# Patient Record
Sex: Male | Born: 1955 | Race: Black or African American | Hispanic: No | Marital: Single | Smoking: Former smoker
Health system: Southern US, Community
[De-identification: ages and names within clinical notes are randomized; demographics above are authoritative.]

## PROBLEM LIST (undated history)

## (undated) DIAGNOSIS — I10 Essential (primary) hypertension: Secondary | ICD-10-CM

## (undated) DIAGNOSIS — K7460 Unspecified cirrhosis of liver: Secondary | ICD-10-CM

## (undated) DIAGNOSIS — Z992 Dependence on renal dialysis: Secondary | ICD-10-CM

## (undated) DIAGNOSIS — N189 Chronic kidney disease, unspecified: Secondary | ICD-10-CM

## (undated) DIAGNOSIS — E785 Hyperlipidemia, unspecified: Secondary | ICD-10-CM

## (undated) DIAGNOSIS — K838 Other specified diseases of biliary tract: Secondary | ICD-10-CM

## (undated) DIAGNOSIS — G4730 Sleep apnea, unspecified: Secondary | ICD-10-CM

## (undated) DIAGNOSIS — B1920 Unspecified viral hepatitis C without hepatic coma: Secondary | ICD-10-CM

## (undated) DIAGNOSIS — K219 Gastro-esophageal reflux disease without esophagitis: Secondary | ICD-10-CM

## (undated) DIAGNOSIS — M109 Gout, unspecified: Secondary | ICD-10-CM

## (undated) DIAGNOSIS — I517 Cardiomegaly: Secondary | ICD-10-CM

## (undated) DIAGNOSIS — I471 Supraventricular tachycardia, unspecified: Secondary | ICD-10-CM

## (undated) DIAGNOSIS — N186 End stage renal disease: Secondary | ICD-10-CM

## (undated) HISTORY — PX: COLONOSCOPY: SHX174

## (undated) HISTORY — DX: Unspecified viral hepatitis C without hepatic coma: B19.20

## (undated) HISTORY — PX: BACK SURGERY: SHX140

## (undated) HISTORY — DX: Supraventricular tachycardia, unspecified: I47.10

## (undated) HISTORY — DX: Cardiomegaly: I51.7

## (undated) HISTORY — PX: LIVER BIOPSY: SHX301

## (undated) HISTORY — PX: FEMUR FRACTURE SURGERY: SHX633

## (undated) HISTORY — DX: Gastro-esophageal reflux disease without esophagitis: K21.9

## (undated) HISTORY — DX: Unspecified cirrhosis of liver: K74.60

## (undated) HISTORY — DX: Other specified diseases of biliary tract: K83.8

## (undated) HISTORY — DX: Chronic kidney disease, unspecified: N18.9

## (undated) HISTORY — PX: PERITONEAL CATHETER INSERTION: SHX2223

## (undated) HISTORY — DX: Hyperlipidemia, unspecified: E78.5

## (undated) HISTORY — DX: Supraventricular tachycardia: I47.1

---

## 2020-01-27 ENCOUNTER — Emergency Department (HOSPITAL_COMMUNITY): Payer: Medicaid Other

## 2020-01-27 ENCOUNTER — Other Ambulatory Visit: Payer: Self-pay

## 2020-01-27 ENCOUNTER — Encounter (HOSPITAL_COMMUNITY): Payer: Self-pay | Admitting: Emergency Medicine

## 2020-01-27 ENCOUNTER — Emergency Department (HOSPITAL_COMMUNITY)
Admission: EM | Admit: 2020-01-27 | Discharge: 2020-01-27 | Disposition: A | Discharge status: Home / Self Care | Payer: Medicaid Other | Attending: Emergency Medicine | Admitting: Emergency Medicine

## 2020-01-27 DIAGNOSIS — R1010 Upper abdominal pain, unspecified: Secondary | ICD-10-CM | POA: Diagnosis not present

## 2020-01-27 DIAGNOSIS — Z79899 Other long term (current) drug therapy: Secondary | ICD-10-CM | POA: Insufficient documentation

## 2020-01-27 DIAGNOSIS — R109 Unspecified abdominal pain: Secondary | ICD-10-CM | POA: Diagnosis present

## 2020-01-27 DIAGNOSIS — Z7982 Long term (current) use of aspirin: Secondary | ICD-10-CM | POA: Diagnosis not present

## 2020-01-27 DIAGNOSIS — I10 Essential (primary) hypertension: Secondary | ICD-10-CM | POA: Diagnosis not present

## 2020-01-27 HISTORY — DX: Essential (primary) hypertension: I10

## 2020-01-27 LAB — CBC WITH DIFFERENTIAL/PLATELET
Abs Immature Granulocytes: 0.02 10*3/uL (ref 0.00–0.07)
Basophils Absolute: 0 10*3/uL (ref 0.0–0.1)
Basophils Relative: 0 %
Eosinophils Absolute: 0.2 10*3/uL (ref 0.0–0.5)
Eosinophils Relative: 3 %
HCT: 45.1 % (ref 39.0–52.0)
Hemoglobin: 15 g/dL (ref 13.0–17.0)
Immature Granulocytes: 0 %
Lymphocytes Relative: 17 %
Lymphs Abs: 1.2 10*3/uL (ref 0.7–4.0)
MCH: 30.4 pg (ref 26.0–34.0)
MCHC: 33.3 g/dL (ref 30.0–36.0)
MCV: 91.5 fL (ref 80.0–100.0)
Monocytes Absolute: 0.8 10*3/uL (ref 0.1–1.0)
Monocytes Relative: 11 %
Neutro Abs: 4.6 10*3/uL (ref 1.7–7.7)
Neutrophils Relative %: 69 %
Platelets: 169 10*3/uL (ref 150–400)
RBC: 4.93 MIL/uL (ref 4.22–5.81)
RDW: 12.7 % (ref 11.5–15.5)
WBC: 6.8 10*3/uL (ref 4.0–10.5)
nRBC: 0 % (ref 0.0–0.2)

## 2020-01-27 LAB — URINALYSIS, ROUTINE W REFLEX MICROSCOPIC
Bilirubin Urine: NEGATIVE
Glucose, UA: NEGATIVE mg/dL
Hgb urine dipstick: NEGATIVE
Ketones, ur: NEGATIVE mg/dL
Leukocytes,Ua: NEGATIVE
Nitrite: NEGATIVE
Protein, ur: 100 mg/dL — AB
Specific Gravity, Urine: 1.013 (ref 1.005–1.030)
pH: 7 (ref 5.0–8.0)

## 2020-01-27 LAB — COMPREHENSIVE METABOLIC PANEL
ALT: 33 U/L (ref 0–44)
AST: 35 U/L (ref 15–41)
Albumin: 3.9 g/dL (ref 3.5–5.0)
Alkaline Phosphatase: 55 U/L (ref 38–126)
Anion gap: 10 (ref 5–15)
BUN: 24 mg/dL — ABNORMAL HIGH (ref 8–23)
CO2: 24 mmol/L (ref 22–32)
Calcium: 9.2 mg/dL (ref 8.9–10.3)
Chloride: 101 mmol/L (ref 98–111)
Creatinine, Ser: 1.87 mg/dL — ABNORMAL HIGH (ref 0.61–1.24)
GFR calc Af Amer: 44 mL/min — ABNORMAL LOW (ref >60)
GFR calc non Af Amer: 38 mL/min — ABNORMAL LOW (ref >60)
Glucose, Bld: 113 mg/dL — ABNORMAL HIGH (ref 70–99)
Potassium: 3.8 mmol/L (ref 3.5–5.1)
Sodium: 135 mmol/L (ref 135–145)
Total Bilirubin: 0.8 mg/dL (ref 0.3–1.2)
Total Protein: 8.7 g/dL — ABNORMAL HIGH (ref 6.5–8.1)

## 2020-01-27 LAB — LIPASE, BLOOD: Lipase: 61 U/L — ABNORMAL HIGH (ref 11–51)

## 2020-01-27 MED ORDER — PANTOPRAZOLE SODIUM 20 MG PO TBEC
20.0000 mg | DELAYED_RELEASE_TABLET | Freq: Every day | ORAL | 1 refills | Status: DC
Start: 2020-01-27 — End: 2020-02-01

## 2020-01-27 MED ORDER — IOHEXOL 300 MG/ML  SOLN
80.0000 mL | Freq: Once | INTRAMUSCULAR | Status: AC | PRN
  Administered 2020-01-27: 80 mL via INTRAVENOUS

## 2020-01-27 MED ORDER — PANTOPRAZOLE SODIUM 40 MG IV SOLR
40.0000 mg | Freq: Once | INTRAVENOUS | Status: AC
  Administered 2020-01-27: 15:00:00 40 mg via INTRAVENOUS
  Filled 2020-01-27: qty 40

## 2020-01-27 MED ORDER — ONDANSETRON HCL 4 MG/2ML IJ SOLN
4.0000 mg | Freq: Once | INTRAMUSCULAR | Status: AC
  Administered 2020-01-27: 15:00:00 4 mg via INTRAVENOUS
  Filled 2020-01-27: qty 2

## 2020-01-27 MED ORDER — SODIUM CHLORIDE 0.9 % IV BOLUS
1000.0000 mL | Freq: Once | INTRAVENOUS | Status: AC
  Administered 2020-01-27: 1000 mL via INTRAVENOUS

## 2020-01-27 MED ORDER — HYDROMORPHONE HCL 1 MG/ML IJ SOLN
1.0000 mg | Freq: Once | INTRAMUSCULAR | Status: AC
  Administered 2020-01-27: 15:00:00 1 mg via INTRAVENOUS
  Filled 2020-01-27: qty 1

## 2020-01-27 NOTE — ED Provider Notes (Signed)
Pt initially seen by Dr Roderic Palau.  Please see his note.  Labs reviewed.  Nl wbc.  Slight increase in lipase.  LFTs normal.  Cr increased.  UA without signs of infection.  CT scan findings  IMPRESSION:  1. Mild dilatation of the common bile duct measuring 10 mm.  Correlation with LFTs is recommended.  2. Nodular contour of the liver capsule compatible with cirrhosis.  3. Retroperitoneal lymphadenopathy, nonspecific.  4. Aortic Atherosclerosis (ICD10-I70.0).   Bile duct dilated but no abnormal lfts.  No acute findings to account for symptoms.  Will refer to GI as outpt.   Dorie Rank, MD 01/27/20 [phone]

## 2020-01-27 NOTE — Discharge Instructions (Signed)
The medications as prescribed.  Follow-up with a primary care doctor or GI doctor for further evaluation

## 2020-01-27 NOTE — ED Triage Notes (Signed)
Patient c/o abdominal pain for the past 4 days in the RUQ. Patient describes the pain as pressure.

## 2020-01-30 ENCOUNTER — Telehealth: Payer: Self-pay

## 2020-01-30 NOTE — Telephone Encounter (Signed)
Ok to schedule ov.  

## 2020-01-30 NOTE — Progress Notes (Signed)
Referring Provider:[place] [facility] Primary Care Physician:  Patient, No Pcp Per Primary Gastroenterologist:  Dr. Gala Romney  Chief Complaint  Patient presents with  . Abdominal Pain    Upper abd, x 1 week comes/goes. Went to ER 4/24. Started pantoprazole yesterday  . Constipation    will have some hard balls at time    HPI:   Joshua Maldonado is a 64 y.o. male presenting today at the request of [facility] for abdominal pain.   ED notes available were minimal. Suspect note may not be complete.  Reviewed labs and imaging.  Labs completed 01/27/2020: CBC within normal limits.  CMP remarkable for creatinine 1.87.  LFTs and electrolytes within normal limits.  Lipase slightly elevated at 61.  Urine analysis with rare bacteria.  CT A/P 01/27/2020 for upper abdominal pain x4 days: Impression: 1.  Mild dilation of the common bile duct measuring 10 mm. (Gallbladder unremarkable) 2.  Nodular contour of the liver capsule compatible with cirrhosis. 3.  Retroperitoneal lymphadenopathy, nonspecific. 4.  Atherosclerosis.  Today:   Abdominal Pain: Started 1 week ago. Upper abdomen, more in the epigastric area. Comes and goes. Through the day and night. Last an hour or so. Hasn't slept in 3-4 night. Gained about 30 lbs since November. Pain is sharp and pressure like. When eating, states it feels different. No dysphagia. States it is a full feeling. Sometimes he does have pain with eating. Will still have pain without eating. May go 2-3 hours without pain. 9/10 in severity. No nausea or vomiting. Reports feeling hot like a fever occasionally since abdominal pain started. Doesn't have a thermometer. Some chills. Some lightheadedness after trying to have a BM. No confusion or change in mental status. No yellowing of eyes. Urine is yellow. Decreased fluid intake. 1/2-3/4 liter a day. Eating once a day. Just started Protonix yesterday (prescribed by the ED). Felt pain worsened after taking the medication.   Takes  acetaminophen 3-4 times a day. 500 mg tablets. Taking this for abdominal pain. Helps. 400 mg Ibuprofen a couple times a day for about 3 months for back and hip pain.    Feels abdomen is distended. Has been bloated a couple months now. No lower abdominal pain.   History of heartburn/reflux. Occurs a couple times a week. On and off for years. Used to take prilosec in the past which worked well. Has been off of this for about 3-4 months.   Constipation: Used to have BMs 3 times a day now only having once a day if that. Change occurred 2-3 weeks ago. Stool are hard at times. Small balls at times. No blood in the stool. Had 1 dark stool 3 days ago but states it wasn't black. No pepto. No iron. Not taking anything to help with this.   Colonoscopy 2-3 years ago [facility] in [place]. Just moved to [facility] in November 2020.   Cirrhosis: Gained 30 lbs since November 2020. Bloating for the last couple of months. Admits to eating more and having a poor diet since moving to [place]. No swelling in LE. No bruising or bleeding. Was told he had cirrhosis a few years ago. History of Hep C. Treated with Harvoni about 3 years ago at [facility]. Hasn't been seen in about 3 years for this. Occasional alcohol use- a couple beers a week. Used to drink 6 pack a week about 1 year ago. No drug use. No known family history of liver disease or autoimmune conditions. No personal  history of autoimmune disease. Has had a liver biopsy at [facility]. Doesn't remember results.   No alcohol in the last week.   Past Medical History:  Diagnosis Date  . Cirrhosis ([place])    Per patient, diagnosed 3-4 years ago at [facility].  Reports liver biopsy at [facility].  Suspected to be secondary to hepatitis C.  . Hepatitis C    Per patient, s/p treatment with Harvoni at [facility]  . HLD (hyperlipidemia)   . Hypertension     Past Surgical History:  Procedure Laterality Date  . BACK SURGERY    . COLONOSCOPY    . LIVER BIOPSY       Current Outpatient Medications  Medication Sig Dispense Refill  . aspirin EC 81 MG tablet Take 81 mg by mouth daily.    [name] atorvastatin (LIPITOR) 10 MG tablet Take 10 mg by mouth daily.    [name] gabapentin (NEURONTIN) 600 MG tablet Take 600 mg by mouth 3 (three) times daily.    . hydrochlorothiazide (HYDRODIURIL) 25 MG tablet Take 25 mg by mouth daily.    [name] LISINOPRIL PO Take 1 tablet by mouth daily.    . pantoprazole (PROTONIX) 20 MG tablet Take 1 tablet (20 mg total) by mouth daily. 14 tablet 1   No current facility-administered medications for this visit.    Allergies as of 01/31/2020  . (No Known Allergies)    Family History  Problem Relation Age of Onset  . Colon cancer Neg Hx     Social History   Socioeconomic History  . Marital status: Single    Spouse name: Not on file  . Number of children: Not on file  . Years of education: Not on file  . Highest education level: Not on file  Occupational History  . Not on file  Tobacco Use  . Smoking status: Never Smoker  . Smokeless tobacco: Never Used  Substance and Sexual Activity  . Alcohol use: Yes    Alcohol/week: 5.0 standard drinks    Types: 3 Shots of liquor, 2 Cans of beer per week  . Drug use: Never  . Sexual activity: Not on file  Other Topics Concern  . Not on file  Social History Narrative  . Not on file   Social Determinants of Health   Financial Resource Strain:   . Difficulty of Paying Living Expenses:   Food Insecurity:   . Worried About Charity fundraiser in the Last Year:   . Arboriculturist in the Last Year:   Transportation Needs:   . Film/video editor (Medical):   [name] Lack of Transportation (Non-Medical):   Physical Activity:   . Days of Exercise per Week:   . Minutes of Exercise per Session:   Stress:   . Feeling of Stress :   Social Connections:   . Frequency of Communication with Friends and Family:   . Frequency of Social Gatherings with Friends and Family:   . Attends  Religious Services:   . Active Member of Clubs or Organizations:   . Attends Archivist Meetings:   [name] Marital Status:   Intimate Partner Violence:   . Fear of Current or Ex-Partner:   . Emotionally Abused:   [name] Physically Abused:   . Sexually Abused:     Review of Systems: Gen: See HPI CV: Denies chest pain.  Reports feeling his heart was beating fast last week when he was having pain. Resp: Denies shortness of breath at  rest.  Admits to shortness of breath with exertion.  No regular cough. GI: See HPI GU : Denies urinary burning, urinary frequency, urinary hesitancy MS: See HPI Psych: Denies depression or anxiety Heme: See HPI  Physical Exam: BP 140/86   Pulse 83   Temp (!) 97.3 F (36.3 C) (Oral)   Ht 5\' 7"  (1.702 m)   Wt 222 lb (100.7 kg)   BMI 34.77 kg/m  General:   Alert and oriented. Pleasant and cooperative. Well-nourished and well-developed.  No acute distress. Head:  Normocephalic and atraumatic. Eyes:  Without icterus, sclera clear and conjunctiva pink.  Ears:  Normal auditory acuity. Lungs:  Clear to auscultation bilaterally. No wheezes, rales, or rhonchi. No distress.  Heart:  S1, S2 present without murmurs appreciated.  Abdomen:  +BS.  Protuberant abdomen but soft.  Moderate to severe tenderness to palpation in the epigastric area and moderate tenderness to palpation in the RUQ. Minimal tenderness in the LUQ. No lower abdominal tenderness. No HSM noted. No guarding or rebound. No masses appreciated.  Rectal:  Deferred  Msk:  Symmetrical without gross deformities. Normal posture. Extremities:  Without edema. Neurologic:  Alert and  oriented x4;  grossly normal neurologically. Skin:  Intact without significant lesions or rashes. Psych: Normal mood and affect.

## 2020-01-30 NOTE — Telephone Encounter (Signed)
Pt is aware of OV tomorrow at 1030 with [facility]

## 2020-01-30 NOTE — Telephone Encounter (Signed)
Pt was seen in the ER and recommended to follow up with [place]. Pt has PCP appt on 5/12, but can't wait because of abd pain and wants to be seen by [place] asap. Can we accept him as a new patient

## 2020-01-31 ENCOUNTER — Other Ambulatory Visit: Payer: Self-pay

## 2020-01-31 ENCOUNTER — Telehealth: Payer: Self-pay

## 2020-01-31 ENCOUNTER — Ambulatory Visit: Payer: Medicaid Other | Admitting: Gastroenterology

## 2020-01-31 ENCOUNTER — Encounter: Payer: Self-pay | Admitting: Gastroenterology

## 2020-01-31 VITALS — BP 140/86 | HR 83 | Temp 97.3°F | Ht 67.0 in | Wt 222.0 lb

## 2020-01-31 DIAGNOSIS — R1010 Upper abdominal pain, unspecified: Secondary | ICD-10-CM | POA: Diagnosis not present

## 2020-01-31 DIAGNOSIS — R109 Unspecified abdominal pain: Secondary | ICD-10-CM | POA: Insufficient documentation

## 2020-01-31 DIAGNOSIS — K838 Other specified diseases of biliary tract: Secondary | ICD-10-CM

## 2020-01-31 DIAGNOSIS — K5900 Constipation, unspecified: Secondary | ICD-10-CM

## 2020-01-31 DIAGNOSIS — K7460 Unspecified cirrhosis of liver: Secondary | ICD-10-CM

## 2020-01-31 DIAGNOSIS — Z8619 Personal history of other infectious and parasitic diseases: Secondary | ICD-10-CM | POA: Insufficient documentation

## 2020-01-31 NOTE — Telephone Encounter (Addendum)
ASAP MRI/MRCP scheduled at [facility] for 02/11/20 (1st available) at 9:00am, arrive at 8:30am. NPO 4 hours before test. [facility] and tell them he's scheduled for outpatient MRI. (ASAP MRI scheduled at [facility] d/t [facility] is booked till end of May)  Called and informed pt of MRI appt.

## 2020-01-31 NOTE — Assessment & Plan Note (Addendum)
64 y.o. male who reports known history of cirrhosis for the last few years.  States this is secondary to hepatitis C which was treated with Harvoni at [facility] in [place], [place].  Reports hep C eradication.  Also reports having a liver biopsy.  Does not remember the results. Has not followed with anyone for cirrhosis in about 3 years.  Has never had an EGD.  Admits to drinking a couple beers a week.  Used to drink a sixpack a week.  Denies any history of drug use.  Not sure how he contracted hepatitis C.  No personal family history of autoimmune conditions or family history of liver disease.  Recent labs 01/27/2020 with LFTs and platelets within normal limits.  CT abdomen and pelvis 01/27/2020 for upper abdominal pain with nodular contour of the liver capsule compatible with cirrhosis, mild dilation of the CBD measuring 10 mm but gallbladder unremarkable.  No ascites.  Upper abdominal pain continues and as discussed below.  Admits to 30 pound weight gain since November 2020 and feels his abdomen is distended but attributes this to poor diet since moving to [place].  No swelling in the lower extremities, yellowing of the eyes, confusion, or easy bruising/bleeding.  On exam, his abdomen appears protuberant but is soft.  Tenderness palpation in the upper abdomen.  No lower extremity edema.  Request records from [facility] to review prior evaluation/treatments. Pursuing MRI/MRCP for upper abdominal pain/CBD dilation. Updating labs to calculate meld score. Updating HCVRNA to ensure eradication. Obtaining hepatitis A and B labs to evaluate for immunity. Advised to avoid alcohol for now. No more than 2 g of Tylenol daily. Low-sodium diet.  No more than 2 g daily. He will need upper endoscopy at some point to screen for esophageal varices.  This may also be needed to evaluate for his upper abdominal pain.  Will determine timing pending MRI results. Follow-up date to be determined following labs  and MRI.

## 2020-01-31 NOTE — Telephone Encounter (Signed)
MRI appt letter mailed to pt.

## 2020-01-31 NOTE — Assessment & Plan Note (Addendum)
Mild dilation of CBD measuring 10 mm on CT 01/27/2020.  Gallbladder unremarkable. Cirrhotic liver (chronic per patient). Normal appearing pancreas. No prior imaging to compare to.  Labs at that time with slight elevation of lipase at 61, LFTs within normal limits.CBC within normal limits. Patient reports new onset intermittent upper abdominal pain x1 week. 9/10 in severity.  Worsens with food but also occurs without eating. Denies nausea or vomiting.  Subjective intermittent fever at home but temperature is normal today.  Admits to drinking 1-2 beer a week. Ibuprofen 400 mg twice a day for about 3 months. Denies brbpr or melena.   Significance of biliary dilation as a contributor to his abdominal pain is not clear as his LFTs were within normal limits. However, query whether he has some microlithiasis or possibly a malignancy contributing to the dilation and upper abdominal pain. With slight elevation of lipase, can't rule out developing pancreatitis that may be related to the CBD dilation. He could very well have gastritis/PUD.   Pursue MRI/MRCP to evaluate CBD dilation.  May have to complete this without contrast due to elevated creatinine. Update CBC, CMP, lipase. Continue Protonix 20 mg daily for now (this was just started yesterday-prescribed by ED). May increase dosing pending labs and MRI.  Advised to follow a clear liquid diet for now. Focus on oral hydration. Drink enough water to keep urine pale yellow to clear.  Avoid all NSAIDs.  This includes ibuprofen, Aleve, Advil, and Goody powders. No alcohol for now.  Advised to proceed to the ED if he developed fever, worsening abdominal pain, or nausea/vomiting. Further recommendations to follow.

## 2020-01-31 NOTE — Assessment & Plan Note (Signed)
Patient reports history of hepatitis C s/p treatment with Harvoni through [facility] about 3 years ago.  States hepatitis was eradicated.  He does have cirrhosis which was presumed to be secondary to hep C.  Cirrhosis as addressed above.   Requesting records from [facility]. Updating HCVRNA to ensure eradication. Checking labs for hepatitis A and B immunity.

## 2020-01-31 NOTE — Progress Notes (Signed)
NO PCP PER PATIENT °

## 2020-01-31 NOTE — Patient Instructions (Addendum)
Please go to [facility] and have your blood work completed.  We are arranging for you to have an MRI of your abdomen to evaluate your dilated bile duct.  Continue taking Protonix 40 mg 30 minutes before breakfast.  Follow a clear liquid diet for now to see if this helps with your abdominal pain.  Focus on increasing your water intake.  Your kidney function was in the emergency room.  I have nothing to compare this to but it may be related to dehydration.  Avoid all NSAIDs.  This includes ibuprofen, Aleve, Advil, and Goody powders.  No alcohol for now.   May use Tylenol as needed.  Do not take more than 2 g daily due to your history of cirrhosis.  Follow a low-sodium diet.  No more than 2 g daily.  MiraLAX 1 capful (17 g) daily in 8 ounces of water.  I am requesting your records from [facility].  If you develop a fever, worsening abdominal pain, nausea or vomiting, you should proceed to the emergency room.  Further recommendations to follow labs and imaging.  Aliene Altes, PA-C [facility]

## 2020-01-31 NOTE — Assessment & Plan Note (Addendum)
Patient reports new onset intermittent upper abdominal pain x1 week. 9/10 in severity.  Worsens with food but also occurs without eating. Denies nausea or vomiting.  Subjective intermittent fever at home but temperature is normal today.  Admits to drinking 1-2 beer a week. Ibuprofen 400 mg twice a day for about 3 months. Chronic history of intermittent GERD for which he had been on prilosec but has been off PPI for 3-4 months. GERD symptoms 2-3 times a week. Denies brbpr or melena. Evaluation in the ED on 01/27/20. CT A/P with contrast revealed Mild dilation of CBD measuring 10 mm, Gallbladder unremarkable, Cirrhotic liver (chronic per patient), normal appearing pancreas, no acute bowel findings. No prior imaging to compare to.  Labs at that time with slight elevation of lipase at 61, LFTs within normal limits. CBC within normal limits.   Differentials include pain related to CBD dilation possibly secondary to microlithiasis, can't rule out malignancy. He may be developing pancreatitis secondary to etiology of dilated biliary tree. May also have gastritis/PUD secondary to NSAIDs.   Will start with MRI/MRCP ASAP to evaluate CBD dilation. May need reduced contrast due to elevated creatinine.  Ultimately he will need EGD for esophageal variceal screening and may need this sooner than later to evaluate his upper abdominal pain pending MRI findings.  Update CBC, CMP, and Lipase Continue Protonix 20 mg daily (this was just started yesterday-prescribed by ED). May increase dosing pending labs and MRI.  Advised to follow a clear liquid diet for now. Focus on oral hydration. Drink enough water to keep urine pale yellow to clear.  Avoid all NSAIDs.  This includes ibuprofen, Aleve, Advil, and Goody powders. No alcohol for now.  Advised to proceed to the ED if he developed fever, worsening abdominal pain, or nausea/vomiting. Further recommendations to follow.

## 2020-01-31 NOTE — Assessment & Plan Note (Signed)
New onset of constipation about 3 weeks ago.  Went from having 3 BMs daily to 1 BM daily.  Stools are hard at times, require straining, and passes small stool balls.  Denies bright red blood per rectum.  Reports 1 dark stool but no black stool.  No unintentional weight loss.  No lower abdominal pain.  He does have new onset upper abdominal pain x1 week with decreased oral intake which may be influencing constipation.  Upper abdominal pain discussed below.  Reports colonoscopy 2-3 years ago at [facility] in [place].  Recent CT abdomen pelvis for upper abdominal pain with no concerning colonic findings.  Etiology of constipation is unclear.  May be influenced by decreased oral intake.  As he is without any alarm symptoms, has significant upper GI issues at this time, and has had recent colonoscopy, will plan to treat symptomatically for now.  Start MiraLAX 1 capful (a milligrams) daily in 8 ounces of water. Increase daily water intake.  Drink enough water to keep urine pale yellow to clear. We will determine follow-up pending labs and MRI for upper abdominal pain as discussed below.

## 2020-01-31 NOTE — Telephone Encounter (Signed)
PA for MRI/MRCP submitted via [facility] website. Case approved. PA# [id number], valid 01/31/20-07/29/20.

## 2020-02-01 ENCOUNTER — Telehealth: Payer: Self-pay | Admitting: Internal Medicine

## 2020-02-01 ENCOUNTER — Encounter: Payer: Self-pay | Admitting: *Deleted

## 2020-02-01 ENCOUNTER — Other Ambulatory Visit: Payer: Self-pay | Admitting: Gastroenterology

## 2020-02-01 DIAGNOSIS — R1010 Upper abdominal pain, unspecified: Secondary | ICD-10-CM

## 2020-02-01 MED ORDER — PANTOPRAZOLE SODIUM 40 MG PO TBEC: 40.0000 mg | DELAYED_RELEASE_TABLET | Freq: Two times a day (BID) | ORAL | 3 refills | Status: DC

## 2020-02-01 NOTE — Progress Notes (Signed)
Glucose is elevated at 261. Creatinine elevated at 1.84. I have nothing to compare this to. Patient denied any history of kidney disease at last visit.   He needs to establish with a PCP ASAP to have further evaluation/management of possible diabetes/kidney disease.   He is immune to hepatitis A.  Borderline immunity to hepatitis B.  Hepatitis C RNA is pending.  Liver function test within normal limits.  Lipase has increased compared to 5 days ago but not high enough to be diagnostic of acute pancreatitis.  This may be secondary to kidney disease or possibly peptic ulcer disease.  Cannot rule out contribution from dilated CBD for which he is currently scheduled for MRI; however, LFTs are within normal limits which makes this less likely.   For now, we will increase his Protonix to 40 mg twice daily to treat for possible PUD.  We will also go ahead and get him scheduled for an upper endoscopy to evaluate his upper abdominal pain as well as screen for esophageal varices.   Discussed results and recommendations with patient. Reported prior history of diabetes that had resolved. States he has appointment with [facility] 02/14/20 and will discuss kidney function and possible diabetes with them. He was agreeable to increasing protonix. Will send in Rx. He is also agreeable to EGD. The risks, benefits, and alternatives have been discussed in detail with patient.   [facility] Clinical Pool: Please arrange EGD with propofol with [facility]. Dx: Epigastric pain, cirrhosis.

## 2020-02-01 NOTE — Telephone Encounter (Signed)
Spoke with pt. Pt states his abdominal pain is severe compared to his pain on yesterday. Pt was asked if he is following a clear liquid diet, if he had a fever, severe abdominal pain. Pt stated he broke out into a sweat when he had a BM. Pt wasn't sure if he had a fever but his pain was worsening. Pt was advised to go to the ED if he developed any of the symptoms mentioned per [facility] at his ov.

## 2020-02-01 NOTE — Telephone Encounter (Signed)
Pt was seen in office yesterday by [facility]. He said he was still having abd pain. [phone]

## 2020-02-01 NOTE — Telephone Encounter (Signed)
Noted. Agree with recommendations to proceed to the ED for worsening abdominal pain, fever, nausea /vomiting. I had started MiraLAX 1 capful (17g) daily in 8 oz of water at his OV for constipation. Has he started this? This should start helping with hard stools and straining soon if it hasn't already.

## 2020-02-02 LAB — CBC WITH DIFFERENTIAL/PLATELET
Absolute Monocytes: 119 cells/uL — ABNORMAL LOW (ref 200–950)
Basophils Absolute: 7 cells/uL (ref 0–200)
Basophils Relative: 0.1 %
Eosinophils Absolute: 7 cells/uL — ABNORMAL LOW (ref 15–500)
Eosinophils Relative: 0.1 %
HCT: 44.2 % (ref 38.5–50.0)
Hemoglobin: 14.8 g/dL (ref 13.2–17.1)
Lymphs Abs: 770 cells/uL — ABNORMAL LOW (ref 850–3900)
MCH: 30.5 pg (ref 27.0–33.0)
MCHC: 33.5 g/dL (ref 32.0–36.0)
MCV: 91.1 fL (ref 80.0–100.0)
MPV: 11.5 fL (ref 7.5–12.5)
Monocytes Relative: 1.7 %
Neutro Abs: 6097 cells/uL (ref 1500–7800)
Neutrophils Relative %: 87.1 %
Platelets: 168 10*3/uL (ref 140–400)
RBC: 4.85 10*6/uL (ref 4.20–5.80)
RDW: 12.7 % (ref 11.0–15.0)
Total Lymphocyte: 11 %
WBC: 7 10*3/uL (ref 3.8–10.8)

## 2020-02-02 LAB — COMPLETE METABOLIC PANEL WITH GFR
AG Ratio: 1.1 (calc) (ref 1.0–2.5)
ALT: 25 U/L (ref 9–46)
AST: 24 U/L (ref 10–35)
Albumin: 4.1 g/dL (ref 3.6–5.1)
Alkaline phosphatase (APISO): 55 U/L (ref 35–144)
BUN/Creatinine Ratio: 15 (calc) (ref 6–22)
BUN: 27 mg/dL — ABNORMAL HIGH (ref 7–25)
CO2: 23 mmol/L (ref 20–32)
Calcium: 9.4 mg/dL (ref 8.6–10.3)
Chloride: 99 mmol/L (ref 98–110)
Creat: 1.84 mg/dL — ABNORMAL HIGH (ref 0.70–1.25)
GFR, Est African American: 45 mL/min/{1.73_m2} — ABNORMAL LOW (ref >=60)
GFR, Est Non African American: 39 mL/min/{1.73_m2} — ABNORMAL LOW (ref >=60)
Globulin: 3.7 g/dL (calc) (ref 1.9–3.7)
Glucose, Bld: 261 mg/dL — ABNORMAL HIGH (ref 65–139)
Potassium: 4.4 mmol/L (ref 3.5–5.3)
Sodium: 134 mmol/L — ABNORMAL LOW (ref 135–146)
Total Bilirubin: 0.5 mg/dL (ref 0.2–1.2)
Total Protein: 7.8 g/dL (ref 6.1–8.1)

## 2020-02-02 LAB — HEPATITIS B SURFACE ANTIBODY,QUALITATIVE: Hep B S Ab: BORDERLINE — AB

## 2020-02-02 LAB — HEPATITIS B SURFACE ANTIGEN: Hepatitis B Surface Ag: NONREACTIVE

## 2020-02-02 LAB — LIPASE: Lipase: 133 U/L — ABNORMAL HIGH (ref 7–60)

## 2020-02-02 LAB — PROTIME-INR
INR: 1
Prothrombin Time: 11.2 s (ref 9.0–11.5)

## 2020-02-02 LAB — HEPATITIS A ANTIBODY, TOTAL: Hepatitis A AB,Total: REACTIVE — AB

## 2020-02-02 LAB — HEPATITIS C RNA QUANTITATIVE
HCV Quantitative Log: <1.18 Log IU/mL
HCV RNA, PCR, QN: <15 IU/mL

## 2020-02-02 LAB — AFP TUMOR MARKER: AFP-Tumor Marker: 4.9 ng/mL (ref <6.1)

## 2020-02-02 NOTE — Telephone Encounter (Signed)
Spoke with pt. Pt is aware of [facility] recommendations. Pt started the Miralax 1 capful with 8 oz of water. Pt has had a small BM but feels he needs to completely empty out.

## 2020-02-02 NOTE — Telephone Encounter (Signed)
Noted. He should continue with MiraLAX daily.   If he wants to try a MiraLAX purge he can.  1 capful MiraLAX (17g) in 8 oz of water every hour up to 5 times.  Ok to stop before the 5th time if his bowels are moving well.

## 2020-02-02 NOTE — Telephone Encounter (Signed)
Left a detailed message for pt. Pt was advised to call back if needed.

## 2020-02-02 NOTE — Progress Notes (Signed)
Hep C RNA not detected.

## 2020-02-04 ENCOUNTER — Other Ambulatory Visit: Payer: Self-pay

## 2020-02-04 ENCOUNTER — Emergency Department (HOSPITAL_COMMUNITY)
Admission: EM | Admit: 2020-02-04 | Discharge: 2020-02-04 | Disposition: A | Discharge status: Home / Self Care | Payer: Medicaid Other | Attending: Emergency Medicine | Admitting: Emergency Medicine

## 2020-02-04 ENCOUNTER — Emergency Department (HOSPITAL_COMMUNITY): Payer: Medicaid Other

## 2020-02-04 ENCOUNTER — Encounter (HOSPITAL_COMMUNITY): Payer: Self-pay | Admitting: *Deleted

## 2020-02-04 ENCOUNTER — Telehealth: Payer: Self-pay | Admitting: Gastroenterology

## 2020-02-04 DIAGNOSIS — R1013 Epigastric pain: Secondary | ICD-10-CM | POA: Diagnosis present

## 2020-02-04 DIAGNOSIS — Z79899 Other long term (current) drug therapy: Secondary | ICD-10-CM | POA: Diagnosis not present

## 2020-02-04 DIAGNOSIS — I10 Essential (primary) hypertension: Secondary | ICD-10-CM | POA: Insufficient documentation

## 2020-02-04 DIAGNOSIS — Z7982 Long term (current) use of aspirin: Secondary | ICD-10-CM | POA: Insufficient documentation

## 2020-02-04 LAB — CBC WITH DIFFERENTIAL/PLATELET
Abs Immature Granulocytes: 0.03 10*3/uL (ref 0.00–0.07)
Basophils Absolute: 0 10*3/uL (ref 0.0–0.1)
Basophils Relative: 0 %
Eosinophils Absolute: 0.2 10*3/uL (ref 0.0–0.5)
Eosinophils Relative: 3 %
HCT: 44.8 % (ref 39.0–52.0)
Hemoglobin: 15.3 g/dL (ref 13.0–17.0)
Immature Granulocytes: 0 %
Lymphocytes Relative: 16 %
Lymphs Abs: 1.3 10*3/uL (ref 0.7–4.0)
MCH: 30.8 pg (ref 26.0–34.0)
MCHC: 34.2 g/dL (ref 30.0–36.0)
MCV: 90.3 fL (ref 80.0–100.0)
Monocytes Absolute: 0.6 10*3/uL (ref 0.1–1.0)
Monocytes Relative: 7 %
Neutro Abs: 5.9 10*3/uL (ref 1.7–7.7)
Neutrophils Relative %: 74 %
Platelets: 180 10*3/uL (ref 150–400)
RBC: 4.96 MIL/uL (ref 4.22–5.81)
RDW: 12.2 % (ref 11.5–15.5)
WBC: 8.1 10*3/uL (ref 4.0–10.5)
nRBC: 0 % (ref 0.0–0.2)

## 2020-02-04 LAB — HEPATIC FUNCTION PANEL
ALT: 31 U/L (ref 0–44)
AST: 32 U/L (ref 15–41)
Albumin: 4 g/dL (ref 3.5–5.0)
Alkaline Phosphatase: 67 U/L (ref 38–126)
Bilirubin, Direct: 0.1 mg/dL (ref 0.0–0.2)
Indirect Bilirubin: 0.5 mg/dL (ref 0.3–0.9)
Total Bilirubin: 0.6 mg/dL (ref 0.3–1.2)
Total Protein: 8.9 g/dL — ABNORMAL HIGH (ref 6.5–8.1)

## 2020-02-04 LAB — BASIC METABOLIC PANEL
Anion gap: 7 (ref 5–15)
BUN: 17 mg/dL (ref 8–23)
CO2: 25 mmol/L (ref 22–32)
Calcium: 9 mg/dL (ref 8.9–10.3)
Chloride: 101 mmol/L (ref 98–111)
Creatinine, Ser: 1.51 mg/dL — ABNORMAL HIGH (ref 0.61–1.24)
GFR calc Af Amer: 57 mL/min — ABNORMAL LOW (ref >60)
GFR calc non Af Amer: 49 mL/min — ABNORMAL LOW (ref >60)
Glucose, Bld: 109 mg/dL — ABNORMAL HIGH (ref 70–99)
Potassium: 3.9 mmol/L (ref 3.5–5.1)
Sodium: 133 mmol/L — ABNORMAL LOW (ref 135–145)

## 2020-02-04 LAB — LIPASE, BLOOD: Lipase: 115 U/L — ABNORMAL HIGH (ref 11–51)

## 2020-02-04 MED ORDER — HYDROCODONE-ACETAMINOPHEN 5-325 MG PO TABS: 2.0000 | ORAL_TABLET | Freq: Four times a day (QID) | ORAL | 0 refills | Status: DC | PRN

## 2020-02-04 MED ORDER — FENTANYL CITRATE (PF) 100 MCG/2ML IJ SOLN
50.0000 ug | Freq: Once | INTRAMUSCULAR | Status: AC
  Administered 2020-02-04: 14:00:00 50 ug via INTRAVENOUS
  Filled 2020-02-04: qty 2

## 2020-02-04 MED ORDER — PANTOPRAZOLE SODIUM 40 MG IV SOLR
40.0000 mg | Freq: Once | INTRAVENOUS | Status: AC
  Administered 2020-02-04: 40 mg via INTRAVENOUS
  Filled 2020-02-04: qty 40

## 2020-02-04 MED ORDER — HYDROMORPHONE HCL 1 MG/ML IJ SOLN
1.0000 mg | Freq: Once | INTRAMUSCULAR | Status: AC
  Administered 2020-02-04: 1 mg via INTRAVENOUS
  Filled 2020-02-04: qty 1

## 2020-02-04 NOTE — Discharge Instructions (Addendum)
Please follow-up with your gastroenterology PA regarding today's encounter and for ongoing evaluation and management.  I have prescribed you a short course of Vicodin.  Please only take for significant pain symptoms as this can exacerbate your symptoms of constipation.  Return to the ED or seek immediate medical attention should you experience any new or worsening symptoms.  You were given narcotic and or sedative medications while in the emergency department. Do not drive. Do not use machinery or power tools. Do not sign legal documents. Do not drink alcohol. Do not take sleeping pills. Do not supervise children by yourself. Do not participate in activities that require climbing or being in high places.

## 2020-02-04 NOTE — Telephone Encounter (Signed)
Received records from [facility] with dates ranging from 10/12/2013-09/26/2015.  -States patient has biopsy-proven cirrhosis likely secondary to hep C. -Hepatitis C genotype 1a failing treatment with telaprevir, pegylated interferon, and ribavirin.  Subsequently treated with Harvoni for 24 weeks starting 09/26/2013 and completed 03/20/2014 -Completed hepatitis A and B vaccine July 2015. -Reports patient had EGD in July 2011 without varices.  Colonoscopy in 2010 with colonic polyps. -Also following with [facility] (not sure if prior EGD/TCS was completed there).   No EGD/TCS report received. No results from Liver Biopsy received.   Manuela Schwartz can you request TCS/EGD and liver biopsy results from [facility] in [place], [place] as I am not sure where these procedures were performed.

## 2020-02-04 NOTE — ED Triage Notes (Signed)
Pt with upper abd pain across for 2 weeks, seen for same last weekend.  Pt has been taking miralax for constipation and having very small BM's.  LBM today but very small.

## 2020-02-04 NOTE — ED Provider Notes (Signed)
[facility] EMERGENCY DEPARTMENT Provider Note   CSN: [id number] Arrival date & time: 01/27/20  1032     History Chief Complaint  Patient presents with  . Abdominal Pain    Joshua Maldonado is a 64 y.o. male.   Abdominal Pain Associated symptoms: no chest pain, no cough, no diarrhea, no fatigue and no hematuria        Past Medical History:  Diagnosis Date  . Cirrhosis ([place])    Per patient, diagnosed 3-4 years ago at [facility]  Reports liver biopsy at [facility].  Suspected to be secondary to hepatitis C.  . Hepatitis C    Per patient, s/p treatment with Harvoni at [facility]  . HLD (hyperlipidemia)   . Hypertension     Patient Active Problem List   Diagnosis Date Noted  . History of hepatitis C 01/31/2020  . Cirrhosis of liver without ascites ([place]) 01/31/2020  . Dilation of biliary tract 01/31/2020  . Pain of upper abdomen 01/31/2020  . Constipation 01/31/2020    Past Surgical History:  Procedure Laterality Date  . BACK SURGERY    . COLONOSCOPY    . LIVER BIOPSY         Family History  Problem Relation Age of Onset  . Colon cancer Neg Hx     Social History   Tobacco Use  . Smoking status: Never Smoker  . Smokeless tobacco: Never Used  Substance Use Topics  . Alcohol use: Yes    Alcohol/week: 5.0 standard drinks    Types: 3 Shots of liquor, 2 Cans of beer per week  . Drug use: Never    Home Medications Prior to Admission medications   Medication Sig Start Date End Date Taking? Authorizing Provider  aspirin EC 81 MG tablet Take 81 mg by mouth daily.    [provider]  atorvastatin (LIPITOR) 10 MG tablet Take 10 mg by mouth daily.    [provider]  gabapentin (NEURONTIN) 600 MG tablet Take 600 mg by mouth 3 (three) times daily.    [provider]  hydrochlorothiazide (HYDRODIURIL) 25 MG tablet Take 25 mg by mouth daily.    [provider]  LISINOPRIL PO Take 1 tablet by mouth daily.    [provider]  pantoprazole (PROTONIX) 40 MG tablet Take 1 tablet (40 mg total) by mouth 2 (two) times daily before a meal. 02/01/20 01/31/21  Erenest Rasher, PA-C    Allergies    Patient has no known allergies.  Review of Systems   Review of Systems  Constitutional: Negative for appetite change and fatigue.  HENT: Negative for congestion, ear discharge and sinus pressure.   Eyes: Negative for discharge.  Respiratory: Negative for cough.   Cardiovascular: Negative for chest pain.  Gastrointestinal: Positive for abdominal pain. Negative for diarrhea.  Genitourinary: Negative for frequency and hematuria.  Musculoskeletal: Negative for back pain.  Skin: Negative for rash.  Neurological: Negative for seizures and headaches.  Psychiatric/Behavioral: Negative for hallucinations.    Physical Exam Updated Vital Signs BP (!) 140/104   Pulse 70   Temp 98.7 F (37.1 C) (Oral)   Resp 18   Ht 5\' 7"  (1.702 m)   Wt 90.7 kg   SpO2 97%   BMI 31.32 kg/m   Physical Exam Vitals reviewed.  Constitutional:      Appearance: He is well-developed.  HENT:     Head: Normocephalic.     Nose: Nose normal.  Eyes:  General: No scleral icterus.    Conjunctiva/sclera: Conjunctivae normal.  Neck:     Thyroid: No thyromegaly.  Cardiovascular:     Rate and Rhythm: Normal rate and regular rhythm.     Heart sounds: No murmur. No friction rub. No gallop.   Pulmonary:     Breath sounds: No stridor. No wheezing or rales.  Chest:     Chest wall: No tenderness.  Abdominal:     General: There is no distension.     Tenderness: There is abdominal tenderness. There is no rebound.     Comments: Mild epigastric tenderness  Musculoskeletal:        General: Normal range of motion.     Cervical back: Neck supple.  Lymphadenopathy:     Cervical: No cervical adenopathy.  Skin:    Findings: No erythema or rash.  Neurological:     Mental Status: He is alert and oriented to person, place, and time.      Motor: No abnormal muscle tone.     Coordination: Coordination normal.  Psychiatric:        Behavior: Behavior normal.     ED Results / Procedures / Treatments   Labs (all labs ordered are listed, but only abnormal results are displayed) Labs Reviewed  COMPREHENSIVE METABOLIC PANEL - Abnormal; Notable for the following components:      Result Value   Glucose, Bld 113 (*)    BUN 24 (*)    Creatinine, Ser 1.87 (*)    Total Protein 8.7 (*)    GFR calc non Af Amer 38 (*)    GFR calc Af Amer 44 (*)    All other components within normal limits  LIPASE, BLOOD - Abnormal; Notable for the following components:   Lipase 61 (*)    All other components within normal limits  URINALYSIS, ROUTINE W REFLEX MICROSCOPIC - Abnormal; Notable for the following components:   Protein, ur 100 (*)    Bacteria, UA RARE (*)    All other components within normal limits  CBC WITH DIFFERENTIAL/PLATELET    EKG None  Radiology No results found.  Procedures Procedures (including critical care time)  Medications Ordered in ED Medications  HYDROmorphone (DILAUDID) injection 1 mg (1 mg Intravenous Given 01/27/20 1451)  ondansetron (ZOFRAN) injection 4 mg (4 mg Intravenous Given 01/27/20 1453)  sodium chloride 0.9 % bolus 1,000 mL (0 mLs Intravenous Stopped 01/27/20 1714)  pantoprazole (PROTONIX) injection 40 mg (40 mg Intravenous Given 01/27/20 1459)  iohexol (OMNIPAQUE) 300 MG/ML solution 80 mL (80 mLs Intravenous Contrast Given 01/27/20 1537)    ED Course  I have reviewed the triage vital signs and the nursing notes.  Pertinent labs & imaging results that were available during my care of the patient were reviewed by me and considered in my medical decision making (see chart for details).    MDM Rules/Calculators/A&P                     Patient with history of cirrhosis.  Recent CT scan shows no acute problems.  Patient improved with Protonix.  He will be sent home with Protonix and follow-up with  his GI doctor    This patient presents to the ED for concern of abdominal pain, this involves an extensive number of treatment options, and is a complaint that carries with it a high risk of complications and morbidity.  The differential diagnosis includes cirrhosis GERD   Lab Tests:   I Ordered, reviewed, and  interpreted labs, which included CBC chemistries and lipase which showed mild elevated lipase  Medicines ordered:   I ordered medication Dilaudid and Protonix for abdominal pain.  Patient improved  Imaging Studies ordered:   Additional history obtained:   Additional history obtained from records  Previous records obtained and reviewed   Consultations Obtained:   Reevaluation:  After the interventions stated above, I reevaluated the patient and found improved  Critical Interventions:  .   Final Clinical Impression(s) / ED Diagnoses Final diagnoses:  Abdominal pain, unspecified abdominal location    Rx / DC Orders ED Discharge Orders         Ordered    pantoprazole (PROTONIX) 20 MG tablet  Daily,   Status:  Discontinued     01/27/20 1704           Milton Ferguson, MD 02/04/20 [phone]

## 2020-02-04 NOTE — ED Provider Notes (Signed)
[facility] EMERGENCY DEPARTMENT Provider Note   CSN: [id number] Arrival date & time: 02/04/20  1257     History Chief Complaint  Patient presents with  . Abdominal Pain    Joshua Maldonado is a 64 y.o. male with past medical history significant for hepatitis C, cirrhosis, HTN, HLD presents to the ED with a 2-week history of ongoing upper abdominal pain.  I reviewed patient's medical record and he was evaluated 01/27/2020 and CT abdomen pelvis with contrast was obtained and revealed no acute intra-abdominal pathology.  He improved with Dilaudid and Protonix.  CBD mildly dilated to 10 mm and contour of liver was consistent with cirrhosis.  Patient was seen by gastroenterology, Trellis Moment, and was concern for developing pancreatitis due to biliary tree or gastritis/PUD secondary to NSAID use.  Plan was for urgent MRI/MRCP to evaluate his CBD dilation further.  This is currently scheduled for 02/11/2020.  Advised to continue PPIs and stool softeners as well as increase hydration while avoiding NSAIDs.  On my examination patient states that his pain is about the same as when he came to the ER last week.  He continues to endorse early satiety and states that he has gained approximately 30 pounds in the past 6 months.  He has been having 1 small nonbloody bowel movement each day as opposed to 2-3 large bowel movements that he was having prior to his onset of symptoms.  He denies any headache or dizziness, fevers or chills, chest pain, nausea or vomiting, urinary symptoms, melena or hematochezia, or other symptoms.  HPI     Past Medical History:  Diagnosis Date  . Cirrhosis ([place])    Per patient, diagnosed 3-4 years ago at [facility].  Reports liver biopsy at [facility].  Suspected to be secondary to hepatitis C.  . Hepatitis C    Per patient, s/p treatment with Harvoni at [facility]  . HLD (hyperlipidemia)   . Hypertension     Patient Active Problem List   Diagnosis Date Noted  . History of  hepatitis C 01/31/2020  . Cirrhosis of liver without ascites ([place]) 01/31/2020  . Dilation of biliary tract 01/31/2020  . Pain of upper abdomen 01/31/2020  . Constipation 01/31/2020    Past Surgical History:  Procedure Laterality Date  . BACK SURGERY    . COLONOSCOPY    . LIVER BIOPSY         Family History  Problem Relation Age of Onset  . Colon cancer Neg Hx     Social History   Tobacco Use  . Smoking status: Never Smoker  . Smokeless tobacco: Never Used  Substance Use Topics  . Alcohol use: Yes    Alcohol/week: 5.0 standard drinks    Types: 3 Shots of liquor, 2 Cans of beer per week  . Drug use: Never    Home Medications Prior to Admission medications   Medication Sig Start Date End Date Taking? Authorizing Provider  aspirin EC 81 MG tablet Take 81 mg by mouth daily.   Yes [provider]  atorvastatin (LIPITOR) 10 MG tablet Take 10 mg by mouth daily.   Yes [provider]  gabapentin (NEURONTIN) 600 MG tablet Take 600 mg by mouth 3 (three) times daily.   Yes [provider]  hydrochlorothiazide (HYDRODIURIL) 25 MG tablet Take 25 mg by mouth daily.   Yes [provider]  lisinopril (ZESTRIL) 20 MG tablet Take 1 tablet by mouth daily.    Yes [provider]  pantoprazole (PROTONIX) 40 MG tablet Take 1 tablet (40 mg total) by mouth 2 (two) times daily before a meal. 02/01/20 01/31/21 Yes Harper, Kristen S, PA-C  polyethylene glycol (MIRALAX / GLYCOLAX) 17 g packet Take 17 g by mouth 2 (two) times daily.   Yes [provider]  HYDROcodone-acetaminophen (NORCO/VICODIN) 5-325 MG tablet Take 2 tablets by mouth every 6 (six) hours as needed for severe pain. 02/04/20   Corena Herter, PA-C    Allergies    Patient has no known allergies.  Review of Systems   Review of Systems  All other systems reviewed and are negative.   Physical Exam Updated Vital Signs BP (!) 158/110 (BP Location: Left Arm)   Pulse 70   Temp  97.9 F (36.6 C) (Oral)   Resp 15   Ht 5\' 7"  (1.702 m)   Wt 100.2 kg   SpO2 99%   BMI 34.61 kg/m   Physical Exam Vitals and nursing note reviewed. Exam conducted with a chaperone present.  Constitutional:      Appearance: Normal appearance.  HENT:     Head: Normocephalic and atraumatic.  Eyes:     General: No scleral icterus.    Conjunctiva/sclera: Conjunctivae normal.  Cardiovascular:     Rate and Rhythm: Normal rate and regular rhythm.     Pulses: Normal pulses.     Heart sounds: Normal heart sounds.  Pulmonary:     Effort: Pulmonary effort is normal. No respiratory distress.     Breath sounds: Normal breath sounds.     Comments: Mildly increased work of breathing. Abdominal:     Comments: Soft, mildly distended.  Significant TTP over epigastrium.  Mild TTP in RUQ.  No TTP elsewhere.  No guarding.  No overlying skin changes.  Normoactive bowel sounds.  Musculoskeletal:     Cervical back: Normal range of motion. No rigidity.     Right lower leg: No edema.     Left lower leg: No edema.  Skin:    General: Skin is dry.     Capillary Refill: Capillary refill takes less than 2 seconds.  Neurological:     Mental Status: He is alert and oriented to person, place, and time.     GCS: GCS eye subscore is 4. GCS verbal subscore is 5. GCS motor subscore is 6.  Psychiatric:        Mood and Affect: Mood normal.        Behavior: Behavior normal.        Thought Content: Thought content normal.     ED Results / Procedures / Treatments   Labs (all labs ordered are listed, but only abnormal results are displayed) Labs Reviewed  LIPASE, BLOOD - Abnormal; Notable for the following components:      Result Value   Lipase 115 (*)    All other components within normal limits  HEPATIC FUNCTION PANEL - Abnormal; Notable for the following components:   Total Protein 8.9 (*)    All other components within normal limits  BASIC METABOLIC PANEL - Abnormal; Notable for the following  components:   Sodium 133 (*)    Glucose, Bld 109 (*)    Creatinine, Ser 1.51 (*)    GFR calc non Af Amer 49 (*)    GFR calc Af Amer 57 (*)    All other components within normal limits  CBC WITH DIFFERENTIAL/PLATELET    EKG None  Radiology DG Chest Portable 1 View  Result Date: 02/04/2020 CLINICAL DATA:  Increased shortness of breath EXAM: PORTABLE CHEST 1 VIEW COMPARISON:  None. FINDINGS: Low lung volumes. Likely chronic mild interstitial prominence. No pleural effusion or pneumothorax. Cardiomediastinal contours are within normal limits technique. IMPRESSION: No acute process in the chest. Electronically Signed   By: Macy Mis M.D.   On: 02/04/2020 14:33    Procedures Procedures (including critical care time)  Medications Ordered in ED Medications  pantoprazole (PROTONIX) injection 40 mg (40 mg Intravenous Given 02/04/20 1424)  fentaNYL (SUBLIMAZE) injection 50 mcg (50 mcg Intravenous Given 02/04/20 1425)  HYDROmorphone (DILAUDID) injection 1 mg (1 mg Intravenous Given 02/04/20 1534)    ED Course  I have reviewed the triage vital signs and the nursing notes.  Pertinent labs & imaging results that were available during my care of the patient were reviewed by me and considered in my medical decision making (see chart for details).    MDM Rules/Calculators/A&P                      Patient reports that his pain symptoms are not any worse than what they were when he first came to the ER for evaluation approximately 1 week ago.  He was hoping that by coming here today he could potentially expedite his MRCP studies.  He is taking his medications at home, as directed.  He has an excellent relationship with his gastroenterology physician assistant who has been engaging in communication with him on a regular basis.  He states that he is still able to tolerate food and liquid by mouth, however endorses early satiety.  He is mildly hyponatremic to 133, but otherwise there is no significant  electrolyte derangement in his BMP.  He still demonstrates mildly impaired renal function, however much improved when compared to prior labs.  His hepatic function panel was surprisingly unremarkable and consistent with his prior labs.  While his lipase is elevated at 115, it is improved from 133 labs obtained a few days ago.  CBC remains entirely unremarkable.  His vital signs aside from elevated blood pressure, presumably due to his pain symptoms, are WNL.  We will prescribe patient a short course of pain medication, however I encouraged him to only take if absolutely necessary as it can exacerbate any symptoms of constipation.  Otherwise, encourage him to follow-up with his gastroenterology clinic first thing tomorrow morning to discuss whether or not his subsequent imaging can be expedited.  Do not feel as though repeat CT is warranted.  Patient agrees and is reassured by today's laboratory work-up.  Discussed with Dr. Rogene Houston who agrees with assessment and plan.  Strict ED return precautions discussed.  All of the evaluation and work-up results were discussed with the patient and any family at bedside. They were provided opportunity to ask any additional questions and have none at this time. They have expressed understanding of verbal discharge instructions as well as return precautions and are agreeable to the plan.   Final Clinical Impression(s) / ED Diagnoses Final diagnoses:  Epigastric pain    Rx / DC Orders ED Discharge Orders         Ordered    HYDROcodone-acetaminophen (NORCO/VICODIN) 5-325 MG tablet  Every 6 hours PRN     02/04/20 1615           Corena Herter, PA-C 02/04/20 1617    Fredia Sorrow, MD 02/08/20 2009

## 2020-02-06 ENCOUNTER — Telehealth: Payer: Self-pay | Admitting: Internal Medicine

## 2020-02-06 NOTE — Telephone Encounter (Signed)
Called pt and he has been provided # to central scheduling. He is scheduled for Sunday for MRI.

## 2020-02-06 NOTE — Telephone Encounter (Signed)
Patient called and said he was seen in the er for his symptoms and they suggested he call here and try and get his MRI moved to sooner.

## 2020-02-11 ENCOUNTER — Other Ambulatory Visit: Payer: Self-pay

## 2020-02-11 ENCOUNTER — Other Ambulatory Visit: Payer: Self-pay | Admitting: Gastroenterology

## 2020-02-11 ENCOUNTER — Ambulatory Visit (HOSPITAL_COMMUNITY)
Admission: RE | Admit: 2020-02-11 | Discharge: 2020-02-11 | Disposition: A | Discharge status: Home / Self Care | Payer: Medicaid Other | Source: Ambulatory Visit | Attending: Gastroenterology | Admitting: Gastroenterology

## 2020-02-11 DIAGNOSIS — R1010 Upper abdominal pain, unspecified: Secondary | ICD-10-CM

## 2020-02-11 DIAGNOSIS — K838 Other specified diseases of biliary tract: Secondary | ICD-10-CM

## 2020-02-11 NOTE — Progress Notes (Signed)
MRI completed to evaluate for CBD dilation noted on prior CT. Dilation is favored to represent a benign variant. No obstruction lesion within the CBD on MRI.   Do not suspect this is contributing to his upper abdominal pain. More likely gastritis or possible PUD secondary to ibuprofen as discussed prior. He should continue Protonix 40 mg BID and avoid all NSAIDs and alcohol for now.

## 2020-02-14 ENCOUNTER — Other Ambulatory Visit: Payer: Self-pay | Admitting: Gastroenterology

## 2020-02-14 DIAGNOSIS — R1010 Upper abdominal pain, unspecified: Secondary | ICD-10-CM

## 2020-02-14 MED ORDER — SUCRALFATE 1 G PO TABS: 1.0000 g | ORAL_TABLET | Freq: Three times a day (TID) | ORAL | 1 refills | Status: DC

## 2020-02-20 NOTE — Telephone Encounter (Signed)
Requested last TCS/EGD and path from both places.

## 2020-03-21 ENCOUNTER — Telehealth: Payer: Self-pay

## 2020-03-21 NOTE — Telephone Encounter (Signed)
Received a call from [facility] records. They didn't receive the full request for pts records. If records are still needed, you can call [phone] or fax [phone].

## 2020-04-03 NOTE — Patient Instructions (Signed)
42    Your procedure is scheduled on: 04/09/2020  Report to [facility] at   11:45  AM.  Call this number if you have problems the morning of surgery: [phone]   Remember:   Do not drink or eat food:After Midnight.        No Smoking the day of procedure      Take these medicines the morning of surgery with A SIP OF WATER: Pantoprazole and gabapentin   Do not wear jewelry, make-up or nail polish.  Do not wear lotions, powders, or perfumes. You may wear deodorant.                Do not bring valuables to the hospital.  Contacts, dentures or bridgework may not be worn into surgery.  Leave suitcase in the car. After surgery it may be brought to your room.  For patients admitted to the hospital, checkout time is 11:00 AM the day of discharge.   Patients discharged the day of surgery will not be allowed to drive home. Upper Endoscopy, Adult Upper endoscopy is a procedure to look inside the upper GI (gastrointestinal) tract. The upper GI tract is made up of:  The part of the body that moves food from your mouth to your stomach (esophagus).  The stomach.  The first part of your small intestine (duodenum). This procedure is also called esophagogastroduodenoscopy (EGD) or gastroscopy. In this procedure, your health care provider passes a thin, flexible tube (endoscope) through your mouth and down your esophagus into your stomach. A small camera is attached to the end of the tube. Images from the camera appear on a monitor in the exam room. During this procedure, your health care provider may also remove a small piece of tissue to be sent to a lab and examined under a microscope (biopsy). Your health care provider may do an upper endoscopy to diagnose cancers of the upper GI tract. You may also have this procedure to find the cause of other conditions, such as:  Stomach pain.  Heartburn.  Pain or problems when swallowing.  Nausea and vomiting.  Stomach bleeding.  Stomach ulcers. Tell a  health care provider about:  Any allergies you have.  All medicines you are taking, including vitamins, herbs, eye drops, creams, and over-the-counter medicines.  Any problems you or family members have had with anesthetic medicines.  Any blood disorders you have.  Any surgeries you have had.  Any medical conditions you have.  Whether you are pregnant or may be pregnant. What are the risks? Generally, this is a safe procedure. However, problems may occur, including:  Infection.  Bleeding.  Allergic reactions to medicines.  A tear or hole (perforation) in the esophagus, stomach, or duodenum. What happens before the procedure? Staying hydrated Follow instructions from your health care provider about hydration, which may include:  Up to 2 hours before the procedure - you may continue to drink clear liquids, such as water, clear fruit juice, black coffee, and plain tea.  Medicines Ask your health care provider about:  Changing or stopping your regular medicines. This is especially important if you are taking diabetes medicines or blood thinners.  Taking medicines such as aspirin and ibuprofen. These medicines can thin your blood. Do not take these medicines unless your health care provider tells you to take them.  Taking over-the-counter medicines, vitamins, herbs, and supplements. General instructions  Plan to have someone take you home from the hospital or clinic.  If you will be  going home right after the procedure, plan to have someone with you for 24 hours.  Ask your health care provider what steps will be taken to help prevent infection. What happens during the procedure?  1. An IV will be inserted into one of your veins. 2. You may be given one or more of the following: ? A medicine to help you relax (sedative). ? A medicine to numb the throat (local anesthetic). 3. You will lie on your left side on an exam table. 4. Your health care provider will pass the  endoscope through your mouth and down your esophagus. 5. Your health care provider will use the scope to check the inside of your esophagus, stomach, and duodenum. Biopsies may be taken. 6. The endoscope will be removed. The procedure may vary among health care providers and hospitals. What happens after the procedure?  Your blood pressure, heart rate, breathing rate, and blood oxygen level will be monitored until you leave the hospital or clinic.  Do not drive for 24 hours if you were given a sedative during your procedure.  When your throat is no longer numb, you may be given some fluids to drink.  It is up to you to get the results of your procedure. Ask your health care provider, or the department that is doing the procedure, when your results will be ready. Summary  Upper endoscopy is a procedure to look inside the upper GI tract.  During the procedure, an IV will be inserted into one of your veins. You may be given a medicine to help you relax.  A medicine will be used to numb your throat.  The endoscope will be passed through your mouth and down your esophagus. This information is not intended to replace advice given to you by your health care provider. Make sure you discuss any questions you have with your health care provider. Document Revised: 03/16/2018 Document Reviewed: 02/21/2018 Elsevier Patient Education  [place] After  Please read the instructions outlined below and refer to this sheet in the next few weeks. These discharge instructions provide you with general information on caring for yourself after you leave the hospital. Your doctor may also give you specific instructions. While your treatment has been planned according to the most current medical practices available, unavoidable complications occasionally  occur. If you have any problems or questions after discharge, please call your doctor. HOME CARE INSTRUCTIONS Activity  You may resume your regular activity but move at a slower pace for the next 24 hours.   Take frequent rest periods for the next 24 hours.   Walking will help expel (get rid of) the air and reduce the bloated feeling in your abdomen.  No driving for 24 hours (because of the anesthesia (medicine) used during the test).   You may shower.   Do not sign any important legal documents or operate any machinery for 24 hours (because of the anesthesia used during the test).  Nutrition  Drink plenty of fluids.   You may resume your normal diet.   Begin with a light meal and progress to your normal diet.   Avoid alcoholic beverages for 24 hours or as instructed by your caregiver.  Medications You may resume your normal medications unless your caregiver tells you otherwise. What you can expect today  You may experience abdominal discomfort such as a feeling of fullness or "gas" pains.   You may experience a sore throat for 2 to 3 days. This is normal. Gargling with salt water may help this.  Follow-up Your doctor will discuss the results of your test with you. SEEK IMMEDIATE MEDICAL CARE IF:  You have excessive nausea (feeling sick to your stomach) and/or vomiting.   You have severe abdominal pain and distention (swelling).   You have trouble swallowing.   You have a temperature over 100 F (37.8 C).   You have rectal bleeding or vomiting of blood.  Document Released: 05/05/2004 Document Revised: 09/10/2011 Document Reviewed: 11/16/2007

## 2020-04-05 ENCOUNTER — Encounter (HOSPITAL_COMMUNITY): Payer: Self-pay

## 2020-04-05 ENCOUNTER — Encounter (HOSPITAL_COMMUNITY)
Admission: RE | Admit: 2020-04-05 | Discharge: 2020-04-05 | Disposition: A | Discharge status: Home / Self Care | Payer: Medicaid Other | Source: Ambulatory Visit | Attending: Internal Medicine | Admitting: Internal Medicine

## 2020-04-05 ENCOUNTER — Other Ambulatory Visit (HOSPITAL_COMMUNITY)
Admission: RE | Admit: 2020-04-05 | Discharge: 2020-04-05 | Disposition: A | Discharge status: Home / Self Care | Payer: Medicaid Other | Source: Ambulatory Visit | Attending: Internal Medicine | Admitting: Internal Medicine

## 2020-04-05 ENCOUNTER — Telehealth: Payer: Self-pay | Admitting: *Deleted

## 2020-04-05 ENCOUNTER — Other Ambulatory Visit: Payer: Self-pay

## 2020-04-05 DIAGNOSIS — Z01818 Encounter for other preprocedural examination: Secondary | ICD-10-CM | POA: Diagnosis not present

## 2020-04-05 DIAGNOSIS — Z20822 Contact with and (suspected) exposure to covid-19: Secondary | ICD-10-CM | POA: Diagnosis not present

## 2020-04-05 HISTORY — DX: Sleep apnea, unspecified: G47.30

## 2020-04-05 LAB — COMPREHENSIVE METABOLIC PANEL
ALT: 35 U/L (ref 0–44)
AST: 37 U/L (ref 15–41)
Albumin: 4 g/dL (ref 3.5–5.0)
Alkaline Phosphatase: 51 U/L (ref 38–126)
Anion gap: 11 (ref 5–15)
BUN: 24 mg/dL — ABNORMAL HIGH (ref 8–23)
CO2: 22 mmol/L (ref 22–32)
Calcium: 9.3 mg/dL (ref 8.9–10.3)
Chloride: 106 mmol/L (ref 98–111)
Creatinine, Ser: 1.76 mg/dL — ABNORMAL HIGH (ref 0.61–1.24)
GFR calc Af Amer: 47 mL/min — ABNORMAL LOW (ref >60)
GFR calc non Af Amer: 41 mL/min — ABNORMAL LOW (ref >60)
Glucose, Bld: 121 mg/dL — ABNORMAL HIGH (ref 70–99)
Potassium: 3.9 mmol/L (ref 3.5–5.1)
Sodium: 139 mmol/L (ref 135–145)
Total Bilirubin: 0.7 mg/dL (ref 0.3–1.2)
Total Protein: 7.9 g/dL (ref 6.5–8.1)

## 2020-04-05 LAB — SARS CORONAVIRUS 2 (TAT 6-24 HRS): SARS Coronavirus 2: NEGATIVE

## 2020-04-05 NOTE — Telephone Encounter (Signed)
Called pre-service center back and LMOVM making aware and also patient was scheduled back in April prior to new Medicaid taking effect.

## 2020-04-05 NOTE — Telephone Encounter (Signed)
Received called from pre-service center that patient requires PA for EGD scheduled for 7/6. Patient has [place].  Leith for PA. This requires clinical Review. Ref# [id number]. I have uploaded clinicals for review. Also request has been marked as urgent!

## 2020-04-08 ENCOUNTER — Ambulatory Visit (HOSPITAL_COMMUNITY): Payer: Medicaid Other | Admitting: Anesthesiology

## 2020-04-09 ENCOUNTER — Other Ambulatory Visit: Payer: Self-pay

## 2020-04-09 ENCOUNTER — Telehealth: Payer: Self-pay | Admitting: Emergency Medicine

## 2020-04-09 ENCOUNTER — Encounter (HOSPITAL_COMMUNITY)
Admission: RE | Disposition: A | Discharge status: Home / Self Care | Payer: Self-pay | Source: Home / Self Care | Attending: Internal Medicine

## 2020-04-09 ENCOUNTER — Telehealth: Payer: Self-pay | Admitting: *Deleted

## 2020-04-09 ENCOUNTER — Encounter (HOSPITAL_COMMUNITY): Payer: Self-pay | Admitting: Internal Medicine

## 2020-04-09 ENCOUNTER — Ambulatory Visit (HOSPITAL_COMMUNITY)
Admission: RE | Admit: 2020-04-09 | Discharge: 2020-04-09 | Disposition: A | Discharge status: Home / Self Care | Payer: Medicaid Other | Attending: Internal Medicine | Admitting: Internal Medicine

## 2020-04-09 ENCOUNTER — Encounter: Payer: Self-pay | Admitting: Internal Medicine

## 2020-04-09 DIAGNOSIS — Z9119 Patient's noncompliance with other medical treatment and regimen: Secondary | ICD-10-CM | POA: Diagnosis not present

## 2020-04-09 DIAGNOSIS — G4730 Sleep apnea, unspecified: Secondary | ICD-10-CM | POA: Insufficient documentation

## 2020-04-09 DIAGNOSIS — Z7982 Long term (current) use of aspirin: Secondary | ICD-10-CM | POA: Insufficient documentation

## 2020-04-09 DIAGNOSIS — Z539 Procedure and treatment not carried out, unspecified reason: Secondary | ICD-10-CM | POA: Insufficient documentation

## 2020-04-09 DIAGNOSIS — Z418 Encounter for other procedures for purposes other than remedying health state: Secondary | ICD-10-CM | POA: Insufficient documentation

## 2020-04-09 DIAGNOSIS — Z419 Encounter for procedure for purposes other than remedying health state, unspecified: Secondary | ICD-10-CM | POA: Diagnosis present

## 2020-04-09 DIAGNOSIS — I10 Essential (primary) hypertension: Secondary | ICD-10-CM | POA: Insufficient documentation

## 2020-04-09 DIAGNOSIS — K7460 Unspecified cirrhosis of liver: Secondary | ICD-10-CM | POA: Insufficient documentation

## 2020-04-09 DIAGNOSIS — E785 Hyperlipidemia, unspecified: Secondary | ICD-10-CM | POA: Insufficient documentation

## 2020-04-09 DIAGNOSIS — Z79899 Other long term (current) drug therapy: Secondary | ICD-10-CM | POA: Insufficient documentation

## 2020-04-09 DIAGNOSIS — B1920 Unspecified viral hepatitis C without hepatic coma: Secondary | ICD-10-CM | POA: Insufficient documentation

## 2020-04-09 LAB — PROTIME-INR
INR: 1 (ref 0.8–1.2)
Prothrombin Time: 12.4 seconds (ref 11.4–15.2)

## 2020-04-09 SURGERY — ESOPHAGOGASTRODUODENOSCOPY (EGD) WITH PROPOFOL
Anesthesia: Monitor Anesthesia Care

## 2020-04-09 MED ORDER — CHLORHEXIDINE GLUCONATE CLOTH 2 % EX PADS: 6.0000 | MEDICATED_PAD | Freq: Once | CUTANEOUS | Status: DC

## 2020-04-09 MED ORDER — GLYCOPYRROLATE 0.2 MG/ML IJ SOLN: 0.2000 mg | Freq: Once | INTRAMUSCULAR | Status: DC

## 2020-04-09 MED ORDER — PROPOFOL 10 MG/ML IV BOLUS
INTRAVENOUS | Status: AC
  Filled 2020-04-09: qty 40

## 2020-04-09 MED ORDER — LACTATED RINGERS IV SOLN: Freq: Once | INTRAVENOUS | Status: AC

## 2020-04-09 MED ORDER — LIDOCAINE VISCOUS HCL 2 % MT SOLN: 15.0000 mL | Freq: Once | OROMUCOSAL | Status: DC

## 2020-04-09 NOTE — Telephone Encounter (Signed)
noted 

## 2020-04-09 NOTE — OR Nursing (Signed)
Mr. Joshua Maldonado reported to Dr. Consuella Lose that he had coffee with creamer at 10 am the day of procedure. He stated he had about 6-8 ounces. Dr. Consuella Lose talked with Dr. Gala Romney and the patient was told the procedure would need to be postponed. Joshua Maldonado was told he could call the office and reschedule his procedure.

## 2020-04-09 NOTE — Telephone Encounter (Signed)
Scheduled patient and sent letter  

## 2020-04-09 NOTE — Telephone Encounter (Signed)
Per RMR patient did not follow instructions for EGD today. Procedure cancelled. He needs to come back to the office for face-to-face meeting with whomever can ensure that he understands instructions and get him rescheduled    Please schedule Joshua Maldonado thanks

## 2020-04-09 NOTE — Telephone Encounter (Signed)
See prior note patient needs OV per RMR prior to rescheduling procedure. Patient aware

## 2020-04-09 NOTE — Telephone Encounter (Signed)
PA has been approved. Dates 04/09/2020-07/08/2020. Auth# [id number]

## 2020-04-09 NOTE — H&P (Signed)
@LOGO @   Primary Care Physician:  Patient, No Pcp Per Primary Gastroenterologist:  Dr. Gala Romney  Pre-Procedure History & Physical: HPI:  Joshua Maldonado is a 64 y.o. male here for EGD.  Unfortunately, patient did not stay n.p.o. past midnight.  Procedure will be canceled and rescheduled.  Past Medical History:  Diagnosis Date   Cirrhosis ([place])    Per patient, diagnosed 3-4 years ago at [facility].  Reports liver biopsy at [facility].  Suspected to be secondary to hepatitis C.   Hepatitis C    Per patient, s/p treatment with Harvoni at [place] (hyperlipidemia)    Hypertension    Sleep apnea     Past Surgical History:  Procedure Laterality Date   BACK SURGERY     COLONOSCOPY     LIVER BIOPSY      Prior to Admission medications   Medication Sig Start Date End Date Taking? Authorizing Provider  aspirin EC 81 MG tablet Take 81 mg by mouth daily.   Yes [provider]  atorvastatin (LIPITOR) 10 MG tablet Take 10 mg by mouth daily.   Yes [provider]  gabapentin (NEURONTIN) 600 MG tablet Take 600 mg by mouth 3 (three) times daily.   Yes [provider]  hydrochlorothiazide (HYDRODIURIL) 25 MG tablet Take 25 mg by mouth daily.   Yes [provider]  HYDROcodone-acetaminophen (NORCO/VICODIN) 5-325 MG tablet Take 2 tablets by mouth every 6 (six) hours as needed for severe pain. 02/04/20  Yes Corena Herter, PA-C  lisinopril (ZESTRIL) 20 MG tablet Take 20 mg by mouth daily.    Yes [provider]  pantoprazole (PROTONIX) 40 MG tablet Take 1 tablet (40 mg total) by mouth 2 (two) times daily before a meal. 02/01/20 01/31/21 Yes Harper, Kristen S, PA-C  sucralfate (CARAFATE) 1 g tablet Take 1 tablet (1 g total) by mouth 4 (four) times daily -  with meals and at bedtime. 02/14/20 02/13/21 Yes Erenest Rasher, PA-C  Vitamin D, Ergocalciferol, (DRISDOL) 1.25 MG (50000 UNIT) CAPS capsule Take 50,000 Units by mouth every 7 (seven) days.   Yes  [provider]    Allergies as of 02/01/2020   (No Known Allergies)    Family History  Problem Relation Age of Onset   Colon cancer Neg Hx     Social History   Socioeconomic History   Marital status: Single    Spouse name: Not on file   Number of children: Not on file   Years of education: Not on file   Highest education level: Not on file  Occupational History   Not on file  Tobacco Use   Smoking status: Never Smoker   Smokeless tobacco: Never Used  Substance and Sexual Activity   Alcohol use: Yes    Alcohol/week: 5.0 standard drinks    Types: 3 Shots of liquor, 2 Cans of beer per week   Drug use: Never   Sexual activity: Not on file  Other Topics Concern   Not on file  Social History Narrative   Not on file   Social Determinants of Health   Financial Resource Strain:    Difficulty of Paying Living Expenses:   Food Insecurity:    Worried About Charity fundraiser in the Last Year:    Arboriculturist in the Last Year:   Transportation Needs:    Film/video editor (Medical):    Lack of Transportation (Non-Medical):   Physical Activity:  Days of Exercise per Week:    Minutes of Exercise per Session:   Stress:    Feeling of Stress :   Social Connections:    Frequency of Communication with Friends and Family:    Frequency of Social Gatherings with Friends and Family:    Attends Religious Services:    Active Member of Clubs or Organizations:    Attends Music therapist:    Marital Status:   Intimate Partner Violence:    Fear of Current or Ex-Partner:    Emotionally Abused:    Physically Abused:    Sexually Abused:     Review of Systems: See HPI, otherwise negative ROS  Impression/Plan: For EGD today.  Unfortunately, patient noncompliant and did not stay n.p.o.  We will cancel the procedure and reschedule.     Notice: This dictation was prepared with Dragon dictation along with smaller phrase technology. Any  transcriptional errors that result from this process are unintentional and may not be corrected upon review.

## 2020-04-09 NOTE — Telephone Encounter (Signed)
Pt called stated he had a procedure scheduled for today but his EGD was cancelled because he drank something and he was not suppose to. I notified pt that it maybe a  weeks before someone can reach out to him to reschedule it because our new provider does not start until next month and that there are several people on a waiting list . But as soon as someone is able to reschedule it they will. Pt voiced understanding

## 2020-04-23 ENCOUNTER — Other Ambulatory Visit (HOSPITAL_COMMUNITY): Payer: Self-pay | Admitting: Neurology

## 2020-04-23 DIAGNOSIS — M545 Low back pain, unspecified: Secondary | ICD-10-CM

## 2020-04-28 ENCOUNTER — Other Ambulatory Visit: Payer: Self-pay | Admitting: Gastroenterology

## 2020-04-28 DIAGNOSIS — R1010 Upper abdominal pain, unspecified: Secondary | ICD-10-CM

## 2020-05-10 ENCOUNTER — Ambulatory Visit (HOSPITAL_COMMUNITY)
Admission: RE | Admit: 2020-05-10 | Discharge: 2020-05-10 | Disposition: A | Discharge status: Home / Self Care | Payer: Medicaid Other | Source: Ambulatory Visit | Attending: Neurology | Admitting: Neurology

## 2020-05-10 ENCOUNTER — Other Ambulatory Visit: Payer: Self-pay

## 2020-05-10 DIAGNOSIS — M545 Low back pain, unspecified: Secondary | ICD-10-CM

## 2020-05-30 ENCOUNTER — Ambulatory Visit (INDEPENDENT_AMBULATORY_CARE_PROVIDER_SITE_OTHER): Payer: Medicaid Other | Admitting: Nurse Practitioner

## 2020-05-30 ENCOUNTER — Encounter: Payer: Self-pay | Admitting: Nurse Practitioner

## 2020-05-30 ENCOUNTER — Other Ambulatory Visit: Payer: Self-pay

## 2020-05-30 VITALS — BP 170/98 | HR 85 | Temp 98.4°F | Ht 67.0 in | Wt 230.8 lb

## 2020-05-30 DIAGNOSIS — R1010 Upper abdominal pain, unspecified: Secondary | ICD-10-CM

## 2020-05-30 DIAGNOSIS — K7460 Unspecified cirrhosis of liver: Secondary | ICD-10-CM

## 2020-05-30 NOTE — Progress Notes (Signed)
Referring Provider: No ref. provider found Primary Care Physician:  [facility], [facility] Primary GI:  Dr. Gala Romney  Chief Complaint  Patient presents with  . Follow-up    schedule EGD    HPI:   Joshua Maldonado is a 64 y.o. male who presents to reschedule EGD.  The patient was last seen in our office 01/31/20 for upper abdominal pain, hepatitis C, cirrhosis, biliary tract dilation, constipation.  Patient was recently in [facility], ED with normal CBC, CMP.  Creatinine mildly elevated at 1.87, LFTs and electrolytes normal, lipase slightly elevated at 61.  CT of the abdomen and pelvis found mild dilation of the CBD measuring 10 mm with unremarkable gallbladder, nodular contour of the liver capsule compatible with cirrhosis, retroperitoneal lymphadenopathy.  At last visit noted upper abdominal pain for a week without nausea or vomiting, drinks 1-2 beers a week and ibuprofen 400 mg twice a day for about 3 months.  Intermittent GERD on Prilosec, but has been off PPI for 3 to 4 months.  Recommended MRI/MRCP to evaluate CBD dilation, will need EGD for variceal screening and MRI pain.  Update labs, continue PPI, avoid all NSAIDs, no alcohol.  MRI/MRCP completed 02/11/2020 which found CBD dilation found to favor a benign variant without obstruction noted, unlikely contributing to his symptoms.  Labs completed 01/31/2020 including CBC, CMP, INR, AFP, lipase, viral hepatitis serologies.  LFTs normal, lipase mildly elevated, creatinine mildly elevated at 1.84, hepatitis C RNA negative.  Recommended increase Protonix to twice daily.  Recommended EGD.  EGD was scheduled for 04/09/2020.  However, it was canceled because patient did not follow correct instructions.  Requested face-to-face visit to reschedule.  Today he states he is doing okay overall. He states he had coffee the morning of his procedure with cream. States his abdominal pain is much better, none recently. Denies N/V, hematochezia, fever, chills,  unintentional weight loss. Did have one episode of black stools a couple months ago but none since. Denies URI or flu-like symptoms. Denies loss of sense of taste or smell. The patient has received COVID-19 vaccination(s). Denies chest pain, dyspnea, dizziness, lightheadedness, syncope, near syncope. Denies any other upper or lower GI symptoms.  States last colonoscopy was about 5-6 years ago at [facility]; thinks they may have told him to repeat in 3-5 years, but isn't certain.  Past Medical History:  Diagnosis Date  . Cirrhosis ([place])    Per patient, diagnosed 3-4 years ago at [facility].  Reports liver biopsy at [facility].  Suspected to be secondary to hepatitis C.  . Hepatitis C    Per patient, s/p treatment with Harvoni at [facility]  . HLD (hyperlipidemia)   . Hypertension   . Sleep apnea     Past Surgical History:  Procedure Laterality Date  . BACK SURGERY    . COLONOSCOPY    . LIVER BIOPSY      Current Outpatient Medications  Medication Sig Dispense Refill  . aspirin EC 81 MG tablet Take 81 mg by mouth daily.    [name] atorvastatin (LIPITOR) 10 MG tablet Take 10 mg by mouth daily.    [name] gabapentin (NEURONTIN) 600 MG tablet Take 600 mg by mouth 3 (three) times daily.    . hydrochlorothiazide (HYDRODIURIL) 25 MG tablet Take 25 mg by mouth daily.    [name] lisinopril (ZESTRIL) 20 MG tablet Take 20 mg by mouth daily.     . pantoprazole (PROTONIX) 40 MG tablet Take 1 tablet (40 mg  total) by mouth 2 (two) times daily before a meal. 60 tablet 3  . sucralfate (CARAFATE) 1 g tablet TAKE 1 TABLET (1 G TOTAL) BY MOUTH 4 (FOUR) TIMES DAILY - WITH MEALS AND AT BEDTIME. (Patient taking differently: Take 1 g by mouth daily. ) 120 tablet 1  . Vitamin D, Ergocalciferol, (DRISDOL) 1.25 MG (50000 UNIT) CAPS capsule Take 50,000 Units by mouth every 7 (seven) days.     No current facility-administered medications for this visit.    Allergies as of 05/30/2020  . (No Known Allergies)     Family History  Problem Relation Age of Onset  . Colon cancer Neg Hx     Social History   Socioeconomic History  . Marital status: Single    Spouse name: Not on file  . Number of children: Not on file  . Years of education: Not on file  . Highest education level: Not on file  Occupational History  . Not on file  Tobacco Use  . Smoking status: Never Smoker  . Smokeless tobacco: Never Used  Substance and Sexual Activity  . Alcohol use: Yes    Alcohol/week: 5.0 standard drinks    Types: 2 Cans of beer, 3 Shots of liquor per week    Comment: 3 drinks a week  . Drug use: Never  . Sexual activity: Not on file  Other Topics Concern  . Not on file  Social History Narrative  . Not on file   Social Determinants of Health   Financial Resource Strain:   . Difficulty of Paying Living Expenses: Not on file  Food Insecurity:   . Worried About Charity fundraiser in the Last Year: Not on file  . Ran Out of Food in the Last Year: Not on file  Transportation Needs:   . Lack of Transportation (Medical): Not on file  . Lack of Transportation (Non-Medical): Not on file  Physical Activity:   . Days of Exercise per Week: Not on file  . Minutes of Exercise per Session: Not on file  Stress:   . Feeling of Stress : Not on file  Social Connections:   . Frequency of Communication with Friends and Family: Not on file  . Frequency of Social Gatherings with Friends and Family: Not on file  . Attends Religious Services: Not on file  . Active Member of Clubs or Organizations: Not on file  . Attends Archivist Meetings: Not on file  . Marital Status: Not on file    Subjective: Review of Systems  Constitutional: Negative for chills, fever, malaise/fatigue and weight loss.  HENT: Positive for congestion (history of allergies; improved with Flonase). Negative for sore throat.   Respiratory: Negative for cough and shortness of breath.   Cardiovascular: Negative for chest pain and  palpitations.  Gastrointestinal: Negative for abdominal pain, blood in stool, diarrhea, melena, nausea and vomiting.  Musculoskeletal: Negative for joint pain and myalgias.  Skin: Negative for rash.  Neurological: Negative for dizziness and weakness.  Endo/Heme/Allergies: Does not bruise/bleed easily.  Psychiatric/Behavioral: Negative for depression. The patient is not nervous/anxious.   All other systems reviewed and are negative.    Objective: BP (!) 170/98   Pulse 85   Temp 98.4 F (36.9 C) (Oral)   Ht _0  (1.702 m)   Wt 230 lb 12.8 oz (104.7 kg)   BMI 36.15 kg/m  Physical Exam Vitals and nursing note reviewed.  Constitutional:      General: He is not in  acute distress.    Appearance: Normal appearance. He is obese. He is not ill-appearing, toxic-appearing or diaphoretic.  HENT:     Head: Normocephalic and atraumatic.     Nose: No congestion or rhinorrhea.  Eyes:     General: No scleral icterus. Cardiovascular:     Rate and Rhythm: Normal rate and regular rhythm.     Heart sounds: Normal heart sounds.  Pulmonary:     Effort: Pulmonary effort is normal.     Breath sounds: Normal breath sounds.  Abdominal:     General: Bowel sounds are normal. There is no distension.     Palpations: Abdomen is soft. There is no hepatomegaly, splenomegaly or mass.     Tenderness: There is no abdominal tenderness. There is no guarding or rebound.     Hernia: No hernia is present.  Musculoskeletal:     Cervical back: Neck supple.  Skin:    General: Skin is warm and dry.     Coloration: Skin is not jaundiced.     Findings: No bruising or rash.  Neurological:     General: No focal deficit present.     Mental Status: He is alert and oriented to person, place, and time. Mental status is at baseline.  Psychiatric:        Mood and Affect: Mood normal.        Behavior: Behavior normal.        Thought Content: Thought content normal.      Assessment:  Pleasant 64 year old male with  history of cirrhosis due to hepatitis C status post treatment with Harvoni was previously seen with abdominal pain in the upper abdomen and need for variceal screening.  Since starting PPI his abdominal pain has improved significantly.  However, given his cirrhosis (despite normal platelet count) he should be screened for esophageal varices.  No other overt GI complaints at this time.  We will proceed with rescheduling his EGD and recommended he follow his instructions closely.  Proceed with EGD on propofol/MAC with Dr. Gala Romney in near future: the risks, benefits, and alternatives have been discussed with the patient in detail. The patient states understanding and desires to proceed.  The patient is currently on Neurontin. The patient is not on any other anticoagulants, anxiolytics, chronic pain medications, antidepressants, antidiabetics, or iron supplements.  He has cut back on alcohol and drinks about 3 drinks a week.  We will plan for the procedure on propofol/MAC to promote adequate sedation.  ASA III (htn, BMI)   Plan: 1. EGD on propofol 2. We will request previous colonoscopy records from [facility] approximately 5 to 6 years ago 3. Further recommendations to follow 4. Follow-up in 4 months to establish routine liver care   Thank you for allowing [place] to participate in the care of Gertie Exon, DNP, AGNP-C Adult & Gerontological Nurse Practitioner [facility]   05/30/2020 8:55 AM   Disclaimer: This note was dictated with voice recognition software. Similar sounding words can inadvertently be transcribed and may not be corrected upon review.

## 2020-05-30 NOTE — Patient Instructions (Addendum)
Your health issues we discussed today were:   Cirrhosis: 1. Call us if you have any worsening or significant symptoms 2. We will plan for endoscopy to screen for complications related to cirrhosis 3. Continue to reduce and eventually stop drinking alcohol to prevent further liver complications  Abdominal pain: 1. I am glad your pain is improved 2. Continue taking your current medications including your acid blocker which I feel has helped your abdominal pain 3. Call us if you have any worsening or severe symptoms.   Overall I recommend:  1. Continue your other current medications 2. Return for follow-up in 4 months 3. Call us for any questions or concerns   ---------------------------------------------------------------  I am glad you have gotten your COVID-19 vaccination!  Even though you are fully vaccinated you should continue to follow CDC and state/local guidelines.  ---------------------------------------------------------------   At [facility] we value your feedback. You may receive a survey about your visit today. Please share your experience as we strive to create trusting relationships with our patients to provide genuine, compassionate, quality care.  We appreciate your understanding and patience as we review any laboratory studies, imaging, and other diagnostic tests that are ordered as we care for you. Our office policy is 5 business days for review of these results, and any emergent or urgent results are addressed in a timely manner for your best interest. If you do not hear from our office in 1 week, please contact us.   We also encourage the use of MyChart, which contains your medical information for your review as well. If you are not enrolled in this feature, an access code is on this after visit summary for your convenience. Thank you for allowing [place] to be involved in your care.  It was great to see you today!  I hope you have a great rest of your  summer!!

## 2020-06-12 ENCOUNTER — Encounter: Payer: Self-pay | Admitting: *Deleted

## 2020-06-12 ENCOUNTER — Telehealth: Payer: Self-pay | Admitting: *Deleted

## 2020-06-12 NOTE — Telephone Encounter (Signed)
Called pt and he has been scheduled for EGD with Dr. Gala Romney, propofol, ASA 3 for 08/12/2020 at 10:15am. Patient aware will mail prep instructions with pre-op/covid test appt. Confirmed mailing address.

## 2020-06-14 ENCOUNTER — Telehealth: Payer: Self-pay | Admitting: *Deleted

## 2020-06-14 NOTE — Telephone Encounter (Signed)
PA approved via [facility]. Auth# [id number] dates 08/12/2020-11/10/2020

## 2020-07-29 ENCOUNTER — Ambulatory Visit (HOSPITAL_COMMUNITY): Payer: Medicaid Other | Attending: Neurology | Admitting: Physical Therapy

## 2020-07-29 ENCOUNTER — Other Ambulatory Visit: Payer: Self-pay

## 2020-07-29 ENCOUNTER — Encounter (HOSPITAL_COMMUNITY): Payer: Self-pay | Admitting: Physical Therapy

## 2020-07-29 DIAGNOSIS — R2689 Other abnormalities of gait and mobility: Secondary | ICD-10-CM | POA: Diagnosis present

## 2020-07-29 DIAGNOSIS — M545 Low back pain, unspecified: Secondary | ICD-10-CM | POA: Diagnosis present

## 2020-07-29 DIAGNOSIS — M6281 Muscle weakness (generalized): Secondary | ICD-10-CM | POA: Insufficient documentation

## 2020-07-29 NOTE — Therapy (Signed)
[place], [place], [address] Phone: [phone]   Fax:  [phone]  Physical Therapy Evaluation  Patient Details  Name: Joshua Maldonado MRN: [id number] Date of Birth: 01-31-1956 Referring Provider (PT): Barton Fanny NP   Encounter Date: 07/29/2020   PT End of Session - 07/29/20 1024    Visit Number 1    Number of Visits 12    Date for PT Re-Evaluation 09/13/20    Authorization Type Medicaid UHC    Authorization - Visit Number 1    Authorization - Number of Visits 27    PT Start Time [phone]    PT Stop Time 1030    PT Time Calculation (min) 36 min    Activity Tolerance Patient limited by fatigue    Behavior During Therapy [facility] for tasks assessed/performed           Past Medical History:  Diagnosis Date  . Cirrhosis ([place])    Per patient, diagnosed 3-4 years ago at [facility].  Reports liver biopsy at [facility].  Suspected to be secondary to hepatitis C.  . Hepatitis C    Per patient, s/p treatment with Harvoni at [facility]  . HLD (hyperlipidemia)   . Hypertension   . Sleep apnea     Past Surgical History:  Procedure Laterality Date  . BACK SURGERY    . COLONOSCOPY    . LIVER BIOPSY      There were no vitals filed for this visit.    Subjective Assessment - 07/29/20 1004    Subjective Patient presents to physical therapy with complaint of LBP. Patient says he had back surgery 3 years ago and has 10 pins in his leg. 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 Says he has had back injection, most recent one only lasts 2 days. States no meds for these issues, but that he is taking gabapentin which helps with the nerves to some degree.    Pertinent History Lumbar surgery in 2018 (Thinks a fusion, says he has 10 screws in his back)    Limitations Lifting;Standing;Walking;House hold activities    How long can you stand comfortably? 5-10 min    How long can you walk comfortably? 5-10 min    Patient  Stated Goals Improve strength    Currently in Pain? Yes    Pain Score 7     Pain Location Back    Pain Descriptors / Indicators Aching;Numbness    Pain Type Chronic pain    Pain Radiating Towards RT foot    Pain Onset More than a month ago    Pain Frequency Constant    Aggravating Factors  Not sure, has "good days and bad days"    Pain Relieving Factors elevating legs    Effect of Pain on Daily Activities Limits              [facility] PT Assessment - 07/29/20 0001      Assessment   Medical Diagnosis LBP     Referring Provider (PT) Barton Fanny NP    Onset Date/Surgical Date --   2010   Prior Therapy Yes, in [place]       Precautions   Precautions None      Restrictions   Weight Bearing Restrictions No      Balance Screen   Has the patient fallen in the past 6 months No    Has the patient had a decrease in activity level because of  a fear of falling?  No    Is the patient reluctant to leave their home because of a fear of falling?  No      Home Ecologist residence    Living Arrangements Spouse/significant other      Prior Function   Level of Independence Independent with basic ADLs    Vocation Retired      Associate Professor   Overall Cognitive Status Within Functional Limits for tasks assessed      Observation/Other Assessments   Observations Noted RT foot drop in swing phase during gait       Transfers   Five time sit to stand comments  23.5 sec with UEs       Ambulation/Gait   Ambulation/Gait Yes    Ambulation/Gait Assistance 6: Modified independent (Device/Increase time)    Ambulation Distance (Feet) 260 Feet    Assistive device Straight cane    Gait Pattern Decreased stride length;Decreased dorsiflexion - right;Decreased step length - left    Ambulation Surface Level;Indoor    Gait Comments 2MWT                       Objective measurements completed on examination: See above findings.               PT  Education - 07/29/20 1010    Education Details on evaluation findings, POC and HEP    Person(s) Educated Patient    Methods Explanation;Handout    Comprehension Verbalized understanding            PT Short Term Goals - 07/29/20 1027      PT SHORT TERM GOAL #1   Title Patient will be independent with initial HEP and self-management strategies to improve functional outcomes    Time 3    Period Weeks    Status New    Target Date 08/23/20      PT SHORT TERM GOAL #2   Title Patient will be able to perform stand x 5 in < 20 seconds to demonstrate improvement in functional mobility and reduced risk for falls.    Time 3    Period Weeks    Status New    Target Date 08/23/20             PT Long Term Goals - 07/29/20 1028      PT LONG TERM GOAL #1   Title Patient will be able to perform stand x 5 in < 15 seconds to demonstrate improvement in functional mobility and reduced risk for falls.    Time 6    Period Weeks    Status New    Target Date 09/13/20      PT LONG TERM GOAL #2   Title Patient will be able to ambulate at least 325 feet during 2MWT with LRAD to demonstrate improved ability to perform functional mobility and associated tasks.    Time 6    Period Weeks    Status New    Target Date 09/13/20      PT LONG TERM GOAL #3   Title Patient will report at least 50% overall improvement in subjective complaint to indicate improvement in ability to perform ADLs.    Time 6    Period Weeks    Status New    Target Date 09/13/20      PT LONG TERM GOAL #4   Title Patient will be able to stand > 30 minutes with pain  not to exceed 4/10 in lumbar to improve ability to perform cooking and grooming ADLs.    Time 6    Period Weeks    Status New    Target Date 09/13/20                  Plan - 07/29/20 1025    Clinical Impression Statement Patient is a 64 y.o. male who presents to physical therapy with complaint of LBP. Patient demonstrates decreased functional  strength, decreased perceived functional ability and gait abnormalities which are likely contributing to symptoms of pain and are negatively impacting patient ability to perform ADLs and functional mobility tasks. Patient will benefit from skilled physical therapy services to address these deficits to reduce pain, improve level of function with ADLs, functional mobility tasks, and reduce risk for falls.    Personal Factors and Comorbidities Comorbidity 1    Comorbidities HTN    Examination-Activity Limitations Stairs;Squat;Transfers;Locomotion Level;Stand    Stability/Clinical Decision Making Stable/Uncomplicated    Clinical Decision Making Low    Rehab Potential Fair    PT Frequency 2x / week    PT Duration 6 weeks    PT Treatment/Interventions Aquatic Therapy;Biofeedback;Cryotherapy;Fluidtherapy;Patient/family education;Manual techniques;Functional mobility training;Therapeutic activities;Manual lymph drainage;Energy conservation;Splinting;Spinal Manipulations;Dry needling;Scar mobilization;Passive range of motion;Orthotic Fit/Training;Vasopneumatic Device;Taping;Joint Manipulations;Neuromuscular re-education;Stair training;DME Instruction;Gait training;Balance training;Therapeutic exercise;Contrast Bath;Electrical Stimulation;Iontophoresis 4mg /ml Dexamethasone;ADLs/Self Care Home Management;Moist Heat;Traction;Ultrasound;Parrafin;Compression bandaging    PT Next Visit Plan Review goals and HEP. Progress core and LE strengh as tolerated. Progress to gait and balance when ready.    PT Home Exercise Plan 07/29/20: sit to stand    Consulted and Agree with Plan of Care Patient           Patient will benefit from skilled therapeutic intervention in order to improve the following deficits and impairments:  Pain, Improper body mechanics, Difficulty walking, Decreased strength, Decreased activity tolerance, Decreased endurance, Impaired perceived functional ability, Abnormal gait  Visit Diagnosis: Low  back pain, unspecified back pain laterality, unspecified chronicity, unspecified whether sciatica present  Muscle weakness (generalized)  Other abnormalities of gait and mobility     Problem List Patient Active Problem List   Diagnosis Date Noted  . History of hepatitis C 01/31/2020  . Cirrhosis of liver without ascites ([place]) 01/31/2020  . Dilation of biliary tract 01/31/2020  . Pain of upper abdomen 01/31/2020  . Constipation 01/31/2020    10:37 AM, 07/29/20 Josue Hector PT DPT  Physical Therapist with [facility]  [phone] [place] [address] [place], [place], [address] Phone: [phone]   Fax:  [phone]  Name: Joshua Maldonado MRN: [id number] Date of Birth: [birth year]

## 2020-08-06 ENCOUNTER — Ambulatory Visit (HOSPITAL_COMMUNITY): Payer: Medicaid Other | Attending: Neurology | Admitting: Physical Therapy

## 2020-08-06 ENCOUNTER — Other Ambulatory Visit: Payer: Self-pay

## 2020-08-06 ENCOUNTER — Encounter (HOSPITAL_COMMUNITY): Payer: Self-pay | Admitting: Physical Therapy

## 2020-08-06 DIAGNOSIS — M545 Low back pain, unspecified: Secondary | ICD-10-CM | POA: Insufficient documentation

## 2020-08-06 DIAGNOSIS — M6281 Muscle weakness (generalized): Secondary | ICD-10-CM | POA: Diagnosis present

## 2020-08-06 DIAGNOSIS — R2689 Other abnormalities of gait and mobility: Secondary | ICD-10-CM | POA: Insufficient documentation

## 2020-08-06 NOTE — Therapy (Signed)
[place], [place], [address] Phone: [phone]   Fax:  [phone]  Physical Therapy Treatment  Patient Details  Name: Joshua Maldonado MRN: [id number] Date of Birth: [birth year] Referring Provider (PT): Barton Fanny NP   Encounter Date: 08/06/2020   PT End of Session - 08/06/20 1345    Visit Number 2    Number of Visits 12    Date for PT Re-Evaluation 09/13/20    Authorization Type Medicaid UHC    Authorization - Visit Number 2    Authorization - Number of Visits 27    PT Start Time 9024    PT Stop Time 1355    PT Time Calculation (min) 40 min    Activity Tolerance Patient limited by fatigue    Behavior During Therapy [facility] for tasks assessed/performed           Past Medical History:  Diagnosis Date  . Cirrhosis ([place])    Per patient, diagnosed 3-4 years ago at [facility].  Reports liver biopsy at [facility].  Suspected to be secondary to hepatitis C.  . Hepatitis C    Per patient, s/p treatment with Harvoni at [facility]  . HLD (hyperlipidemia)   . Hypertension   . Sleep apnea     Past Surgical History:  Procedure Laterality Date  . BACK SURGERY    . COLONOSCOPY    . LIVER BIOPSY      There were no vitals filed for this visit.   Subjective Assessment - 08/06/20 1318    Subjective PT has just come from walking around in [employer] so his pain is up a bit.    Pertinent History Lumbar surgery in 2018 (Thinks a fusion, says he has 10 screws in his back)    Limitations Lifting;Standing;Walking;House hold activities    How long can you stand comfortably? 5-10 min    How long can you walk comfortably? 5-10 min    Patient Stated Goals Improve strength    Currently in Pain? Yes    Pain Score 8     Pain Location Back    Pain Orientation Lower    Pain Descriptors / Indicators Pressure    Pain Type Chronic pain    Pain Onset More than a month ago    Pain Frequency Constant    Aggravating Factors   standing    Pain Relieving Factors moving    Effect of Pain on Daily Activities limits                             [facility] Adult PT Treatment/Exercise - 08/06/20 0001      Bed Mobility   Bed Mobility --   body mechanics for proper      Exercises   Exercises Lumbar      Lumbar Exercises: Stretches   Single Knee to Chest Stretch 3 reps      Lumbar Exercises: Standing   Other Standing Lumbar Exercises wall arch       Lumbar Exercises: Seated   Sit to Stand 10 reps    Other Seated Lumbar Exercises pulling into good posture and holding x 3 deep breaths x  10       Lumbar Exercises: Supine   Ab Set 10 reps    Glut Set 10 reps    Bent Knee Raise 5 reps    Bridge 10 reps  PT Short Term Goals - 08/06/20 1328      PT SHORT TERM GOAL #1   Title Patient will be independent with initial HEP and self-management strategies to improve functional outcomes    Time 3    Period Weeks    Status On-going    Target Date 08/23/20      PT SHORT TERM GOAL #2   Title Patient will be able to perform stand x 5 in < 20 seconds to demonstrate improvement in functional mobility and reduced risk for falls.    Time 3    Period Weeks    Status On-going    Target Date 08/23/20             PT Long Term Goals - 08/06/20 1329      PT LONG TERM GOAL #1   Title Patient will be able to perform stand x 5 in < 15 seconds to demonstrate improvement in functional mobility and reduced risk for falls.    Time 6    Period Weeks    Status On-going      PT LONG TERM GOAL #2   Title Patient will be able to ambulate at least 325 feet during 2MWT with LRAD to demonstrate improved ability to perform functional mobility and associated tasks.    Time 6    Period Weeks    Status On-going      PT LONG TERM GOAL #3   Title Patient will report at least 50% overall improvement in subjective complaint to indicate improvement in ability to perform ADLs.    Time 6     Period Weeks    Status On-going      PT LONG TERM GOAL #4   Title Patient will be able to stand > 30 minutes with pain not to exceed 4/10 in lumbar to improve ability to perform cooking and grooming ADLs.    Time 6    Period Weeks    Status On-going                 Plan - 08/06/20 1317    Clinical Impression Statement Reviewed evaluation and goals with pt.  Pt HEP initiated as pt states he was not given one at eval.  Pt tends to hold his breath with all exercises.  Therapist reviewed good body mechanics for sit to stand and sit to sideylying.    Personal Factors and Comorbidities Comorbidity 1    Comorbidities HTN    Examination-Activity Limitations Stairs;Squat;Transfers;Locomotion Level;Stand    Stability/Clinical Decision Making Stable/Uncomplicated    Rehab Potential Fair    PT Frequency 2x / week    PT Duration 6 weeks    PT Treatment/Interventions Aquatic Therapy;Biofeedback;Cryotherapy;Fluidtherapy;Patient/family education;Manual techniques;Functional mobility training;Therapeutic activities;Manual lymph drainage;Energy conservation;Splinting;Spinal Manipulations;Dry needling;Scar mobilization;Passive range of motion;Orthotic Fit/Training;Vasopneumatic Device;Taping;Joint Manipulations;Neuromuscular re-education;Stair training;DME Instruction;Gait training;Balance training;Therapeutic exercise;Contrast Bath;Electrical Stimulation;Iontophoresis 4mg /ml Dexamethasone;ADLs/Self Care Home Management;Moist Heat;Traction;Ultrasound;Parrafin;Compression bandaging    PT Next Visit Plan Progress core and LE strengh as tolerated. Progress to gait and balance when ready.    PT Home Exercise Plan 07/29/20: sit to stand; 11/2: sit to stand sitting posture, ab set, bridge, knee to chest and hamstring stretch    Consulted and Agree with Plan of Care Patient           Patient will benefit from skilled therapeutic intervention in order to improve the following deficits and impairments:   Pain, Improper body mechanics, Difficulty walking, Decreased strength, Decreased activity tolerance, Decreased endurance, Impaired perceived functional ability,  Abnormal gait  Visit Diagnosis: Low back pain, unspecified back pain laterality, unspecified chronicity, unspecified whether sciatica present  Muscle weakness (generalized)  Other abnormalities of gait and mobility     Problem List Patient Active Problem List   Diagnosis Date Noted  . History of hepatitis C 01/31/2020  . Cirrhosis of liver without ascites ([place]) 01/31/2020  . Dilation of biliary tract 01/31/2020  . Pain of upper abdomen 01/31/2020  . Constipation 01/31/2020   Rayetta Humphrey, PT CLT [phone] 08/06/2020, 2:01 PM  [place] [address] [place], [place], [address] Phone: [phone]   Fax:  [phone]  Name: Hassaan Crite MRN: [id number] Date of Birth: 01-19-1956

## 2020-08-07 ENCOUNTER — Other Ambulatory Visit: Payer: Self-pay | Admitting: Gastroenterology

## 2020-08-07 DIAGNOSIS — R1010 Upper abdominal pain, unspecified: Secondary | ICD-10-CM

## 2020-08-07 NOTE — Patient Instructions (Signed)
Joshua Maldonado  08/07/2020     @PREFPERIOPPHARMACY @   Your procedure is scheduled on  08/12/2020.  Report to [facility] at  [facility]  Call this number if you have problems the morning of surgery:  [phone]   Remember:  Follow the diet instructions given to you by the office.                       Take these medicines the morning of surgery with A SIP OF WATER  Allopurinol, flexeril(if needed), cymbalta, gabapentin, protonix.    Do not wear jewelry, make-up or nail polish.  Do not wear lotions, powders, or perfumes. Please wear deodorant and brush your teeth.  Do not shave 48 hours prior to surgery.  Men may shave face and neck.  Do not bring valuables to the hospital.  [facility] is not responsible for any belongings or valuables.  Contacts, dentures or bridgework may not be worn into surgery.  Leave your suitcase in the car.  After surgery it may be brought to your room.  For patients admitted to the hospital, discharge time will be determined by your treatment team.  Patients discharged the day of surgery will not be allowed to drive home.   Name and phone number of your driver:   family Special instructions:  DO NOT smoke the morning of your procedure.  Please read over the following fact sheets that you were given. Anesthesia Post-op Instructions and Care and Recovery After Surgery       Upper Endoscopy, Adult, Care After This sheet gives you information about how to care for yourself after your procedure. Your health care provider may also give you more specific instructions. If you have problems or questions, contact your health care provider. What can I expect after the procedure? After the procedure, it is common to have:  A sore throat.  Mild stomach pain or discomfort.  Bloating.  Nausea. Follow these instructions at home:   Follow instructions from your health care provider about what to eat or drink after your procedure.  Return to  your normal activities as told by your health care provider. Ask your health care provider what activities are safe for you.  Take over-the-counter and prescription medicines only as told by your health care provider.  Do not drive for 24 hours if you were given a sedative during your procedure.  Keep all follow-up visits as told by your health care provider. This is important. Contact a health care provider if you have:  A sore throat that lasts longer than one day.  Trouble swallowing. Get help right away if:  You vomit blood or your vomit looks like coffee grounds.  You have: ? A fever. ? Bloody, black, or tarry stools. ? A severe sore throat or you cannot swallow. ? Difficulty breathing. ? Severe pain in your chest or abdomen. Summary  After the procedure, it is common to have a sore throat, mild stomach discomfort, bloating, and nausea.  Do not drive for 24 hours if you were given a sedative during the procedure.  Follow instructions from your health care provider about what to eat or drink after your procedure.  Return to your normal activities as told by your health care provider. This information is not intended to replace advice given to you by your health care provider. Make sure you discuss any questions you have with your health care provider. Document Revised:  03/15/2018 Document Reviewed: 02/21/2018 Elsevier Patient Education  [place] After These instructions provide you with information about caring for yourself after your procedure. Your health care provider may also give you more specific instructions. Your treatment has been planned according to current medical practices, but problems sometimes occur. Call your health care provider if you have any problems or questions after your procedure. What can I expect after the procedure? After your procedure, you may:  Feel sleepy for several hours.  Feel clumsy and have  poor balance for several hours.  Feel forgetful about what happened after the procedure.  Have poor judgment for several hours.  Feel nauseous or vomit.  Have a sore throat if you had a breathing tube during the procedure. Follow these instructions at home: For at least 24 hours after the procedure:      Have a responsible adult stay with you. It is important to have someone help care for you until you are awake and alert.  Rest as needed.  Do not: ? Participate in activities in which you could fall or become injured. ? Drive. ? Use heavy machinery. ? Drink alcohol. ? Take sleeping pills or medicines that cause drowsiness. ? Make important decisions or sign legal documents. ? Take care of children on your own. Eating and drinking  Follow the diet that is recommended by your health care provider.  If you vomit, drink water, juice, or soup when you can drink without vomiting.  Make sure you have little or no nausea before eating solid foods. General instructions  Take over-the-counter and prescription medicines only as told by your health care provider.  If you have sleep apnea, surgery and certain medicines can increase your risk for breathing problems. Follow instructions from your health care provider about wearing your sleep device: ? Anytime you are sleeping, including during daytime naps. ? While taking prescription pain medicines, sleeping medicines, or medicines that make you drowsy.  If you smoke, do not smoke without supervision.  Keep all follow-up visits as told by your health care provider. This is important. Contact a health care provider if:  You keep feeling nauseous or you keep vomiting.  You feel light-headed.  You develop a rash.  You have a fever. Get help right away if:  You have trouble breathing. Summary  For several hours after your procedure, you may feel sleepy and have poor judgment.  Have a responsible adult stay with you for at  least 24 hours or until you are awake and alert. This information is not intended to replace advice given to you by your health care provider. Make sure you discuss any questions you have with your health care provider. Document Revised: 12/20/2017 Document Reviewed: 01/12/2016 Elsevier Patient Education  [place].

## 2020-08-08 ENCOUNTER — Other Ambulatory Visit (HOSPITAL_COMMUNITY)
Admission: RE | Admit: 2020-08-08 | Discharge: 2020-08-08 | Disposition: A | Discharge status: Home / Self Care | Payer: Medicaid Other | Source: Ambulatory Visit | Attending: Internal Medicine | Admitting: Internal Medicine

## 2020-08-08 ENCOUNTER — Encounter (HOSPITAL_COMMUNITY)
Admission: RE | Admit: 2020-08-08 | Discharge: 2020-08-08 | Disposition: A | Discharge status: Home / Self Care | Payer: Medicaid Other | Source: Ambulatory Visit | Attending: Internal Medicine | Admitting: Internal Medicine

## 2020-08-08 ENCOUNTER — Ambulatory Visit (HOSPITAL_COMMUNITY): Payer: Medicaid Other | Admitting: Physical Therapy

## 2020-08-08 ENCOUNTER — Other Ambulatory Visit: Payer: Self-pay

## 2020-08-08 ENCOUNTER — Encounter (HOSPITAL_COMMUNITY): Payer: Self-pay

## 2020-08-08 DIAGNOSIS — Z20822 Contact with and (suspected) exposure to covid-19: Secondary | ICD-10-CM | POA: Insufficient documentation

## 2020-08-08 DIAGNOSIS — Z01818 Encounter for other preprocedural examination: Secondary | ICD-10-CM | POA: Insufficient documentation

## 2020-08-08 DIAGNOSIS — R2689 Other abnormalities of gait and mobility: Secondary | ICD-10-CM

## 2020-08-08 DIAGNOSIS — M545 Low back pain, unspecified: Secondary | ICD-10-CM

## 2020-08-08 DIAGNOSIS — M6281 Muscle weakness (generalized): Secondary | ICD-10-CM

## 2020-08-08 HISTORY — DX: Gout, unspecified: M10.9

## 2020-08-08 LAB — CBC WITH DIFFERENTIAL/PLATELET
Abs Immature Granulocytes: 0.01 10*3/uL (ref 0.00–0.07)
Basophils Absolute: 0 10*3/uL (ref 0.0–0.1)
Basophils Relative: 1 %
Eosinophils Absolute: 0.2 10*3/uL (ref 0.0–0.5)
Eosinophils Relative: 3 %
HCT: 44.6 % (ref 39.0–52.0)
Hemoglobin: 14.9 g/dL (ref 13.0–17.0)
Immature Granulocytes: 0 %
Lymphocytes Relative: 26 %
Lymphs Abs: 1.6 10*3/uL (ref 0.7–4.0)
MCH: 30 pg (ref 26.0–34.0)
MCHC: 33.4 g/dL (ref 30.0–36.0)
MCV: 89.7 fL (ref 80.0–100.0)
Monocytes Absolute: 0.6 10*3/uL (ref 0.1–1.0)
Monocytes Relative: 10 %
Neutro Abs: 3.8 10*3/uL (ref 1.7–7.7)
Neutrophils Relative %: 60 %
Platelets: 132 10*3/uL — ABNORMAL LOW (ref 150–400)
RBC: 4.97 MIL/uL (ref 4.22–5.81)
RDW: 12.8 % (ref 11.5–15.5)
WBC: 6.3 10*3/uL (ref 4.0–10.5)
nRBC: 0 % (ref 0.0–0.2)

## 2020-08-08 LAB — SARS CORONAVIRUS 2 (TAT 6-24 HRS): SARS Coronavirus 2: NEGATIVE

## 2020-08-08 LAB — COMPREHENSIVE METABOLIC PANEL
ALT: 32 U/L (ref 0–44)
AST: 29 U/L (ref 15–41)
Albumin: 3.9 g/dL (ref 3.5–5.0)
Alkaline Phosphatase: 54 U/L (ref 38–126)
Anion gap: 10 (ref 5–15)
BUN: 21 mg/dL (ref 8–23)
CO2: 21 mmol/L — ABNORMAL LOW (ref 22–32)
Calcium: 9.1 mg/dL (ref 8.9–10.3)
Chloride: 107 mmol/L (ref 98–111)
Creatinine, Ser: 1.88 mg/dL — ABNORMAL HIGH (ref 0.61–1.24)
GFR, Estimated: 40 mL/min — ABNORMAL LOW (ref >60)
Glucose, Bld: 130 mg/dL — ABNORMAL HIGH (ref 70–99)
Potassium: 3.8 mmol/L (ref 3.5–5.1)
Sodium: 138 mmol/L (ref 135–145)
Total Bilirubin: 0.7 mg/dL (ref 0.3–1.2)
Total Protein: 8.1 g/dL (ref 6.5–8.1)

## 2020-08-08 LAB — PROTIME-INR
INR: 1 (ref 0.8–1.2)
Prothrombin Time: 13 seconds (ref 11.4–15.2)

## 2020-08-08 NOTE — Therapy (Signed)
[place], [place], [address] Phone: [phone]   Fax:  [phone]  Physical Therapy Treatment  Patient Details  Name: Joshua Maldonado MRN: [id number] Date of Birth: 1956-03-11 Referring Provider (PT): Barton Fanny NP   Encounter Date: 08/08/2020   PT End of Session - 08/08/20 1047    Visit Number 3    Number of Visits 12    Date for PT Re-Evaluation 09/13/20    Authorization Type Medicaid UHC    Authorization - Visit Number 3    Authorization - Number of Visits 27    PT Start Time [phone]    PT Stop Time 1002    PT Time Calculation (min) 38 min    Activity Tolerance Patient limited by fatigue    Behavior During Therapy [facility] for tasks assessed/performed           Past Medical History:  Diagnosis Date  . Cirrhosis ([place])    Per patient, diagnosed 3-4 years ago at [facility].  Reports liver biopsy at [facility].  Suspected to be secondary to hepatitis C.  . Hepatitis C    Per patient, s/p treatment with Harvoni at [facility]  . HLD (hyperlipidemia)   . Hypertension   . Sleep apnea     Past Surgical History:  Procedure Laterality Date  . BACK SURGERY    . COLONOSCOPY    . LIVER BIOPSY      There were no vitals filed for this visit.   Subjective Assessment - 08/08/20 0935    Subjective Pt states he has less pain today at 5/10.  STates it usually stays around 7/10. Pt comes today 7 minutes late; wearing a lumbar support.    Currently in Pain? Yes    Pain Score 5     Pain Location Back    Pain Orientation Lower    Pain Descriptors / Indicators Pressure                             [facility] Adult PT Treatment/Exercise - 08/08/20 0001      Lumbar Exercises: Stretches   Single Knee to Chest Stretch 3 reps    Other Lumbar Stretch Exercise corner chest stretch 3X20      Lumbar Exercises: Standing   Other Standing Lumbar Exercises wall arch  10X    Other Standing Lumbar  Exercises UE flexion against wall 10X, against wall W-back retraction 10X      Lumbar Exercises: Seated   Sit to Stand 10 reps    Sit to Stand Limitations no UE's standard height      Lumbar Exercises: Supine   Ab Set 15 reps    Bridge 15 reps    Straight Leg Raise 10 reps;Limitations    Straight Leg Raises Limitations with proper breathing, correct posturing.                    PT Short Term Goals - 08/06/20 1328      PT SHORT TERM GOAL #1   Title Patient will be independent with initial HEP and self-management strategies to improve functional outcomes    Time 3    Period Weeks    Status On-going    Target Date 08/23/20      PT SHORT TERM GOAL #2   Title Patient will be able to perform stand x 5 in < 20 seconds to demonstrate improvement in  functional mobility and reduced risk for falls.    Time 3    Period Weeks    Status On-going    Target Date 08/23/20             PT Long Term Goals - 08/06/20 1329      PT LONG TERM GOAL #1   Title Patient will be able to perform stand x 5 in < 15 seconds to demonstrate improvement in functional mobility and reduced risk for falls.    Time 6    Period Weeks    Status On-going      PT LONG TERM GOAL #2   Title Patient will be able to ambulate at least 325 feet during 2MWT with LRAD to demonstrate improved ability to perform functional mobility and associated tasks.    Time 6    Period Weeks    Status On-going      PT LONG TERM GOAL #3   Title Patient will report at least 50% overall improvement in subjective complaint to indicate improvement in ability to perform ADLs.    Time 6    Period Weeks    Status On-going      PT LONG TERM GOAL #4   Title Patient will be able to stand > 30 minutes with pain not to exceed 4/10 in lumbar to improve ability to perform cooking and grooming ADLs.    Time 6    Period Weeks    Status On-going                 Plan - 08/08/20 1048    Clinical Impression Statement  Focus remained today on improving postural stability including spinal strengthening and anterior stretches to open up chest, promote scapular retraction.  Proper breathing cues required throughout treatment as patient is easily short winded and holds breath with exertion.  Added standing corner stretch and several other standing postural exercises this session.    Personal Factors and Comorbidities Comorbidity 1    Comorbidities HTN    Examination-Activity Limitations Stairs;Squat;Transfers;Locomotion Level;Stand    Stability/Clinical Decision Making Stable/Uncomplicated    Rehab Potential Fair    PT Frequency 2x / week    PT Duration 6 weeks    PT Treatment/Interventions Aquatic Therapy;Biofeedback;Cryotherapy;Fluidtherapy;Patient/family education;Manual techniques;Functional mobility training;Therapeutic activities;Manual lymph drainage;Energy conservation;Splinting;Spinal Manipulations;Dry needling;Scar mobilization;Passive range of motion;Orthotic Fit/Training;Vasopneumatic Device;Taping;Joint Manipulations;Neuromuscular re-education;Stair training;DME Instruction;Gait training;Balance training;Therapeutic exercise;Contrast Bath;Electrical Stimulation;Iontophoresis 4mg /ml Dexamethasone;ADLs/Self Care Home Management;Moist Heat;Traction;Ultrasound;Parrafin;Compression bandaging    PT Next Visit Plan Progress core and LE strengh as tolerated. Progress to gait and balance when ready.    PT Home Exercise Plan 07/29/20: sit to stand; 11/2: sit to stand sitting posture, ab set, bridge, knee to chest and hamstring stretch    Consulted and Agree with Plan of Care Patient           Patient will benefit from skilled therapeutic intervention in order to improve the following deficits and impairments:  Pain, Improper body mechanics, Difficulty walking, Decreased strength, Decreased activity tolerance, Decreased endurance, Impaired perceived functional ability, Abnormal gait  Visit Diagnosis: Other  abnormalities of gait and mobility  Low back pain, unspecified back pain laterality, unspecified chronicity, unspecified whether sciatica present  Muscle weakness (generalized)     Problem List Patient Active Problem List   Diagnosis Date Noted  . History of hepatitis C 01/31/2020  . Cirrhosis of liver without ascites ([place]) 01/31/2020  . Dilation of biliary tract 01/31/2020  . Pain of upper abdomen 01/31/2020  . Constipation 01/31/2020  Teena Irani, PTA/CLT [phone]  Teena Irani 08/08/2020, 10:49 AM  [place], [place], [address] Phone: [phone]   Fax:  [phone]  Name: Joshua Maldonado MRN: [id number] Date of Birth: [birth year]

## 2020-08-11 ENCOUNTER — Ambulatory Visit (HOSPITAL_COMMUNITY): Payer: Medicaid Other | Admitting: Anesthesiology

## 2020-08-12 ENCOUNTER — Ambulatory Visit (HOSPITAL_COMMUNITY)
Admission: RE | Admit: 2020-08-12 | Discharge: 2020-08-12 | Disposition: A | Discharge status: Home / Self Care | Payer: Medicaid Other | Attending: Internal Medicine | Admitting: Internal Medicine

## 2020-08-12 ENCOUNTER — Encounter (HOSPITAL_COMMUNITY): Payer: Self-pay | Admitting: Internal Medicine

## 2020-08-12 ENCOUNTER — Telehealth: Payer: Self-pay | Admitting: *Deleted

## 2020-08-12 ENCOUNTER — Encounter (HOSPITAL_COMMUNITY)
Admission: RE | Disposition: A | Discharge status: Home / Self Care | Payer: Self-pay | Source: Home / Self Care | Attending: Internal Medicine

## 2020-08-12 DIAGNOSIS — K7460 Unspecified cirrhosis of liver: Secondary | ICD-10-CM | POA: Diagnosis not present

## 2020-08-12 DIAGNOSIS — R109 Unspecified abdominal pain: Secondary | ICD-10-CM | POA: Insufficient documentation

## 2020-08-12 DIAGNOSIS — Z5309 Procedure and treatment not carried out because of other contraindication: Secondary | ICD-10-CM | POA: Insufficient documentation

## 2020-08-12 SURGERY — ESOPHAGOGASTRODUODENOSCOPY (EGD) WITH PROPOFOL
Anesthesia: Monitor Anesthesia Care

## 2020-08-12 MED ORDER — CHLORHEXIDINE GLUCONATE CLOTH 2 % EX PADS: 6.0000 | MEDICATED_PAD | Freq: Once | CUTANEOUS | Status: DC

## 2020-08-12 MED ORDER — LACTATED RINGERS IV SOLN: Freq: Once | INTRAVENOUS | Status: DC

## 2020-08-12 MED ORDER — LIDOCAINE VISCOUS HCL 2 % MT SOLN: 15.0000 mL | Freq: Once | OROMUCOSAL | Status: DC

## 2020-08-12 MED ORDER — GLYCOPYRROLATE 0.2 MG/ML IJ SOLN
INTRAMUSCULAR | Status: AC
  Filled 2020-08-12: qty 1

## 2020-08-12 MED ORDER — LIDOCAINE VISCOUS HCL 2 % MT SOLN
OROMUCOSAL | Status: AC
  Filled 2020-08-12: qty 15

## 2020-08-12 MED ORDER — GLYCOPYRROLATE 0.2 MG/ML IJ SOLN: 0.2000 mg | Freq: Once | INTRAMUSCULAR | Status: DC

## 2020-08-12 NOTE — Progress Notes (Signed)
Patient stopped taking blood pressure medication 2 weeks ago, diastolic blood pressures are 129 to 145. Discussed with Dr. Sydell Axon. Procedure cancelled and will be rescheduled after optimizing his blood pressure.

## 2020-08-12 NOTE — Progress Notes (Signed)
Dr. Charna Elizabeth informed about high BP & spoke with Dr. Gala Romney regarding BP readings. Pt. Stated has been off BP medicine for almost 2 weeks.  Dr. Charna Elizabeth & Dr. Gala Romney cancelled procedure d/t unsafe BP. I made appt with his primary at [facility] for tomorrow 08/13/20 @ 11:15am with Weyman Croon. Dr. Coralee North office will be in touch as to when to reschedule.

## 2020-08-12 NOTE — Telephone Encounter (Signed)
-----   Message from Daneil Dolin, MD sent at 08/12/2020  9:45 AM EST ----- EGD today canceled because this patient's diastolic blood pressure is 149 mm; ran out of BP meds 2 weeks ago.  told to follow-up with PCP  Needs an office visit with [place] face-to-face to get him straight prior to rescheduling.  Thanks.

## 2020-08-12 NOTE — Telephone Encounter (Signed)
OV 12/28 at 0900 with Walden Field, NP

## 2020-08-12 NOTE — Telephone Encounter (Signed)
Joshua Maldonado, please schedule and mail letter thanks

## 2020-08-13 ENCOUNTER — Ambulatory Visit (HOSPITAL_COMMUNITY): Payer: Medicaid Other | Admitting: Physical Therapy

## 2020-08-15 ENCOUNTER — Ambulatory Visit (HOSPITAL_COMMUNITY): Payer: Medicaid Other | Admitting: Physical Therapy

## 2020-08-20 ENCOUNTER — Ambulatory Visit (HOSPITAL_COMMUNITY): Payer: Medicaid Other | Admitting: Physical Therapy

## 2020-08-20 ENCOUNTER — Other Ambulatory Visit: Payer: Self-pay

## 2020-08-20 DIAGNOSIS — M6281 Muscle weakness (generalized): Secondary | ICD-10-CM

## 2020-08-20 DIAGNOSIS — M545 Low back pain, unspecified: Secondary | ICD-10-CM | POA: Diagnosis not present

## 2020-08-20 DIAGNOSIS — R2689 Other abnormalities of gait and mobility: Secondary | ICD-10-CM

## 2020-08-20 NOTE — Therapy (Signed)
[place], [place], [address] Phone: [phone]   Fax:  [phone]  Physical Therapy Treatment  Patient Details  Name: Joshua Maldonado MRN: [id number] Date of Birth: 1956-07-08 Referring Provider (PT): Barton Fanny NP   Encounter Date: 08/20/2020   PT End of Session - 08/20/20 0958    Visit Number 4    Number of Visits 12    Date for PT Re-Evaluation 09/13/20    Authorization Type Medicaid UHC    Authorization - Visit Number 4    Authorization - Number of Visits 27    PT Start Time 0920    PT Stop Time 1000    PT Time Calculation (min) 40 min    Activity Tolerance Patient limited by fatigue    Behavior During Therapy [facility] for tasks assessed/performed           Past Medical History:  Diagnosis Date   Cirrhosis ([place])    Per patient, diagnosed 3-4 years ago at [facility].  Reports liver biopsy at [facility].  Suspected to be secondary to hepatitis C.   Gout    Hepatitis C    Per patient, s/p treatment with Harvoni at [place] (hyperlipidemia)    Hypertension    Sleep apnea     Past Surgical History:  Procedure Laterality Date   BACK SURGERY     COLONOSCOPY     LIVER BIOPSY      There were no vitals filed for this visit.   Subjective Assessment - 08/20/20 0919    Subjective Pt states he has fallen out of the bed twice since his last visit.  STates one was because of a dream and he was "swinging at people" the last one he has no clue what happened, just that he woke up in the floor. States his pain today is 9/10 and is mostly in his lower back and "CI joint" pointing to Rt hip.    Currently in Pain? Yes    Pain Score 9     Pain Location Back    Pain Orientation Lower    Pain Descriptors / Indicators Constant;Aching                             [facility] Adult PT Treatment/Exercise - 08/20/20 0001      Lumbar Exercises: Stretches   Single Knee to Chest  Stretch 5 reps;10 seconds    Lower Trunk Rotation 5 reps    Other Lumbar Stretch Exercise corner chest stretch 3X20      Lumbar Exercises: Standing   Other Standing Lumbar Exercises wall arch  10X    Other Standing Lumbar Exercises UE flexion against wall 10X, against wall W-back retraction 10X      Lumbar Exercises: Seated   Sit to Stand 10 reps    Sit to Stand Limitations no UE's standard height    Other Seated Lumbar Exercises pulling into good posture and holding x 3 deep breaths x  10       Lumbar Exercises: Supine   Ab Set 15 reps    Bridge 15 reps    Straight Leg Raise 10 reps;Limitations    Straight Leg Raises Limitations with proper breathing, correct posturing.                  PT Education - 08/20/20 0958    Education Details logroll technique, HEP compliance  Person(s) Educated Patient    Methods Explanation    Comprehension Verbalized understanding            PT Short Term Goals - 08/06/20 1328      PT SHORT TERM GOAL #1   Title Patient will be independent with initial HEP and self-management strategies to improve functional outcomes    Time 3    Period Weeks    Status On-going    Target Date 08/23/20      PT SHORT TERM GOAL #2   Title Patient will be able to perform stand x 5 in < 20 seconds to demonstrate improvement in functional mobility and reduced risk for falls.    Time 3    Period Weeks    Status On-going    Target Date 08/23/20             PT Long Term Goals - 08/06/20 1329      PT LONG TERM GOAL #1   Title Patient will be able to perform stand x 5 in < 15 seconds to demonstrate improvement in functional mobility and reduced risk for falls.    Time 6    Period Weeks    Status On-going      PT LONG TERM GOAL #2   Title Patient will be able to ambulate at least 325 feet during 2MWT with LRAD to demonstrate improved ability to perform functional mobility and associated tasks.    Time 6    Period Weeks    Status On-going       PT LONG TERM GOAL #3   Title Patient will report at least 50% overall improvement in subjective complaint to indicate improvement in ability to perform ADLs.    Time 6    Period Weeks    Status On-going      PT LONG TERM GOAL #4   Title Patient will be able to stand > 30 minutes with pain not to exceed 4/10 in lumbar to improve ability to perform cooking and grooming ADLs.    Time 6    Period Weeks    Status On-going                 Plan - 08/20/20 1003    Clinical Impression Statement Encouraged patient to return to MD in light of 2 falls out of bed in the last week with resultant increase in pain.  No Compliance with HEP and explained importance with carryover and benefits of doing so with patient.  Continued with established therex.  Did not progress exercises today in light of increased pain levels.  Pt with cues to complete logroll for edge of bed to/from supine as flopped down with positive pain behaviors.  Pt able to complete all instructed therex without general fatigue but no complaints of pain.    Personal Factors and Comorbidities Comorbidity 1    Comorbidities HTN    Examination-Activity Limitations Stairs;Squat;Transfers;Locomotion Level;Stand    Stability/Clinical Decision Making Stable/Uncomplicated    Rehab Potential Fair    PT Frequency 2x / week    PT Duration 6 weeks    PT Treatment/Interventions Aquatic Therapy;Biofeedback;Cryotherapy;Fluidtherapy;Patient/family education;Manual techniques;Functional mobility training;Therapeutic activities;Manual lymph drainage;Energy conservation;Splinting;Spinal Manipulations;Dry needling;Scar mobilization;Passive range of motion;Orthotic Fit/Training;Vasopneumatic Device;Taping;Joint Manipulations;Neuromuscular re-education;Stair training;DME Instruction;Gait training;Balance training;Therapeutic exercise;Contrast Bath;Electrical Stimulation;Iontophoresis 4mg /ml Dexamethasone;ADLs/Self Care Home Management;Moist  Heat;Traction;Ultrasound;Parrafin;Compression bandaging    PT Next Visit Plan Progress core and LE strengh as tolerated. Progress to gait and balance when ready.    PT Home Exercise Plan  07/29/20: sit to stand; 11/2: sit to stand sitting posture, ab set, bridge, knee to chest and hamstring stretch    Consulted and Agree with Plan of Care Patient           Patient will benefit from skilled therapeutic intervention in order to improve the following deficits and impairments:  Pain, Improper body mechanics, Difficulty walking, Decreased strength, Decreased activity tolerance, Decreased endurance, Impaired perceived functional ability, Abnormal gait  Visit Diagnosis: Other abnormalities of gait and mobility  Low back pain, unspecified back pain laterality, unspecified chronicity, unspecified whether sciatica present  Muscle weakness (generalized)     Problem List Patient Active Problem List   Diagnosis Date Noted   History of hepatitis C 01/31/2020   Cirrhosis of liver without ascites ([place]) 01/31/2020   Dilation of biliary tract 01/31/2020   Pain of upper abdomen 01/31/2020   Constipation 01/31/2020   Teena Irani, PTA/CLT [phone]  Roseanne Reno B 08/20/2020, 10:04 AM  [place] [address] [place], [place], [address] Phone: [phone]   Fax:  [phone]  Name: Joshua Maldonado MRN: [id number] Date of Birth: 18-Oct-1955

## 2020-08-22 ENCOUNTER — Ambulatory Visit (HOSPITAL_COMMUNITY): Payer: Medicaid Other | Admitting: Physical Therapy

## 2020-08-22 ENCOUNTER — Other Ambulatory Visit: Payer: Self-pay

## 2020-08-22 DIAGNOSIS — M545 Low back pain, unspecified: Secondary | ICD-10-CM | POA: Diagnosis not present

## 2020-08-22 DIAGNOSIS — R2689 Other abnormalities of gait and mobility: Secondary | ICD-10-CM

## 2020-08-22 DIAGNOSIS — M6281 Muscle weakness (generalized): Secondary | ICD-10-CM

## 2020-08-22 NOTE — Therapy (Signed)
[place], [place], [address] Phone: [phone]   Fax:  [phone]  Physical Therapy Treatment  Patient Details  Name: Joshua Maldonado MRN: [id number] Date of Birth: 30-May-1956 Referring Provider (PT): Barton Fanny NP   Encounter Date: 08/22/2020   PT End of Session - 08/22/20 0942    Visit Number 5    Number of Visits 12    Date for PT Re-Evaluation 09/13/20    Authorization Type Medicaid UHC    Authorization - Visit Number 5    Authorization - Number of Visits 27    PT Start Time 0920    PT Stop Time 1000    PT Time Calculation (min) 40 min    Activity Tolerance Patient limited by fatigue    Behavior During Therapy [facility] for tasks assessed/performed           Past Medical History:  Diagnosis Date  . Cirrhosis ([place])    Per patient, diagnosed 3-4 years ago at [facility].  Reports liver biopsy at [facility].  Suspected to be secondary to hepatitis C.  . Gout   . Hepatitis C    Per patient, s/p treatment with Harvoni at [facility]  . HLD (hyperlipidemia)   . Hypertension   . Sleep apnea     Past Surgical History:  Procedure Laterality Date  . BACK SURGERY    . COLONOSCOPY    . LIVER BIOPSY      There were no vitals filed for this visit.   Subjective Assessment - 08/22/20 0927    Subjective Pt states he is feeling a little better today with pain of 5/10 (was 9/10 last visit).  States he has not fallen out of bed since last visit.    Currently in Pain? Yes    Pain Score 5     Pain Location Back    Pain Orientation Lower    Pain Descriptors / Indicators Aching;Constant;Sore                             [facility] Adult PT Treatment/Exercise - 08/22/20 0001      Lumbar Exercises: Stretches   Single Knee to Chest Stretch 5 reps;10 seconds    Lower Trunk Rotation 5 reps    Other Lumbar Stretch Exercise corner chest stretch 3X20      Lumbar Exercises: Standing   Heel  Raises 20 reps    Other Standing Lumbar Exercises hip abduction and extension 15 reps    Other Standing Lumbar Exercises UE flexion against wall 10X, against wall W-back retraction 10X      Lumbar Exercises: Seated   Sit to Stand 10 reps    Sit to Stand Limitations no UE's standard height    Other Seated Lumbar Exercises pulling into good posture and holding x 3 deep breaths x  10       Lumbar Exercises: Supine   Ab Set 20 reps    Bridge 20 reps    Straight Leg Raise 15 reps    Straight Leg Raises Limitations with proper breathing, correct posturing.                    PT Short Term Goals - 08/06/20 1328      PT SHORT TERM GOAL #1   Title Patient will be independent with initial HEP and self-management strategies to improve functional outcomes    Time 3  Period Weeks    Status On-going    Target Date 08/23/20      PT SHORT TERM GOAL #2   Title Patient will be able to perform stand x 5 in < 20 seconds to demonstrate improvement in functional mobility and reduced risk for falls.    Time 3    Period Weeks    Status On-going    Target Date 08/23/20             PT Long Term Goals - 08/06/20 1329      PT LONG TERM GOAL #1   Title Patient will be able to perform stand x 5 in < 15 seconds to demonstrate improvement in functional mobility and reduced risk for falls.    Time 6    Period Weeks    Status On-going      PT LONG TERM GOAL #2   Title Patient will be able to ambulate at least 325 feet during 2MWT with LRAD to demonstrate improved ability to perform functional mobility and associated tasks.    Time 6    Period Weeks    Status On-going      PT LONG TERM GOAL #3   Title Patient will report at least 50% overall improvement in subjective complaint to indicate improvement in ability to perform ADLs.    Time 6    Period Weeks    Status On-going      PT LONG TERM GOAL #4   Title Patient will be able to stand > 30 minutes with pain not to exceed 4/10 in  lumbar to improve ability to perform cooking and grooming ADLs.    Time 6    Period Weeks    Status On-going                 Plan - 08/22/20 1102    Clinical Impression Statement Pt with reduced pain today.  Able to progress standing LE strengthening including hip abd/ext and heelraises.   Increased reps of supine exercises.  Improved overall bed mobility and less pain behaviors during session today.  Cues required for general form with all therex, completing slow and controlled movements.  Pt tends to use momentum and no core control with therex.  Improved form with cues given.    Personal Factors and Comorbidities Comorbidity 1    Comorbidities HTN    Examination-Activity Limitations Stairs;Squat;Transfers;Locomotion Level;Stand    Stability/Clinical Decision Making Stable/Uncomplicated    Rehab Potential Fair    PT Frequency 2x / week    PT Duration 6 weeks    PT Treatment/Interventions Aquatic Therapy;Biofeedback;Cryotherapy;Fluidtherapy;Patient/family education;Manual techniques;Functional mobility training;Therapeutic activities;Manual lymph drainage;Energy conservation;Splinting;Spinal Manipulations;Dry needling;Scar mobilization;Passive range of motion;Orthotic Fit/Training;Vasopneumatic Device;Taping;Joint Manipulations;Neuromuscular re-education;Stair training;DME Instruction;Gait training;Balance training;Therapeutic exercise;Contrast Bath;Electrical Stimulation;Iontophoresis 4mg /ml Dexamethasone;ADLs/Self Care Home Management;Moist Heat;Traction;Ultrasound;Parrafin;Compression bandaging    PT Next Visit Plan Progress core and LE strengh as tolerated. Progress to gait and balance.  Begin tandem stance and vectors.    PT Home Exercise Plan 07/29/20: sit to stand; 11/2: sit to stand sitting posture, ab set, bridge, knee to chest and hamstring stretch    Consulted and Agree with Plan of Care Patient           Patient will benefit from skilled therapeutic intervention in order  to improve the following deficits and impairments:  Pain, Improper body mechanics, Difficulty walking, Decreased strength, Decreased activity tolerance, Decreased endurance, Impaired perceived functional ability, Abnormal gait  Visit Diagnosis: Other abnormalities of gait and mobility  Low back  pain, unspecified back pain laterality, unspecified chronicity, unspecified whether sciatica present  Muscle weakness (generalized)     Problem List Patient Active Problem List   Diagnosis Date Noted  . History of hepatitis C 01/31/2020  . Cirrhosis of liver without ascites ([place]) 01/31/2020  . Dilation of biliary tract 01/31/2020  . Pain of upper abdomen 01/31/2020  . Constipation 01/31/2020   Teena Irani, PTA/CLT [phone]  Teena Irani 08/22/2020, 11:05 AM  [place], [place], [address] Phone: [phone]   Fax:  [phone]  Name: Furkan Keenum MRN: [id number] Date of Birth: [birth year]

## 2020-08-26 ENCOUNTER — Encounter (HOSPITAL_COMMUNITY): Payer: Medicaid Other | Admitting: Physical Therapy

## 2020-08-27 ENCOUNTER — Telehealth: Payer: Self-pay | Admitting: Internal Medicine

## 2020-08-27 NOTE — Telephone Encounter (Signed)
See prior note from Dr. Gala Romney 08/12/20. Patient needs OV to r/s.   Called pt and made him aware. He is also aware of upcoming OV appt date/time

## 2020-08-27 NOTE — Telephone Encounter (Signed)
Patient called back to schedule his procedure   Michela Pitcher he had to cancel prior due to his blood pressure.  States he is in pain and needs it done

## 2020-08-28 ENCOUNTER — Ambulatory Visit (HOSPITAL_COMMUNITY): Payer: Medicaid Other | Admitting: Physical Therapy

## 2020-08-28 ENCOUNTER — Telehealth (HOSPITAL_COMMUNITY): Payer: Self-pay | Admitting: Physical Therapy

## 2020-08-28 NOTE — Telephone Encounter (Signed)
Pt did not show for morning appointment then arrived later in morning and RS for 2pm appointment.  Pt then did not show for this appointment.  Left message regarding [facility] policy and reminder for next appointment on Tuesday 11/30.   Teena Irani, PTA/CLT [phone]

## 2020-09-02 ENCOUNTER — Other Ambulatory Visit: Payer: Self-pay

## 2020-09-02 ENCOUNTER — Encounter (HOSPITAL_COMMUNITY): Payer: Self-pay

## 2020-09-02 ENCOUNTER — Emergency Department (HOSPITAL_COMMUNITY): Payer: Medicaid Other

## 2020-09-02 ENCOUNTER — Inpatient Hospital Stay (HOSPITAL_COMMUNITY)
Admission: EM | Admit: 2020-09-02 | Discharge: 2020-09-07 | DRG: 439 | Disposition: A | Discharge status: Home / Self Care | Payer: Medicaid Other | Attending: Family Medicine | Admitting: Family Medicine

## 2020-09-02 ENCOUNTER — Telehealth (HOSPITAL_COMMUNITY): Payer: Self-pay | Admitting: Physical Therapy

## 2020-09-02 DIAGNOSIS — N179 Acute kidney failure, unspecified: Secondary | ICD-10-CM | POA: Diagnosis not present

## 2020-09-02 DIAGNOSIS — M79671 Pain in right foot: Secondary | ICD-10-CM | POA: Diagnosis present

## 2020-09-02 DIAGNOSIS — K7460 Unspecified cirrhosis of liver: Secondary | ICD-10-CM | POA: Diagnosis not present

## 2020-09-02 DIAGNOSIS — K8500 Idiopathic acute pancreatitis without necrosis or infection: Secondary | ICD-10-CM | POA: Diagnosis not present

## 2020-09-02 DIAGNOSIS — I129 Hypertensive chronic kidney disease with stage 1 through stage 4 chronic kidney disease, or unspecified chronic kidney disease: Secondary | ICD-10-CM | POA: Diagnosis present

## 2020-09-02 DIAGNOSIS — I471 Supraventricular tachycardia: Secondary | ICD-10-CM | POA: Diagnosis present

## 2020-09-02 DIAGNOSIS — R748 Abnormal levels of other serum enzymes: Secondary | ICD-10-CM | POA: Diagnosis not present

## 2020-09-02 DIAGNOSIS — R109 Unspecified abdominal pain: Secondary | ICD-10-CM

## 2020-09-02 DIAGNOSIS — K859 Acute pancreatitis without necrosis or infection, unspecified: Secondary | ICD-10-CM | POA: Diagnosis not present

## 2020-09-02 DIAGNOSIS — Z20822 Contact with and (suspected) exposure to covid-19: Secondary | ICD-10-CM | POA: Diagnosis present

## 2020-09-02 DIAGNOSIS — Z79899 Other long term (current) drug therapy: Secondary | ICD-10-CM

## 2020-09-02 DIAGNOSIS — F17210 Nicotine dependence, cigarettes, uncomplicated: Secondary | ICD-10-CM | POA: Diagnosis present

## 2020-09-02 DIAGNOSIS — N1832 Chronic kidney disease, stage 3b: Secondary | ICD-10-CM | POA: Diagnosis present

## 2020-09-02 DIAGNOSIS — R1010 Upper abdominal pain, unspecified: Secondary | ICD-10-CM

## 2020-09-02 DIAGNOSIS — E878 Other disorders of electrolyte and fluid balance, not elsewhere classified: Secondary | ICD-10-CM | POA: Diagnosis present

## 2020-09-02 DIAGNOSIS — G4733 Obstructive sleep apnea (adult) (pediatric): Secondary | ICD-10-CM | POA: Diagnosis present

## 2020-09-02 DIAGNOSIS — R739 Hyperglycemia, unspecified: Secondary | ICD-10-CM

## 2020-09-02 DIAGNOSIS — E785 Hyperlipidemia, unspecified: Secondary | ICD-10-CM | POA: Diagnosis present

## 2020-09-02 DIAGNOSIS — R140 Abdominal distension (gaseous): Secondary | ICD-10-CM

## 2020-09-02 DIAGNOSIS — E871 Hypo-osmolality and hyponatremia: Secondary | ICD-10-CM | POA: Diagnosis present

## 2020-09-02 DIAGNOSIS — M109 Gout, unspecified: Secondary | ICD-10-CM | POA: Diagnosis present

## 2020-09-02 DIAGNOSIS — N189 Chronic kidney disease, unspecified: Secondary | ICD-10-CM | POA: Diagnosis not present

## 2020-09-02 DIAGNOSIS — I10 Essential (primary) hypertension: Secondary | ICD-10-CM

## 2020-09-02 DIAGNOSIS — K5900 Constipation, unspecified: Secondary | ICD-10-CM | POA: Diagnosis present

## 2020-09-02 DIAGNOSIS — E782 Mixed hyperlipidemia: Secondary | ICD-10-CM

## 2020-09-02 LAB — COMPREHENSIVE METABOLIC PANEL
ALT: 31 U/L (ref 0–44)
AST: 31 U/L (ref 15–41)
Albumin: 3.8 g/dL (ref 3.5–5.0)
Alkaline Phosphatase: 87 U/L (ref 38–126)
Anion gap: 12 (ref 5–15)
BUN: 26 mg/dL — ABNORMAL HIGH (ref 8–23)
CO2: 29 mmol/L (ref 22–32)
Calcium: 9.3 mg/dL (ref 8.9–10.3)
Chloride: 91 mmol/L — ABNORMAL LOW (ref 98–111)
Creatinine, Ser: 1.94 mg/dL — ABNORMAL HIGH (ref 0.61–1.24)
GFR, Estimated: 38 mL/min — ABNORMAL LOW (ref >60)
Glucose, Bld: 205 mg/dL — ABNORMAL HIGH (ref 70–99)
Potassium: 3.6 mmol/L (ref 3.5–5.1)
Sodium: 132 mmol/L — ABNORMAL LOW (ref 135–145)
Total Bilirubin: 0.8 mg/dL (ref 0.3–1.2)
Total Protein: 9 g/dL — ABNORMAL HIGH (ref 6.5–8.1)

## 2020-09-02 LAB — CBC
HCT: 49.6 % (ref 39.0–52.0)
Hemoglobin: 16.6 g/dL (ref 13.0–17.0)
MCH: 30.1 pg (ref 26.0–34.0)
MCHC: 33.5 g/dL (ref 30.0–36.0)
MCV: 89.9 fL (ref 80.0–100.0)
Platelets: 200 10*3/uL (ref 150–400)
RBC: 5.52 MIL/uL (ref 4.22–5.81)
RDW: 12.4 % (ref 11.5–15.5)
WBC: 8.5 10*3/uL (ref 4.0–10.5)
nRBC: 0 % (ref 0.0–0.2)

## 2020-09-02 LAB — URINALYSIS, ROUTINE W REFLEX MICROSCOPIC
Bacteria, UA: NONE SEEN
Bilirubin Urine: NEGATIVE
Glucose, UA: NEGATIVE mg/dL
Hgb urine dipstick: NEGATIVE
Ketones, ur: NEGATIVE mg/dL
Leukocytes,Ua: NEGATIVE
Nitrite: NEGATIVE
Protein, ur: >=300 mg/dL — AB
Specific Gravity, Urine: 1.015 (ref 1.005–1.030)
pH: 8 (ref 5.0–8.0)

## 2020-09-02 LAB — RESP PANEL BY RT-PCR (FLU A&B, COVID) ARPGX2
Influenza A by PCR: NEGATIVE
Influenza B by PCR: NEGATIVE
SARS Coronavirus 2 by RT PCR: NEGATIVE

## 2020-09-02 LAB — LIPASE, BLOOD: Lipase: 292 U/L — ABNORMAL HIGH (ref 11–51)

## 2020-09-02 MED ORDER — ATORVASTATIN CALCIUM 10 MG PO TABS
10.0000 mg | ORAL_TABLET | Freq: Every day | ORAL | Status: DC
  Administered 2020-09-03 – 2020-09-07 (×5): 10 mg via ORAL
  Filled 2020-09-02 (×5): qty 1

## 2020-09-02 MED ORDER — MORPHINE SULFATE (PF) 2 MG/ML IV SOLN
2.0000 mg | INTRAVENOUS | Status: DC | PRN
  Administered 2020-09-02 – 2020-09-07 (×16): 2 mg via INTRAVENOUS
  Filled 2020-09-02 (×16): qty 1

## 2020-09-02 MED ORDER — HEPARIN SODIUM (PORCINE) 5000 UNIT/ML IJ SOLN
5000.0000 [IU] | Freq: Three times a day (TID) | INTRAMUSCULAR | Status: DC
  Administered 2020-09-02 – 2020-09-07 (×13): 5000 [IU] via SUBCUTANEOUS
  Filled 2020-09-02 (×13): qty 1

## 2020-09-02 MED ORDER — LACTATED RINGERS IV SOLN: Freq: Once | INTRAVENOUS | Status: AC

## 2020-09-02 MED ORDER — LISINOPRIL 10 MG PO TABS: 20.0000 mg | ORAL_TABLET | Freq: Every day | ORAL | Status: DC

## 2020-09-02 MED ORDER — ONDANSETRON HCL 4 MG/2ML IJ SOLN
4.0000 mg | Freq: Four times a day (QID) | INTRAMUSCULAR | Status: DC | PRN
  Administered 2020-09-03 (×2): 4 mg via INTRAVENOUS
  Filled 2020-09-02 (×2): qty 2

## 2020-09-02 MED ORDER — LACTATED RINGERS IV BOLUS
1000.0000 mL | Freq: Once | INTRAVENOUS | Status: AC
  Administered 2020-09-02: 1000 mL via INTRAVENOUS

## 2020-09-02 MED ORDER — PANTOPRAZOLE SODIUM 40 MG IV SOLR
40.0000 mg | INTRAVENOUS | Status: DC
  Administered 2020-09-02 – 2020-09-06 (×5): 40 mg via INTRAVENOUS
  Filled 2020-09-02 (×5): qty 40

## 2020-09-02 MED ORDER — PANTOPRAZOLE SODIUM 40 MG IV SOLR
40.0000 mg | Freq: Once | INTRAVENOUS | Status: AC
  Administered 2020-09-02: 40 mg via INTRAVENOUS
  Filled 2020-09-02: qty 40

## 2020-09-02 MED ORDER — MORPHINE SULFATE (PF) 2 MG/ML IV SOLN
2.0000 mg | Freq: Once | INTRAVENOUS | Status: AC
  Administered 2020-09-02: 2 mg via INTRAVENOUS
  Filled 2020-09-02: qty 1

## 2020-09-02 NOTE — ED Triage Notes (Signed)
Pt to er, pt c/o abd pain, states that he has been hurting for the past week.  Pt states that he called his GI doc but they couldn't see him till December.  Pt denies vomiting, reports lots of constipation and burping.

## 2020-09-02 NOTE — H&P (Signed)
History and Physical  Jaykwon Morones GBT:[id number] DOB: Mar 21, 1956 DOA: 09/02/2020  Referring physician: Rayna Sexton, PA-C PCP: Alanson Puls [facility]  Patient coming from: Home  Chief Complaint: Abdominal Pain  HPI: Duc Crocket is a 64 y.o. male with medical history significant for hypertension, hyperlipidemia, cirrhosis secondary to hepatitis C who presents to the emergency department due to 5-day onset of progressive abdominal pain.  Abdominal pain was in the epigastric area, it was nonradiating and was rated as 8/10 on pain scale.  Pain was aggravated with movement, it was minimal when he lies still in bed.  This was associated with reduced bowel movement (usually after 3 bowel movements daily, now has 1 BM/day which is harder/smaller than normal).  He has a follow-up with Dr. Gala Romney on 12/28.  Patient denies nausea, vomiting, diarrhea, fever, chills, chest pain, shortness of breath.  Patient states he only drinks alcohol occasionally but endorsed smoking 1 pack of cigarettes per week.  ED Course:  In the emergency department, he was hemodynamically stable.  BP was 152/115.  Work-up in the ED showed normal CBC, hyponatremia, hyperglycemia, BUN to creatinine 36/1.94 (baseline creatinine at 1.5-1.8).  Lipase 292. CT abdomen and pelvis without contrast showed acute pancreatitis and cirrhosis Patient was provided with IV hydration, IV Protonix 40 mg was given and IV morphine 2 mg x 2 was given.  Hospitalist was asked to admit patient for further evaluation and management.  Review of Systems: Constitutional: Negative for chills and fever.  HENT: Negative for ear pain and sore throat.   Eyes: Negative for pain and visual disturbance.  Respiratory: Negative for cough, chest tightness and shortness of breath.   Cardiovascular: Negative for chest pain and palpitations.  Gastrointestinal: Positive for abdominal pain.  Negative for vomiting.  Endocrine: Negative for polyphagia and polyuria.   Genitourinary: Negative for decreased urine volume, dysuria, enuresis Musculoskeletal: Negative for arthralgias and back pain.  Skin: Negative for color change and rash.  Allergic/Immunologic: Negative for immunocompromised state.  Neurological: Negative for tremors, syncope, speech difficulty, weakness, light-headedness and headaches.  Hematological: Does not bruise/bleed easily.  All other systems reviewed and are negative    Past Medical History:  Diagnosis Date  . Cirrhosis ([place])    Per patient, diagnosed 3-4 years ago at [facility].  Reports liver biopsy at [facility].  Suspected to be secondary to hepatitis C.  . Gout   . Hepatitis C    Per patient, s/p treatment with Harvoni at [facility]  . HLD (hyperlipidemia)   . Hypertension   . Sleep apnea    Past Surgical History:  Procedure Laterality Date  . BACK SURGERY    . COLONOSCOPY    . LIVER BIOPSY      Social History:  reports that he has been smoking cigarettes. He has a 3.00 pack-year smoking history. He has never used smokeless tobacco. He reports current alcohol use of about 5.0 standard drinks of alcohol per week. He reports that he does not use drugs.   No Known Allergies  Family History  Problem Relation Age of Onset  . Colon cancer Neg Hx      Prior to Admission medications   Medication Sig Start Date End Date Taking? Authorizing Provider  allopurinol (ZYLOPRIM) 100 MG tablet Take 100 mg by mouth daily as needed (gout).  04/05/20  Yes [provider]  atorvastatin (LIPITOR) 10 MG tablet Take 10 mg by mouth daily.   Yes [provider]  cyclobenzaprine (FLEXERIL) 10 MG  tablet Take 10 mg by mouth 3 (three) times daily as needed for muscle spasms. 07/29/20  Yes [provider]  DULoxetine (CYMBALTA) 30 MG capsule Take 30 mg by mouth daily. 07/12/20  Yes [provider]  fluticasone (FLONASE) 50 MCG/ACT nasal spray Place 1 spray into both nostrils daily as needed for  allergies or rhinitis.   Yes [provider]  gabapentin (NEURONTIN) 600 MG tablet Take 600 mg by mouth 3 (three) times daily.   Yes [provider]  hydrochlorothiazide (HYDRODIURIL) 25 MG tablet Take 25 mg by mouth daily.   Yes [provider]  lisinopril (ZESTRIL) 20 MG tablet Take 20 mg by mouth daily.    Yes [provider]  Multiple Vitamin (MULTIVITAMIN WITH MINERALS) TABS tablet Take 1 tablet by mouth daily.   Yes [provider]  pantoprazole (PROTONIX) 40 MG tablet TAKE 1 TABLET (40 MG TOTAL) BY MOUTH 2 (TWO) TIMES DAILY BEFORE A MEAL. 08/07/20 08/07/21 Yes Carlis Stable, NP  sucralfate (CARAFATE) 1 g tablet TAKE 1 TABLET (1 G TOTAL) BY MOUTH 4 (FOUR) TIMES DAILY - WITH MEALS AND AT BEDTIME. Patient taking differently: Take 1 g by mouth daily.  05/01/20 05/01/21 Yes Annitta Needs, NP  Vitamin D, Ergocalciferol, (DRISDOL) 1.25 MG (50000 UNIT) CAPS capsule Take 50,000 Units by mouth every 7 (seven) days.    Yes [provider]    Physical Exam: BP (!) 164/119   Pulse 72   Temp 98.1 F (36.7 C) (Oral)   Resp 20   Ht 5\' 8"  (1.727 m)   Wt 104.3 kg   SpO2 98%   BMI 34.97 kg/m   . General: 64 y.o. year-old male well developed well nourished in no acute distress.  Alert and oriented x3. [name] HEENT: NCAT, EOMI . Neck: Supple, trachea medial . Cardiovascular: Regular rate and rhythm with no rubs or gallops.  No thyromegaly or JVD noted.  No lower extremity edema. 2/4 pulses in all 4 extremities. [name] Respiratory: Clear to auscultation with no wheezes or rales. Good inspiratory effort. . Abdomen: Soft, tender to palpation of epigastrium, mildly distended with normal bowel sounds x4 quadrants. . Muskuloskeletal: No cyanosis, clubbing or edema noted bilaterally . Neuro: CN II-XII intact, strength, sensation, reflexes . Skin: No ulcerative lesions noted or rashes . Psychiatry: Judgement and insight appear normal. Mood is appropriate for condition  and setting          Labs on Admission:  Basic Metabolic Panel: Recent Labs  Lab 09/02/20 1345  NA 132*  K 3.6  CL 91*  CO2 29  GLUCOSE 205*  BUN 26*  CREATININE 1.94*  CALCIUM 9.3   Liver Function Tests: Recent Labs  Lab 09/02/20 1345  AST 31  ALT 31  ALKPHOS 87  BILITOT 0.8  PROT 9.0*  ALBUMIN 3.8   Recent Labs  Lab 09/02/20 1345  LIPASE 292*   No results for input(s): AMMONIA in the last 168 hours. CBC: Recent Labs  Lab 09/02/20 1345  WBC 8.5  HGB 16.6  HCT 49.6  MCV 89.9  PLT 200   Cardiac Enzymes: No results for input(s): CKTOTAL, CKMB, CKMBINDEX, TROPONINI in the last 168 hours.  BNP (last 3 results) No results for input(s): BNP in the last 8760 hours.  ProBNP (last 3 results) No results for input(s): PROBNP in the last 8760 hours.  CBG: No results for input(s): GLUCAP in the last 168 hours.  Radiological Exams on Admission: CT ABDOMEN PELVIS WO  CONTRAST  Result Date: 09/02/2020 CLINICAL DATA:  64 year old male with abdominal pain. EXAM: CT ABDOMEN AND PELVIS WITHOUT CONTRAST TECHNIQUE: Multidetector CT imaging of the abdomen and pelvis was performed following the standard protocol without IV contrast. COMPARISON:  CT dated 01/27/2020. FINDINGS: Evaluation of this exam is limited in the absence of intravenous contrast. Lower chest: There are bibasilar interstitial coarsening and atelectasis. No intra-abdominal free air or free fluid. Hepatobiliary: Cirrhosis. No intrahepatic biliary dilatation. No calcified gallstone or pericholecystic fluid. Pancreas: There is inflammatory changes of the pancreas consistent with acute pancreatitis. Correlation with pancreatic enzymes recommended. No drainable fluid collection/abscess or pseudocyst. Spleen: Normal in size without focal abnormality. Adrenals/Urinary Tract: The adrenal glands unremarkable. The kidneys, visualized ureters, and urinary bladder appear unremarkable. Stomach/Bowel: There is moderate stool  throughout the colon. There is no bowel obstruction or active inflammation. The appendix is normal. Vascular/Lymphatic: Mild aortoiliac atherosclerotic disease. The IVC is unremarkable. No portal venous gas. Top-normal retroperitoneal lymph nodes, likely reactive. Reproductive: The prostate and seminal vesicles are grossly unremarkable. No pelvic mass. Other: Small fat containing umbilical hernia. Musculoskeletal: Degenerative changes of the spine. Lumbar posterior fusion. No acute osseous pathology. IMPRESSION: 1. Acute pancreatitis. No abscess or pseudocyst. 2. Cirrhosis. 3. Aortic Atherosclerosis (ICD10-I70.0). Electronically Signed   By: Anner Crete M.D.   On: 09/02/2020 18:16    EKG: I independently viewed the EKG done and my findings are as followed: Sinus rhythm at rate of 85 bpm with PVCs  Assessment/Plan Present on Admission: . Acute pancreatitis . Cirrhosis of liver without ascites (HCC)  Principal Problem:   Acute pancreatitis Active Problems:   Cirrhosis of liver without ascites (HCC)   Abdominal pain   Essential hypertension   Hyperlipidemia   Acute kidney injury superimposed on CKD (HCC)   Hyperglycemia   Elevated lipase  Abdominal pain secondary to acute pancreatitis  Elevated lipase level CT abdomen and pelvis showed acute pancreatitis Lipase level was elevated at 292 (this was 115 in May 2021) Continue IV Zofran 4 mg every 6 hours p.r.n Continue IV morphine 2 mg every 4 hours p.r.n for pain Continue IV Protonix 40 mg daily Continue IV LR at 161ml/Hr Continue full liquid diet with plan to advance diet as tolerated RUQ U/S in the morning to investigate biliary etiology (gallstone and bile duct dilatation)  Chronic CKD stage IIIb BUN to creatinine 36/1.94 (baseline creatinine at 1.5-1.8 Renally adjust medications, avoid nephrotoxic agents/dehydration/hypotension  Hyperglycemia with no known history of type 2 diabetes mellitus CBG = 205; Hemoglobin A1c will be  checked Continue to monitor blood glucose levels  Essential hypertension Continue lisinopril HCTZ will be held at this time due to increased creatinine level  Hyperlipidemia Continue statin per home regimen  History of cirrhosis of liver RUQ ultrasound as described above   DVT prophylaxis: Heparin subcu  Code Status: Full code  Family Communication: None at bedside  Disposition Plan: Patient is from:                        home Anticipated DC to:                   SNF or family members home Anticipated DC date:               2-3 days Anticipated DC barriers:          Patient is unstable to be discharged at this time due to acute pancreatitis which require further studies in  the morning  Consults called: None  Admission status: Inpatient   Bernadette Hoit MD [facility]  09/02/2020, 9:43 PM

## 2020-09-02 NOTE — ED Notes (Signed)
Notifed Dr. Karle Starch and PA Vernia Buff that pts. Heart rate is 177.

## 2020-09-02 NOTE — ED Provider Notes (Signed)
[facility] EMERGENCY DEPARTMENT Provider Note   CSN: [id number] Arrival date & time: 09/02/20  1108     History Chief Complaint  Patient presents with  . Abdominal Pain   Joshua Maldonado is a 64 y.o. male.  HPI   Patient is a 64 year old male with a history of cirrhosis secondary to hepatitis C, hyperlipidemia, hypertension, dilation of the biliary tract, who presents to the emergency department due to abdominal pain.  Patient states his current symptoms started about 5 days ago and have been worsening.  Feels that his pain is worst along the epigastrium.  Reports associated constipation and states he is having daily BMs but they are harder/smaller than normal. Denies n/v/d. Patient has a follow-up appointment with Dr. Gala Romney on 12/28. No fevers, chills, CP, SOB, urinary changes, hematochezia. He states he drinks very occasionally. Smokes about 1 PPW.   MRCP on 02/11/20 due to concern for developing pancreatitis due to biliary tree or gastritis/PUD secondary to NSAID use:  IMPRESSION: 1. Smooth tapering of the common bile duct through the pancreas is favored to represent benign variation. No obstructing lesion identified on noncontrast exam. 2. Normal gallbladder. 3. Lobular liver consistent with cirrhosis.     Past Medical History:  Diagnosis Date  . Cirrhosis ([place])    Per patient, diagnosed 3-4 years ago at [facility].  Reports liver biopsy at [facility].  Suspected to be secondary to hepatitis C.  . Gout   . Hepatitis C    Per patient, s/p treatment with Harvoni at [facility]  . HLD (hyperlipidemia)   . Hypertension   . Sleep apnea     Patient Active Problem List   Diagnosis Date Noted  . History of hepatitis C 01/31/2020  . Cirrhosis of liver without ascites ([place]) 01/31/2020  . Dilation of biliary tract 01/31/2020  . Pain of upper abdomen 01/31/2020  . Constipation 01/31/2020    Past Surgical History:  Procedure Laterality Date  . BACK SURGERY    .  COLONOSCOPY    . LIVER BIOPSY       Family History  Problem Relation Age of Onset  . Colon cancer Neg Hx     Social History   Tobacco Use  . Smoking status: Current Every Day Smoker    Packs/day: 0.25    Years: 12.00    Pack years: 3.00    Types: Cigarettes  . Smokeless tobacco: Never Used  Vaping Use  . Vaping Use: Never used  Substance Use Topics  . Alcohol use: Yes    Alcohol/week: 5.0 standard drinks    Types: 2 Cans of beer, 3 Shots of liquor per week    Comment: 3 drinks a week  . Drug use: Never    Home Medications Prior to Admission medications   Medication Sig Start Date End Date Taking? Authorizing Provider  allopurinol (ZYLOPRIM) 100 MG tablet Take 100 mg by mouth daily as needed (gout).  04/05/20   [provider]  aspirin EC 81 MG tablet Take 81 mg by mouth daily. Patient not taking: Reported on 08/02/2020    [provider]  atorvastatin (LIPITOR) 10 MG tablet Take 10 mg by mouth daily.    [provider]  cyclobenzaprine (FLEXERIL) 10 MG tablet Take 10 mg by mouth 3 (three) times daily as needed for muscle spasms. 07/29/20   [provider]  DULoxetine (CYMBALTA) 30 MG capsule Take 30 mg by mouth daily. 07/12/20   [provider]  fluticasone (FLONASE) 50  MCG/ACT nasal spray Place 1 spray into both nostrils daily as needed for allergies or rhinitis.    [provider]  gabapentin (NEURONTIN) 600 MG tablet Take 600 mg by mouth 3 (three) times daily.    [provider]  hydrochlorothiazide (HYDRODIURIL) 25 MG tablet Take 25 mg by mouth daily.    [provider]  lisinopril (ZESTRIL) 20 MG tablet Take 20 mg by mouth daily.     [provider]  Multiple Vitamin (MULTIVITAMIN WITH MINERALS) TABS tablet Take 1 tablet by mouth daily.    [provider]  pantoprazole (PROTONIX) 40 MG tablet TAKE 1 TABLET (40 MG TOTAL) BY MOUTH 2 (TWO) TIMES DAILY BEFORE A MEAL. 08/07/20 08/07/21   Carlis Stable, NP  sucralfate (CARAFATE) 1 g tablet TAKE 1 TABLET (1 G TOTAL) BY MOUTH 4 (FOUR) TIMES DAILY - WITH MEALS AND AT BEDTIME. Patient taking differently: Take 1 g by mouth daily.  05/01/20 05/01/21  Annitta Needs, NP  Vitamin D, Ergocalciferol, (DRISDOL) 1.25 MG (50000 UNIT) CAPS capsule Take 50,000 Units by mouth every 7 (seven) days. Patient not taking: Reported on 08/02/2020    [provider]    Allergies    Patient has no known allergies.  Review of Systems   Review of Systems  All other systems reviewed and are negative.  Ten systems reviewed and are negative for acute change, except as noted in the HPI.    Physical Exam Updated Vital Signs BP (!) 164/111   Pulse 93   Temp 98.1 F (36.7 C) (Oral)   Resp (!) 23   Ht 5\' 8"  (1.727 m)   Wt 104.3 kg   SpO2 99%   BMI 34.97 kg/m   Physical Exam Vitals and nursing note reviewed.  Constitutional:      General: He is not in acute distress.    Appearance: Normal appearance. He is well-developed. He is not ill-appearing, toxic-appearing or diaphoretic.  HENT:     Head: Normocephalic and atraumatic.     Right Ear: External ear normal.     Left Ear: External ear normal.     Nose: Nose normal.     Mouth/Throat:     Mouth: Mucous membranes are moist.     Pharynx: Oropharynx is clear. No oropharyngeal exudate or posterior oropharyngeal erythema.  Eyes:     Extraocular Movements: Extraocular movements intact.  Cardiovascular:     Rate and Rhythm: Normal rate and regular rhythm.     Pulses: Normal pulses.     Heart sounds: Normal heart sounds. No murmur heard.  No friction rub. No gallop.   Pulmonary:     Effort: Pulmonary effort is normal. No respiratory distress.     Breath sounds: Normal breath sounds. No stridor. No wheezing, rhonchi or rales.  Abdominal:     General: Abdomen is flat and protuberant. There is distension (mild).     Palpations: Abdomen is soft.     Tenderness: There is generalized  abdominal tenderness.     Comments: Mildly distended abdomen.  Abdomen is soft.  Diffuse tenderness that appears to be worst along the epigastrium.  Musculoskeletal:        General: Normal range of motion.     Cervical back: Normal range of motion and neck supple. No tenderness.  Skin:    General: Skin is warm and dry.  Neurological:     General: No focal deficit present.     Mental Status: He is alert and oriented  to person, place, and time.  Psychiatric:        Mood and Affect: Mood normal.        Behavior: Behavior normal.    ED Results / Procedures / Treatments   Labs (all labs ordered are listed, but only abnormal results are displayed) Labs Reviewed  LIPASE, BLOOD - Abnormal; Notable for the following components:      Result Value   Lipase 292 (*)    All other components within normal limits  COMPREHENSIVE METABOLIC PANEL - Abnormal; Notable for the following components:   Sodium 132 (*)    Chloride 91 (*)    Glucose, Bld 205 (*)    BUN 26 (*)    Creatinine, Ser 1.94 (*)    Total Protein 9.0 (*)    GFR, Estimated 38 (*)    All other components within normal limits  URINALYSIS, ROUTINE W REFLEX MICROSCOPIC - Abnormal; Notable for the following components:   Protein, ur >=300 (*)    All other components within normal limits  RESP PANEL BY RT-PCR (FLU A&B, COVID) [id number]  CBC   EKG EKG Interpretation  Date/Time:  Monday September 02 2020 16:18:56 EST Ventricular Rate:  174 PR Interval:  144 QRS Duration: 83 QT Interval:  304 QTC Calculation: 518 R Axis:   -3 Text Interpretation: Supraventricular tachycardia Abnormal R-wave progression, late transition Nonspecific T abnormalities, diffuse leads Baseline wander in lead(s) V6 Since last tracing Supraventricular tachycardia has replaced Sinus rhythm Confirmed by Calvert Cantor [phone]) on 09/02/2020 4:30:02 PM  Radiology CT ABDOMEN PELVIS WO CONTRAST  Result Date: 09/02/2020 CLINICAL DATA:  64 year old male with  abdominal pain. EXAM: CT ABDOMEN AND PELVIS WITHOUT CONTRAST TECHNIQUE: Multidetector CT imaging of the abdomen and pelvis was performed following the standard protocol without IV contrast. COMPARISON:  CT dated 01/27/2020. FINDINGS: Evaluation of this exam is limited in the absence of intravenous contrast. Lower chest: There are bibasilar interstitial coarsening and atelectasis. No intra-abdominal free air or free fluid. Hepatobiliary: Cirrhosis. No intrahepatic biliary dilatation. No calcified gallstone or pericholecystic fluid. Pancreas: There is inflammatory changes of the pancreas consistent with acute pancreatitis. Correlation with pancreatic enzymes recommended. No drainable fluid collection/abscess or pseudocyst. Spleen: Normal in size without focal abnormality. Adrenals/Urinary Tract: The adrenal glands unremarkable. The kidneys, visualized ureters, and urinary bladder appear unremarkable. Stomach/Bowel: There is moderate stool throughout the colon. There is no bowel obstruction or active inflammation. The appendix is normal. Vascular/Lymphatic: Mild aortoiliac atherosclerotic disease. The IVC is unremarkable. No portal venous gas. Top-normal retroperitoneal lymph nodes, likely reactive. Reproductive: The prostate and seminal vesicles are grossly unremarkable. No pelvic mass. Other: Small fat containing umbilical hernia. Musculoskeletal: Degenerative changes of the spine. Lumbar posterior fusion. No acute osseous pathology. IMPRESSION: 1. Acute pancreatitis. No abscess or pseudocyst. 2. Cirrhosis. 3. Aortic Atherosclerosis (ICD10-I70.0). Electronically Signed   By: Anner Crete M.D.   On: 09/02/2020 18:16   Procedures Procedures   Medications Ordered in ED Medications  morphine 2 MG/ML injection 2 mg (2 mg Intravenous Given 09/02/20 1637)  pantoprazole (PROTONIX) injection 40 mg (40 mg Intravenous Given 09/02/20 1630)  lactated ringers bolus 1,000 mL (1,000 mLs Intravenous New Bag/Given  09/02/20 1933)  morphine 2 MG/ML injection 2 mg (2 mg Intravenous Given 09/02/20 1914)   ED Course  I have reviewed the triage vital signs and the nursing notes.  Pertinent labs & imaging results that were available during my care of the patient were reviewed by me and considered in my medical  decision making (see chart for details).  Clinical Course as of Sep 02 1902  [facility] Sep 02, 2020  1550 Slowly elevating throughout the year when compared to multiple priors.  Lipase(!): 292 [LJ]  1550 Slowly elevating throughout the year when compared to multiple priors.  Was 1.88, 3 weeks ago.  Creatinine(!): 1.94 [LJ]  1636 Patient had an episode of SVT.  Myself as well as my attending physician were notified.  I evaluated the patient and he spontaneously converted back to normal sinus rhythm.  Heart rate currently in the high 90s.   [LJ]  1833 IMPRESSION: 1. Acute pancreatitis. No abscess or pseudocyst. 2. Cirrhosis. 3. Aortic Atherosclerosis (ICD10-I70.0).  CT ABDOMEN PELVIS WO CONTRAST [LJ]    Clinical Course User Index [LJ] Rayna Sexton, PA-C   MDM Rules/Calculators/A&P                          Patient is a 64 year old male who presents to the emergency department with worsening upper abdominal pain as well as constipation.  Patient had an MRCP performed earlier this year due to a possible developing pancreatitis at that time.  Findings noted above in the HPI.  Patient reach back out to GI regarding his current symptoms and has an appointment later this month.  I obtained basic labs on the patient.  CBC without leukocytosis.  Elevated lipase at 292 which appears to be trending upwards throughout the year.  CMP shows mild hyponatremia and hypochloremia.  Hyperglycemia 205.  Creatinine elevated at 1.94 with a GFR of 38.  Kidney function also seems to be worsening progressively throughout this year.  UA shows greater than 300 protein.  Given the severity of his symptoms today he was given  morphine as well as Protonix.  I obtained another CT showing an acute pancreatitis without abscess or pseudocyst.  Chronic cirrhosis.  Recommended admission to the patient for fluids, pain management, further work-up.  He is amenable with this plan.  We will start on lactated Ringer's and given additional dose of morphine.  Patient discussed with the hospitalist team who will accept him into their care at this time.  Final Clinical Impression(s) / ED Diagnoses Final diagnoses:  Acute pancreatitis, unspecified complication status, unspecified pancreatitis type   Rx / DC Orders ED Discharge Orders    None       Rayna Sexton, PA-C 09/02/20 2004    Truddie Hidden, MD 09/02/20 2039

## 2020-09-02 NOTE — ED Notes (Signed)
Patient transported to CT 

## 2020-09-02 NOTE — Telephone Encounter (Signed)
pt cancelled appts for 11/30 and 12/2 because he has been admitted to the hospital

## 2020-09-03 ENCOUNTER — Encounter (HOSPITAL_COMMUNITY): Payer: Self-pay | Admitting: Internal Medicine

## 2020-09-03 ENCOUNTER — Inpatient Hospital Stay (HOSPITAL_COMMUNITY): Payer: Medicaid Other

## 2020-09-03 ENCOUNTER — Ambulatory Visit (HOSPITAL_COMMUNITY): Payer: Medicaid Other | Admitting: Physical Therapy

## 2020-09-03 DIAGNOSIS — K859 Acute pancreatitis without necrosis or infection, unspecified: Principal | ICD-10-CM

## 2020-09-03 LAB — APTT: aPTT: 28 seconds (ref 24–36)

## 2020-09-03 LAB — CBC
HCT: 47.6 % (ref 39.0–52.0)
Hemoglobin: 16.1 g/dL (ref 13.0–17.0)
MCH: 30.5 pg (ref 26.0–34.0)
MCHC: 33.8 g/dL (ref 30.0–36.0)
MCV: 90.2 fL (ref 80.0–100.0)
Platelets: 204 10*3/uL (ref 150–400)
RBC: 5.28 MIL/uL (ref 4.22–5.81)
RDW: 12.2 % (ref 11.5–15.5)
WBC: 8.3 10*3/uL (ref 4.0–10.5)
nRBC: 0 % (ref 0.0–0.2)

## 2020-09-03 LAB — COMPREHENSIVE METABOLIC PANEL
ALT: 25 U/L (ref 0–44)
AST: 23 U/L (ref 15–41)
Albumin: 3.5 g/dL (ref 3.5–5.0)
Alkaline Phosphatase: 76 U/L (ref 38–126)
Anion gap: 12 (ref 5–15)
BUN: 24 mg/dL — ABNORMAL HIGH (ref 8–23)
CO2: 28 mmol/L (ref 22–32)
Calcium: 9.3 mg/dL (ref 8.9–10.3)
Chloride: 93 mmol/L — ABNORMAL LOW (ref 98–111)
Creatinine, Ser: 1.82 mg/dL — ABNORMAL HIGH (ref 0.61–1.24)
GFR, Estimated: 41 mL/min — ABNORMAL LOW (ref >60)
Glucose, Bld: 146 mg/dL — ABNORMAL HIGH (ref 70–99)
Potassium: 3.6 mmol/L (ref 3.5–5.1)
Sodium: 133 mmol/L — ABNORMAL LOW (ref 135–145)
Total Bilirubin: 1.1 mg/dL (ref 0.3–1.2)
Total Protein: 8.1 g/dL (ref 6.5–8.1)

## 2020-09-03 LAB — PHOSPHORUS: Phosphorus: 3.4 mg/dL (ref 2.5–4.6)

## 2020-09-03 LAB — PROTIME-INR
INR: 1 (ref 0.8–1.2)
Prothrombin Time: 13 seconds (ref 11.4–15.2)

## 2020-09-03 LAB — HIV ANTIBODY (ROUTINE TESTING W REFLEX): HIV Screen 4th Generation wRfx: NONREACTIVE

## 2020-09-03 LAB — MAGNESIUM: Magnesium: 1.8 mg/dL (ref 1.7–2.4)

## 2020-09-03 MED ORDER — HYDRALAZINE HCL 10 MG PO TABS: 10.0000 mg | ORAL_TABLET | Freq: Three times a day (TID) | ORAL | Status: DC

## 2020-09-03 MED ORDER — HYDRALAZINE HCL 10 MG PO TABS
10.0000 mg | ORAL_TABLET | Freq: Four times a day (QID) | ORAL | Status: DC | PRN
  Administered 2020-09-03: 10 mg via ORAL
  Filled 2020-09-03 (×3): qty 1

## 2020-09-03 MED ORDER — AMLODIPINE BESYLATE 5 MG PO TABS
10.0000 mg | ORAL_TABLET | Freq: Every day | ORAL | Status: DC
  Administered 2020-09-03 – 2020-09-07 (×5): 10 mg via ORAL
  Filled 2020-09-03 (×5): qty 2

## 2020-09-03 MED ORDER — METOPROLOL TARTRATE 25 MG PO TABS
25.0000 mg | ORAL_TABLET | Freq: Two times a day (BID) | ORAL | Status: DC
  Administered 2020-09-03 – 2020-09-04 (×2): 25 mg via ORAL
  Filled 2020-09-03 (×4): qty 1

## 2020-09-03 MED ORDER — LACTATED RINGERS IV SOLN: INTRAVENOUS | Status: AC

## 2020-09-03 MED ORDER — METOPROLOL TARTRATE 25 MG PO TABS: 25.0000 mg | ORAL_TABLET | Freq: Two times a day (BID) | ORAL | Status: DC

## 2020-09-03 MED ORDER — HYDRALAZINE HCL 25 MG PO TABS
25.0000 mg | ORAL_TABLET | Freq: Four times a day (QID) | ORAL | Status: DC | PRN
  Administered 2020-09-03 – 2020-09-04 (×3): 25 mg via ORAL
  Filled 2020-09-03 (×3): qty 1

## 2020-09-03 NOTE — Progress Notes (Signed)
Pt arrived to room, ambulated to bed without diff or assist using his cane. Oriented to room and safety measures, states understanding. Dinamap b/p not registering, manual B/P 172/122 by this nurse. Pt's heart rate 90's with PAC per telemetry. MD Netty advised of b/p via secure chat.  Proceeded with physical assessment and pt's heart rate auscultated but too fast to count. Received notification from [facility] that pt is in SVT 170's, then pt's rate slowed again to 90's. Pt A&O, no c/o SOB, skin warm and dry, only c/o right upper abd pain and belching. MD Netty notified again by [facility] page of b/p and episode of SVT. Awaiting orders.

## 2020-09-03 NOTE — ED Notes (Signed)
ED TO INPATIENT HANDOFF REPORT  ED Nurse Name and Phone #: [phone]  S Name/Age/Gender Joshua Maldonado 64 y.o. male Room/Bed: APA08/APA08  Code Status   Code Status: Full Code  Home/SNF/Other Home Patient oriented to: self, place, time and situation Is this baseline? Yes   Triage Complete: Triage complete  Chief Complaint Acute pancreatitis [K85.90]  Triage Note Pt to er, pt c/o abd pain, states that he has been hurting for the past week.  Pt states that he called his GI doc but they couldn't see him till December.  Pt denies vomiting, reports lots of constipation and burping.      Allergies No Known Allergies  Level of Care/Admitting Diagnosis ED Disposition    ED Disposition Condition [facility] Area: [facility] [[id number]]  Level of Care: Med-Surg [16]  Covid Evaluation: Asymptomatic Screening Protocol (No Symptoms)  Diagnosis: Acute pancreatitis [577.0.ICD-9-CM]  Admitting Physician: Bernadette Hoit [[id number]]  Attending Physician: Bernadette Hoit [[id number]]  Estimated length of stay: past midnight tomorrow  Certification:: I certify this patient will need inpatient services for at least 2 midnights       B Medical/Surgery History Past Medical History:  Diagnosis Date  . Cirrhosis ([place])    Per patient, diagnosed 3-4 years ago at [facility].  Reports liver biopsy at [facility].  Suspected to be secondary to hepatitis C.  . Gout   . Hepatitis C    Per patient, s/p treatment with Harvoni at [facility]  . HLD (hyperlipidemia)   . Hypertension   . Sleep apnea    Past Surgical History:  Procedure Laterality Date  . BACK SURGERY    . COLONOSCOPY    . LIVER BIOPSY       A IV Location/Drains/Wounds Patient Lines/Drains/Airways Status    Active Line/Drains/Airways    Name Placement date Placement time Site Days   Peripheral IV 09/02/20 Left Antecubital 09/02/20  1626  Antecubital  1          Intake/Output Last 24  hours No intake or output data in the 24 hours ending 09/03/20 1400  Labs/Imaging Results for orders placed or performed during the hospital encounter of 09/02/20 (from the past 48 hour(s))  Urinalysis, Routine w reflex microscopic     Status: Abnormal   Collection Time: 09/02/20 12:01 PM  Result Value Ref Range   Color, Urine YELLOW YELLOW   APPearance CLEAR CLEAR   Specific Gravity, Urine 1.015 1.005 - 1.030   pH 8.0 5.0 - 8.0   Glucose, UA NEGATIVE NEGATIVE mg/dL   Hgb urine dipstick NEGATIVE NEGATIVE   Bilirubin Urine NEGATIVE NEGATIVE   Ketones, ur NEGATIVE NEGATIVE mg/dL   Protein, ur >=300 (A) NEGATIVE mg/dL   Nitrite NEGATIVE NEGATIVE   Leukocytes,Ua NEGATIVE NEGATIVE   RBC / HPF 0-5 0 - 5 RBC/hpf   WBC, UA 0-5 0 - 5 WBC/hpf   Bacteria, UA NONE SEEN NONE SEEN   Squamous Epithelial / LPF 0-5 0 - 5    Comment: Performed at [facility], [address] [place], [place] [address]  Lipase, blood     Status: Abnormal   Collection Time: 09/02/20  1:45 PM  Result Value Ref Range   Lipase 292 (H) 11 - 51 U/L    Comment: Performed at [facility], [address], [place], [place] [address]  Comprehensive metabolic panel     Status: Abnormal   Collection Time: 09/02/20  1:45 PM  Result Value Ref Range  Sodium 132 (L) 135 - 145 mmol/L   Potassium 3.6 3.5 - 5.1 mmol/L   Chloride 91 (L) 98 - 111 mmol/L   CO2 29 22 - 32 mmol/L   Glucose, Bld 205 (H) 70 - 99 mg/dL    Comment: Glucose reference range applies only to samples taken after fasting for at least 8 hours.   BUN 26 (H) 8 - 23 mg/dL   Creatinine, Ser 1.94 (H) 0.61 - 1.24 mg/dL   Calcium 9.3 8.9 - 10.3 mg/dL   Total Protein 9.0 (H) 6.5 - 8.1 g/dL   Albumin 3.8 3.5 - 5.0 g/dL   AST 31 15 - 41 U/L   ALT 31 0 - 44 U/L   Alkaline Phosphatase 87 38 - 126 U/L   Total Bilirubin 0.8 0.3 - 1.2 mg/dL   GFR, Estimated 38 (L) >60 mL/min    Comment: (NOTE) Calculated using the CKD-EPI Creatinine Equation (2021)    Anion gap 12 5 -  15    Comment: Performed at [facility], [address] [place], [place] [address]  CBC     Status: None   Collection Time: 09/02/20  1:45 PM  Result Value Ref Range   WBC 8.5 4.0 - 10.5 K/uL   RBC 5.52 4.22 - 5.81 MIL/uL   Hemoglobin 16.6 13.0 - 17.0 g/dL   HCT 49.6 39 - 52 %   MCV 89.9 80.0 - 100.0 fL   MCH 30.1 26.0 - 34.0 pg   MCHC 33.5 30.0 - 36.0 g/dL   RDW 12.4 11.5 - 15.5 %   Platelets 200 150 - 400 K/uL   nRBC 0.0 0.0 - 0.2 %    Comment: Performed at [facility], [address], [place], [place] [address]  HIV Antibody (routine testing w rflx)     Status: None   Collection Time: 09/02/20  1:45 PM  Result Value Ref Range   HIV Screen 4th Generation wRfx Non Reactive Non Reactive    Comment: Performed at [facility], [place] [address] [place], [place] [address]  Resp Panel by RT-PCR (Flu A&B, Covid) Nasopharyngeal Swab     Status: None   Collection Time: 09/02/20  7:03 PM   Specimen: Nasopharyngeal Swab; Nasopharyngeal(NP) swabs in vial transport medium  Result Value Ref Range   SARS Coronavirus 2 by RT PCR NEGATIVE NEGATIVE    Comment: (NOTE) SARS-CoV-2 target nucleic acids are NOT DETECTED.  The SARS-CoV-2 RNA is generally detectable in upper respiratory specimens during the acute phase of infection. The lowest concentration of SARS-CoV-2 viral copies this assay can detect is 138 copies/mL. A negative result does not preclude SARS-Cov-2 infection and should not be used as the sole basis for treatment or other patient management decisions. A negative result may occur with  improper specimen collection/handling, submission of specimen other than nasopharyngeal swab, presence of viral mutation(s) within the areas targeted by this assay, and inadequate number of viral copies(<138 copies/mL). A negative result must be combined with clinical observations, patient history, and epidemiological information. The expected result is Negative.  Fact Sheet for Patients:   EntrepreneurPulse.com.au  Fact Sheet for Healthcare Providers:  IncredibleEmployment.be  This test is no t yet approved or cleared by the [place] FDA and  has been authorized for detection and/or diagnosis of SARS-CoV-2 by FDA under an Emergency Use Authorization (EUA). This EUA will remain  in effect (meaning this test can be used) for the duration of the COVID-19 declaration under Section 564(b)(1) of  the Act, 21 U.S.C.section 360bbb-3(b)(1), unless the authorization is terminated  or revoked sooner.       Influenza A by PCR NEGATIVE NEGATIVE   Influenza B by PCR NEGATIVE NEGATIVE    Comment: (NOTE) The Xpert Xpress SARS-CoV-2/FLU/RSV plus assay is intended as an aid in the diagnosis of influenza from Nasopharyngeal swab specimens and should not be used as a sole basis for treatment. Nasal washings and aspirates are unacceptable for Xpert Xpress SARS-CoV-2/FLU/RSV testing.  Fact Sheet for Patients: EntrepreneurPulse.com.au  Fact Sheet for Healthcare Providers: IncredibleEmployment.be  This test is not yet approved or cleared by the [place] FDA and has been authorized for detection and/or diagnosis of SARS-CoV-2 by FDA under an Emergency Use Authorization (EUA). This EUA will remain in effect (meaning this test can be used) for the duration of the COVID-19 declaration under Section 564(b)(1) of the Act, 21 U.S.C. section 360bbb-3(b)(1), unless the authorization is terminated or revoked.  Performed at [facility], [address] [place], [place] [address]   Comprehensive metabolic panel     Status: Abnormal   Collection Time: 09/03/20  5:17 AM  Result Value Ref Range   Sodium 133 (L) 135 - 145 mmol/L   Potassium 3.6 3.5 - 5.1 mmol/L   Chloride 93 (L) 98 - 111 mmol/L   CO2 28 22 - 32 mmol/L   Glucose, Bld 146 (H) 70 - 99 mg/dL    Comment: Glucose reference range applies only to samples  taken after fasting for at least 8 hours.   BUN 24 (H) 8 - 23 mg/dL   Creatinine, Ser 1.82 (H) 0.61 - 1.24 mg/dL   Calcium 9.3 8.9 - 10.3 mg/dL   Total Protein 8.1 6.5 - 8.1 g/dL   Albumin 3.5 3.5 - 5.0 g/dL   AST 23 15 - 41 U/L   ALT 25 0 - 44 U/L   Alkaline Phosphatase 76 38 - 126 U/L   Total Bilirubin 1.1 0.3 - 1.2 mg/dL   GFR, Estimated 41 (L) >60 mL/min    Comment: (NOTE) Calculated using the CKD-EPI Creatinine Equation (2021)    Anion gap 12 5 - 15    Comment: Performed at [facility], [address], [place], [place] [address]  CBC     Status: None   Collection Time: 09/03/20  5:17 AM  Result Value Ref Range   WBC 8.3 4.0 - 10.5 K/uL   RBC 5.28 4.22 - 5.81 MIL/uL   Hemoglobin 16.1 13.0 - 17.0 g/dL   HCT 47.6 39 - 52 %   MCV 90.2 80.0 - 100.0 fL   MCH 30.5 26.0 - 34.0 pg   MCHC 33.8 30.0 - 36.0 g/dL   RDW 12.2 11.5 - 15.5 %   Platelets 204 150 - 400 K/uL   nRBC 0.0 0.0 - 0.2 %    Comment: Performed at [facility], [address], [place], [place] [address]  Protime-INR     Status: None   Collection Time: 09/03/20  5:17 AM  Result Value Ref Range   Prothrombin Time 13.0 11.4 - 15.2 seconds   INR 1.0 0.8 - 1.2    Comment: (NOTE) INR goal varies based on device and disease states. Performed at [facility], [address] [place], [place] [address]   APTT     Status: None   Collection Time: 09/03/20  5:17 AM  Result Value Ref Range   aPTT 28 24 - 36 seconds    Comment: Performed at [facility], [address], [place], [place] [address]  Magnesium     Status: None   Collection Time: 09/03/20  5:17 AM  Result Value Ref Range   Magnesium 1.8 1.7 - 2.4 mg/dL    Comment: Performed at [facility], [address] [place], [place] [address]  Phosphorus     Status: None   Collection Time: 09/03/20  5:17 AM  Result Value Ref Range   Phosphorus 3.4 2.5 - 4.6 mg/dL    Comment: Performed at [facility], [address], [place], [place] [address]   CT ABDOMEN PELVIS WO  CONTRAST  Result Date: 09/02/2020 CLINICAL DATA:  64 year old male with abdominal pain. EXAM: CT ABDOMEN AND PELVIS WITHOUT CONTRAST TECHNIQUE: Multidetector CT imaging of the abdomen and pelvis was performed following the standard protocol without IV contrast. COMPARISON:  CT dated 01/27/2020. FINDINGS: Evaluation of this exam is limited in the absence of intravenous contrast. Lower chest: There are bibasilar interstitial coarsening and atelectasis. No intra-abdominal free air or free fluid. Hepatobiliary: Cirrhosis. No intrahepatic biliary dilatation. No calcified gallstone or pericholecystic fluid. Pancreas: There is inflammatory changes of the pancreas consistent with acute pancreatitis. Correlation with pancreatic enzymes recommended. No drainable fluid collection/abscess or pseudocyst. Spleen: Normal in size without focal abnormality. Adrenals/Urinary Tract: The adrenal glands unremarkable. The kidneys, visualized ureters, and urinary bladder appear unremarkable. Stomach/Bowel: There is moderate stool throughout the colon. There is no bowel obstruction or active inflammation. The appendix is normal. Vascular/Lymphatic: Mild aortoiliac atherosclerotic disease. The IVC is unremarkable. No portal venous gas. Top-normal retroperitoneal lymph nodes, likely reactive. Reproductive: The prostate and seminal vesicles are grossly unremarkable. No pelvic mass. Other: Small fat containing umbilical hernia. Musculoskeletal: Degenerative changes of the spine. Lumbar posterior fusion. No acute osseous pathology. IMPRESSION: 1. Acute pancreatitis. No abscess or pseudocyst. 2. Cirrhosis. 3. Aortic Atherosclerosis (ICD10-I70.0). Electronically Signed   By: Anner Crete M.D.   On: 09/02/2020 18:16   US Abdomen Limited RUQ (LIVER/GB)  Result Date: 09/03/2020 CLINICAL DATA:  Acute pancreatitis. EXAM: ULTRASOUND ABDOMEN LIMITED RIGHT UPPER QUADRANT COMPARISON:  CT 09/02/2020.  MRI/MRCP 02/11/2020. FINDINGS: Gallbladder:  No gallstones or wall thickening visualized. No sonographic Murphy sign noted by sonographer. Common bile duct: Diameter: 8.9 mm. Similar finding noted on prior MRI/MRCP of 02/11/2028. Liver: Heterogeneous nodular hepatic parenchymal pattern again noted. Cirrhosis cannot be excluded. Portal vein is patent on color Doppler imaging with normal direction of blood flow towards the liver. Other: None. IMPRESSION: 1. No gallstones. 2. Dilated common bile duct at 8.9 mm. Similar finding noted on prior MRI/MRCP of 02/11/2028. No focal obstructing abnormality is identified. 3. Heterogeneous nodular hepatic parenchymal pattern again noted. Cirrhosis cannot be excluded. Electronically Signed   By: Marcello Moores  Register   On: 09/03/2020 07:57    Pending Labs Unresulted Labs (From admission, onward)          Start     Ordered   09/04/20 0500  Comprehensive metabolic panel  Tomorrow morning,   R        09/03/20 1115   09/04/20 0500  CBC  Tomorrow morning,   R        09/03/20 1115          Vitals/Pain Today's Vitals   09/03/20 1255 09/03/20 1300 09/03/20 1330 09/03/20 1342  BP: (!) 164/127 (!) 174/126  (!) 157/103  Pulse: 91 78  93  Resp: (!) 26 11  17   Temp:      TempSrc:      SpO2: 94% 97%  93%  Weight:      Height:      PainSc:   5      Isolation Precautions Airborne precautions  Medications Medications  heparin injection 5,000 Units (5,000 Units Subcutaneous Given 09/03/20 0516)  ondansetron (ZOFRAN) injection 4 mg (4 mg Intravenous Given 09/03/20 1301)  morphine 2 MG/ML injection 2 mg (2 mg Intravenous Given 09/03/20 1302)  pantoprazole (PROTONIX) injection 40 mg (40 mg Intravenous Given 09/02/20 2232)  atorvastatin (LIPITOR) tablet 10 mg (10 mg Oral Given 09/03/20 1043)  lactated ringers infusion ( Intravenous Rate/Dose Change 09/03/20 1116)  hydrALAZINE (APRESOLINE) tablet 10 mg (10 mg Oral Given 09/03/20 1043)  amLODipine (NORVASC) tablet 10 mg (10 mg Oral Given 09/03/20 1140)   morphine 2 MG/ML injection 2 mg (2 mg Intravenous Given 09/02/20 1637)  pantoprazole (PROTONIX) injection 40 mg (40 mg Intravenous Given 09/02/20 1630)  lactated ringers bolus 1,000 mL (0 mLs Intravenous Stopped 09/03/20 0517)  morphine 2 MG/ML injection 2 mg (2 mg Intravenous Given 09/02/20 1914)  lactated ringers infusion ( Intravenous New Bag/Given (Non-Interop) 09/03/20 0515)    Mobility walks Moderate fall risk   Focused Assessments    R Recommendations: See Admitting Provider Note  Report given to:   Additional Notes:

## 2020-09-03 NOTE — Progress Notes (Signed)
Pt continues to have intermittent episodes of PSVT into 170's. MD Netty notified of continued tachycardia and that although Cardiology has been notified of consult, they may not see him due to time of day. MD acknowledges note, no new orders at this time.

## 2020-09-03 NOTE — Progress Notes (Signed)
MD Netty sends note that he has ordered lopressor oral for pt's continued tachycardia. MD notified of pt's current b/p 172/116 s/p hydralazine 25 mg po administration. Pt denies any c/o, resting quietly in bed.

## 2020-09-03 NOTE — Progress Notes (Signed)
PROGRESS NOTE    Joshua Maldonado  BMW:[id number] DOB: 05-28-1956 DOA: 09/02/2020 PCP: Alanson Puls [facility]   Brief Narrative: Joshua Maldonado is a 64 y.o. male with a history of hypertension, hyperlipidemia, cirrhosis secondary hepatitis C.  Patient was admitted secondary to worsening abdominal pain was found to have acute pancreatitis.   Assessment & Plan:   Principal Problem:   Acute pancreatitis Active Problems:   Cirrhosis of liver without ascites (HCC)   Abdominal pain   Essential hypertension   Hyperlipidemia   Acute kidney injury superimposed on CKD (HCC)   Hyperglycemia   Elevated lipase   Acute pancreatitis Patient has CT evidence in addition to an elevated lipase of 292.  Possibly secondary to alcohol use, although mild.  Patient started on clear liquid diet on admission. -We will continue clear liquid diet for now but will de-escalate/advance pending progression of symptoms and analgesic requirements -Restart IV fluids; watch fluid status carefully in setting of cirrhosis history -Oxycodone as needed; if patient is using more IV narcotics will need to de-escalate diet  Abdominal pain Secondary to above.  Pain regimen as mentioned above.  Paroxysmal SVT EKG significant for SVT without p-waves identified. Currently back in sinus rhythm. In setting of above. Patient is asymptomatic at this time. Hemodynamically stable. -Cardiology consult  Cirrhosis of liver without ascites Secondary to hepatitis C.  INR 1.  Normal bilirubin of 0.8.  Mild hyponatremia with sodium of 332.  AKI on CKD stage IIIa In setting of acute pancreatitis.  Patient fluid resuscitated on admission. -Follow-up BMPs -IV fluids as mentioned above  Hyperlipidemia -Continue atorvastatin  Essential hypertension Uncontrolled currently.  Patient is on hydrochlorothiazide and lisinopril as an outpatient. -Continue to hold hydrochlorothiazide and discontinue lisinopril secondary to  AKI -hydralazine 10 mg as needed 4 times daily -amlodipine 10 mg daily   DVT prophylaxis: Heparin subcutaneous Code Status:   Code Status: Full Code Family Communication: None at bedside Disposition Plan: Discharge in 1 to 3 days pending improvement of pancreatitis, improved management of pain   Consultants:   None  Procedures:   None  Antimicrobials:  None   Subjective: Some abdominal pain. No other concerns.  Objective: Vitals:   09/03/20 0515 09/03/20 0600 09/03/20 0615 09/03/20 0630  BP: (!) 133/110 (!) 156/112  (!) 162/143  Pulse: 72  98 97  Resp: 19 12  (!) 22  Temp:      TempSrc:      SpO2: 98% 98%  93%  Weight:      Height:       No intake or output data in the 24 hours ending 09/03/20 0717 Filed Weights   09/02/20 1157  Weight: 104.3 kg    Examination:  General exam: Appears calm and comfortable Respiratory system: Clear to auscultation. Respiratory effort normal. Cardiovascular system: S1 & S2 heard, RRR. No murmurs, rubs, gallops or clicks. Gastrointestinal system: Abdomen is distended, soft and nontender. No organomegaly or masses felt. Normal bowel sounds heard. Central nervous system: Alert and oriented. No focal neurological deficits. Musculoskeletal: No edema. No calf tenderness Skin: No cyanosis. No rashes Psychiatry: Judgement and insight appear normal. Mood & affect appropriate.     Data Reviewed: I have personally reviewed following labs and imaging studies  CBC Lab Results  Component Value Date   WBC 8.3 09/03/2020   RBC 5.28 09/03/2020   HGB 16.1 09/03/2020   HCT 47.6 09/03/2020   MCV 90.2 09/03/2020   MCH 30.5 09/03/2020   PLT 204  09/03/2020   MCHC 33.8 09/03/2020   RDW 12.2 09/03/2020   LYMPHSABS 1.6 08/08/2020   MONOABS 0.6 08/08/2020   EOSABS 0.2 08/08/2020   BASOSABS 0.0 00/92/3300     Last metabolic panel Lab Results  Component Value Date   NA 132 (L) 09/02/2020   K 3.6 09/02/2020   CL 91 (L) 09/02/2020    CO2 29 09/02/2020   BUN 26 (H) 09/02/2020   CREATININE 1.94 (H) 09/02/2020   GLUCOSE 205 (H) 09/02/2020   GFRNONAA 38 (L) 09/02/2020   GFRAA 47 (L) 04/05/2020   CALCIUM 9.3 09/02/2020   PROT 9.0 (H) 09/02/2020   ALBUMIN 3.8 09/02/2020   BILITOT 0.8 09/02/2020   ALKPHOS 87 09/02/2020   AST 31 09/02/2020   ALT 31 09/02/2020   ANIONGAP 12 09/02/2020    CBG (last 3)  No results for input(s): GLUCAP in the last 72 hours.   GFR: Estimated Creatinine Clearance: 46.2 mL/min (A) (by C-G formula based on SCr of 1.94 mg/dL (H)).  Coagulation Profile: No results for input(s): INR, PROTIME in the last 168 hours.  Recent Results (from the past 240 hour(s))  Resp Panel by RT-PCR (Flu A&B, Covid) Nasopharyngeal Swab     Status: None   Collection Time: 09/02/20  7:03 PM   Specimen: Nasopharyngeal Swab; Nasopharyngeal(NP) swabs in vial transport medium  Result Value Ref Range Status   SARS Coronavirus 2 by RT PCR NEGATIVE NEGATIVE Final    Comment: (NOTE) SARS-CoV-2 target nucleic acids are NOT DETECTED.  The SARS-CoV-2 RNA is generally detectable in upper respiratory specimens during the acute phase of infection. The lowest concentration of SARS-CoV-2 viral copies this assay can detect is 138 copies/mL. A negative result does not preclude SARS-Cov-2 infection and should not be used as the sole basis for treatment or other patient management decisions. A negative result may occur with  improper specimen collection/handling, submission of specimen other than nasopharyngeal swab, presence of viral mutation(s) within the areas targeted by this assay, and inadequate number of viral copies(<138 copies/mL). A negative result must be combined with clinical observations, patient history, and epidemiological information. The expected result is Negative.  Fact Sheet for Patients:  EntrepreneurPulse.com.au  Fact Sheet for Healthcare Providers:   IncredibleEmployment.be  This test is no t yet approved or cleared by the [place] FDA and  has been authorized for detection and/or diagnosis of SARS-CoV-2 by FDA under an Emergency Use Authorization (EUA). This EUA will remain  in effect (meaning this test can be used) for the duration of the COVID-19 declaration under Section 564(b)(1) of the Act, 21 U.S.C.section 360bbb-3(b)(1), unless the authorization is terminated  or revoked sooner.       Influenza A by PCR NEGATIVE NEGATIVE Final   Influenza B by PCR NEGATIVE NEGATIVE Final    Comment: (NOTE) The Xpert Xpress SARS-CoV-2/FLU/RSV plus assay is intended as an aid in the diagnosis of influenza from Nasopharyngeal swab specimens and should not be used as a sole basis for treatment. Nasal washings and aspirates are unacceptable for Xpert Xpress SARS-CoV-2/FLU/RSV testing.  Fact Sheet for Patients: EntrepreneurPulse.com.au  Fact Sheet for Healthcare Providers: IncredibleEmployment.be  This test is not yet approved or cleared by the [place] FDA and has been authorized for detection and/or diagnosis of SARS-CoV-2 by FDA under an Emergency Use Authorization (EUA). This EUA will remain in effect (meaning this test can be used) for the duration of the COVID-19 declaration under Section 564(b)(1) of the Act, 21 U.S.C. section 360bbb-3(b)(1),  unless the authorization is terminated or revoked.  Performed at [facility], [address] [place], [place] [address]         Radiology Studies: CT ABDOMEN PELVIS WO CONTRAST  Result Date: 09/02/2020 CLINICAL DATA:  64 year old male with abdominal pain. EXAM: CT ABDOMEN AND PELVIS WITHOUT CONTRAST TECHNIQUE: Multidetector CT imaging of the abdomen and pelvis was performed following the standard protocol without IV contrast. COMPARISON:  CT dated 01/27/2020. FINDINGS: Evaluation of this exam is limited in the absence of  intravenous contrast. Lower chest: There are bibasilar interstitial coarsening and atelectasis. No intra-abdominal free air or free fluid. Hepatobiliary: Cirrhosis. No intrahepatic biliary dilatation. No calcified gallstone or pericholecystic fluid. Pancreas: There is inflammatory changes of the pancreas consistent with acute pancreatitis. Correlation with pancreatic enzymes recommended. No drainable fluid collection/abscess or pseudocyst. Spleen: Normal in size without focal abnormality. Adrenals/Urinary Tract: The adrenal glands unremarkable. The kidneys, visualized ureters, and urinary bladder appear unremarkable. Stomach/Bowel: There is moderate stool throughout the colon. There is no bowel obstruction or active inflammation. The appendix is normal. Vascular/Lymphatic: Mild aortoiliac atherosclerotic disease. The IVC is unremarkable. No portal venous gas. Top-normal retroperitoneal lymph nodes, likely reactive. Reproductive: The prostate and seminal vesicles are grossly unremarkable. No pelvic mass. Other: Small fat containing umbilical hernia. Musculoskeletal: Degenerative changes of the spine. Lumbar posterior fusion. No acute osseous pathology. IMPRESSION: 1. Acute pancreatitis. No abscess or pseudocyst. 2. Cirrhosis. 3. Aortic Atherosclerosis (ICD10-I70.0). Electronically Signed   By: Anner Crete M.D.   On: 09/02/2020 18:16        Scheduled Meds: . atorvastatin  10 mg Oral Daily  . heparin  5,000 Units Subcutaneous Q8H  . lisinopril  20 mg Oral Daily  . pantoprazole (PROTONIX) IV  40 mg Intravenous Q24H   Continuous Infusions:   LOS: 1 day     Cordelia Poche, MD [facility] 09/03/2020, 7:17 AM  If 7PM-7AM, please contact night-coverage [email or web address]

## 2020-09-03 NOTE — Progress Notes (Signed)
MD Nettey called this nurse for pt update. Pt continues with episodes of PSVT into the 170's. Orders received.

## 2020-09-03 NOTE — ED Notes (Signed)
Dr Lonny Prude on unit. EKG reviewed. No new orders at this time.

## 2020-09-03 NOTE — ED Notes (Addendum)
Pt went into SVT for approx 5 min, converted himself back into SR without interventions. EKG sent during SVT and after. Dr Lonny Prude paged. Pt denies pain or feeling of heart racing. Pt resting in bed without complaints at this time.

## 2020-09-04 ENCOUNTER — Encounter (HOSPITAL_COMMUNITY): Payer: Self-pay | Admitting: Internal Medicine

## 2020-09-04 ENCOUNTER — Inpatient Hospital Stay (HOSPITAL_COMMUNITY): Payer: Medicaid Other

## 2020-09-04 DIAGNOSIS — G4733 Obstructive sleep apnea (adult) (pediatric): Secondary | ICD-10-CM | POA: Diagnosis not present

## 2020-09-04 DIAGNOSIS — I10 Essential (primary) hypertension: Secondary | ICD-10-CM | POA: Diagnosis not present

## 2020-09-04 DIAGNOSIS — I471 Supraventricular tachycardia: Secondary | ICD-10-CM | POA: Diagnosis not present

## 2020-09-04 LAB — COMPREHENSIVE METABOLIC PANEL
ALT: 22 U/L (ref 0–44)
AST: 20 U/L (ref 15–41)
Albumin: 3.3 g/dL — ABNORMAL LOW (ref 3.5–5.0)
Alkaline Phosphatase: 67 U/L (ref 38–126)
Anion gap: 9 (ref 5–15)
BUN: 19 mg/dL (ref 8–23)
CO2: 27 mmol/L (ref 22–32)
Calcium: 9.3 mg/dL (ref 8.9–10.3)
Chloride: 96 mmol/L — ABNORMAL LOW (ref 98–111)
Creatinine, Ser: 1.76 mg/dL — ABNORMAL HIGH (ref 0.61–1.24)
GFR, Estimated: 43 mL/min — ABNORMAL LOW (ref >60)
Glucose, Bld: 133 mg/dL — ABNORMAL HIGH (ref 70–99)
Potassium: 3.9 mmol/L (ref 3.5–5.1)
Sodium: 132 mmol/L — ABNORMAL LOW (ref 135–145)
Total Bilirubin: 0.8 mg/dL (ref 0.3–1.2)
Total Protein: 7.8 g/dL (ref 6.5–8.1)

## 2020-09-04 LAB — ECHOCARDIOGRAM COMPLETE
Area-P 1/2: 3.01 cm2
Height: 68 in
S' Lateral: 2.8 cm
Weight: 3537.94 oz

## 2020-09-04 LAB — CBC
HCT: 48.3 % (ref 39.0–52.0)
Hemoglobin: 16 g/dL (ref 13.0–17.0)
MCH: 30 pg (ref 26.0–34.0)
MCHC: 33.1 g/dL (ref 30.0–36.0)
MCV: 90.4 fL (ref 80.0–100.0)
Platelets: 185 10*3/uL (ref 150–400)
RBC: 5.34 MIL/uL (ref 4.22–5.81)
RDW: 12.1 % (ref 11.5–15.5)
WBC: 7.1 10*3/uL (ref 4.0–10.5)
nRBC: 0 % (ref 0.0–0.2)

## 2020-09-04 MED ORDER — METOPROLOL TARTRATE 25 MG PO TABS: 25.0000 mg | ORAL_TABLET | Freq: Two times a day (BID) | ORAL | Status: DC

## 2020-09-04 MED ORDER — LACTATED RINGERS IV SOLN: INTRAVENOUS | Status: DC

## 2020-09-04 MED ORDER — METOPROLOL TARTRATE 50 MG PO TABS
50.0000 mg | ORAL_TABLET | Freq: Two times a day (BID) | ORAL | Status: DC
  Administered 2020-09-04 – 2020-09-07 (×6): 50 mg via ORAL
  Filled 2020-09-04 (×6): qty 1

## 2020-09-04 MED ORDER — METOPROLOL TARTRATE 50 MG PO TABS: 50.0000 mg | ORAL_TABLET | Freq: Two times a day (BID) | ORAL | Status: DC

## 2020-09-04 NOTE — Progress Notes (Signed)
*  PRELIMINARY RESULTS* Echocardiogram 2D Echocardiogram has been performed.  Joshua Maldonado 09/04/2020, 2:45 PM

## 2020-09-04 NOTE — Consult Note (Addendum)
Cardiology Consult    Patient ID: Joshua Maldonado; [id number]; 12/14/1955   Admit date: 09/02/2020 Date of Consult: 09/04/2020  Primary Care Provider: [facility] Primary Cardiologist: New to [facility] - Dr. Domenic Polite  Patient Profile    Joshua Maldonado is a 64 y.o. male with past medical history of HTN, HLD, OSA (not on CPAP) and cirrhosis with history of Hepatitis C who is being seen today for the evaluation of SVT at the request of Dr. Lonny Prude.   History of Present Illness    Joshua Maldonado presented to [facility] ED on 09/02/2020 for evaluation of abdominal pain over the past week. Initial labs showed WBC 8.5, Hgb 16.6, platelets 200, Na+ 132, K+ 3.6, creatinine 1.94 (baseline 1.5 - 1.7), AST 31 and ALT 31. Lipase 292. Mg 1.8. COVID negative. CT Abdomen confirmed acute pancreatitis. Abdominal US showed a dilated common bile duct at 8.9 mm with similar findings noted on prior MRI/MRCP of 02/11/2028 with no focal obstructing abnormality identified. He was admitted for further management and has been started on IV pain medication along with IVF and a clear liquid diet.  Yesterday morning, he was found to have episodes of tachycardia and EKG confirmed SVT. He would self-convert into NSR. Was started on Lopressor 25mg  BID last night. He was on Lisinopril and HCTZ prior to admission but both have been held given his AKI with Amlodipine 10mg  being initiated.   In talking with the patient today, he is unaware of any prior cardiac history. No known history of CAD, CHF or cardiac arrhythmias. No known family history of cardiac issues. He was asymptomatic with his arrhythmia yesterday and denies any associated palpitations, dizziness or presyncope. No recent chest pain. He reports he does have baseline dyspnea on exertion but feels like this is secondary to deconditioning. He previously lived in [place], [place] but moved to [place] last year. States he was previously using a CPAP but did not  bring the machine with him due to feeling like this was not helping with his symptoms.  He does consume 2-3 cups of coffee daily. Reports only social alcohol use and denies any binge drinking.   Past Medical History:  Diagnosis Date  . Cirrhosis ([place])    Per patient, diagnosed 3-4 years ago at [facility].  Reports liver biopsy at [facility].  Suspected to be secondary to hepatitis C.  . Gout   . Hepatitis C    Per patient, s/p treatment with Harvoni at [facility]  . HLD (hyperlipidemia)   . Hypertension   . Sleep apnea     Past Surgical History:  Procedure Laterality Date  . BACK SURGERY    . COLONOSCOPY    . LIVER BIOPSY       Home Medications:  Prior to Admission medications   Medication Sig Start Date End Date Taking? Authorizing Provider  allopurinol (ZYLOPRIM) 100 MG tablet Take 100 mg by mouth daily as needed (gout).  04/05/20  Yes [provider]  atorvastatin (LIPITOR) 10 MG tablet Take 10 mg by mouth daily.   Yes [provider]  cyclobenzaprine (FLEXERIL) 10 MG tablet Take 10 mg by mouth 3 (three) times daily as needed for muscle spasms. 07/29/20  Yes [provider]  DULoxetine (CYMBALTA) 30 MG capsule Take 30 mg by mouth daily. 07/12/20  Yes [provider]  fluticasone (FLONASE) 50 MCG/ACT nasal spray Place 1 spray into both nostrils daily as needed for allergies or rhinitis.  Yes [provider]  gabapentin (NEURONTIN) 600 MG tablet Take 600 mg by mouth 3 (three) times daily.   Yes [provider]  hydrochlorothiazide (HYDRODIURIL) 25 MG tablet Take 25 mg by mouth daily.   Yes [provider]  lisinopril (ZESTRIL) 20 MG tablet Take 20 mg by mouth daily.    Yes [provider]  Multiple Vitamin (MULTIVITAMIN WITH MINERALS) TABS tablet Take 1 tablet by mouth daily.   Yes [provider]  pantoprazole (PROTONIX) 40 MG tablet TAKE 1 TABLET (40 MG TOTAL) BY MOUTH 2 (TWO) TIMES DAILY  BEFORE A MEAL. 08/07/20 08/07/21 Yes Carlis Stable, NP  sucralfate (CARAFATE) 1 g tablet TAKE 1 TABLET (1 G TOTAL) BY MOUTH 4 (FOUR) TIMES DAILY - WITH MEALS AND AT BEDTIME. Patient taking differently: Take 1 g by mouth daily.  05/01/20 05/01/21 Yes Annitta Needs, NP  Vitamin D, Ergocalciferol, (DRISDOL) 1.25 MG (50000 UNIT) CAPS capsule Take 50,000 Units by mouth every 7 (seven) days.    Yes [provider]    Inpatient Medications: Scheduled Meds: . amLODipine  10 mg Oral Daily  . atorvastatin  10 mg Oral Daily  . heparin  5,000 Units Subcutaneous Q8H  . metoprolol tartrate  50 mg Oral BID  . pantoprazole (PROTONIX) IV  40 mg Intravenous Q24H   Continuous Infusions:  PRN Meds: hydrALAZINE, morphine injection, ondansetron (ZOFRAN) IV  Allergies:   No Known Allergies  Social History:   Social History   Socioeconomic History  . Marital status: Single    Spouse name: Not on file  . Number of children: Not on file  . Years of education: Not on file  . Highest education level: Not on file  Occupational History  . Not on file  Tobacco Use  . Smoking status: Current Every Day Smoker    Packs/day: 0.25    Years: 12.00    Pack years: 3.00    Types: Cigarettes  . Smokeless tobacco: Never Used  Vaping Use  . Vaping Use: Never used  Substance and Sexual Activity  . Alcohol use: Yes    Alcohol/week: 5.0 standard drinks    Types: 2 Cans of beer, 3 Shots of liquor per week    Comment: 3 drinks a week  . Drug use: Never  . Sexual activity: Yes  Other Topics Concern  . Not on file  Social History Narrative  . Not on file   Social Determinants of Health   Financial Resource Strain:   . Difficulty of Paying Living Expenses: Not on file  Food Insecurity:   . Worried About Charity fundraiser in the Last Year: Not on file  . Ran Out of Food in the Last Year: Not on file  Transportation Needs:   . Lack of Transportation (Medical): Not on file  . Lack of Transportation  (Non-Medical): Not on file  Physical Activity:   . Days of Exercise per Week: Not on file  . Minutes of Exercise per Session: Not on file  Stress:   . Feeling of Stress : Not on file  Social Connections:   . Frequency of Communication with Friends and Family: Not on file  . Frequency of Social Gatherings with Friends and Family: Not on file  . Attends Religious Services: Not on file  . Active Member of Clubs or Organizations: Not on file  . Attends Archivist Meetings: Not on file  . Marital Status: Not on file  Intimate Partner Violence:   .  Fear of Current or Ex-Partner: Not on file  . Emotionally Abused: Not on file  . Physically Abused: Not on file  . Sexually Abused: Not on file     Family History:    Family History  Problem Relation Age of Onset  . Hypertension Father   . Colon cancer Neg Hx      Review of Systems    General:  No chills, fever, night sweats or weight changes.  Cardiovascular:  No chest pain, dyspnea on exertion, edema, orthopnea, palpitations, paroxysmal nocturnal dyspnea. Dermatological: No rash, lesions/masses Respiratory: No cough, dyspnea Urologic: No hematuria, dysuria Abdominal:   No nausea, vomiting, diarrhea, bright red blood per rectum, melena, or hematemesis. Positive for abdominal pain.  Neurologic:  No visual changes, wkns, changes in mental status. All other systems reviewed and are otherwise negative except as noted above.  Physical Exam/Data    Vitals:   09/03/20 2002 09/04/20 0016 09/04/20 0338 09/04/20 0907  BP: (!) 155/110 (!) 151/111 (!) 155/106 (!) 146/113  Pulse: 78 86 83 87  Resp: 18 18 18 18   Temp: 98.6 F (37 C) 98.2 F (36.8 C) 98.4 F (36.9 C)   TempSrc: Oral Oral Oral   SpO2: 97% 95% 98% 100%  Weight:      Height:        Intake/Output Summary (Last 24 hours) at 09/04/2020 0942 Last data filed at 09/04/2020 0531 Gross per 24 hour  Intake 2315.53 ml  Output 2590 ml  Net -274.47 ml   Filed Weights    09/02/20 1157 09/03/20 1433  Weight: 104.3 kg 100.3 kg   Body mass index is 33.62 kg/m.   General: Pleasant male appearing in NAD Psych: Normal affect. Neuro: Alert and oriented X 3. Moves all extremities spontaneously. HEENT: Normal  Neck: Supple without bruits or JVD. Lungs:  Resp regular and unlabored, CTA without wheezing or rales. Heart: RRR no s3, s4, or murmurs. Abdomen: Soft, non-tender, non-distended, BS + x 4.  Extremities: No clubbing, cyanosis or lower extremity edema. DP/PT/Radials 2+ and equal bilaterally.   EKG:  The EKG was personally reviewed and demonstrates: SVT, HR 169 with TWI along inferior leads.   Telemetry:  Telemetry was personally reviewed and demonstrates: Episodes of SVT around 1400 and 2200 on 11/30 with HR in 150's to 170's. Now in NSR with HR in 70's to 80's.    Labs/Studies     Relevant CV Studies:  None on File.   Laboratory Data:  Chemistry Recent Labs  Lab 09/02/20 1345 09/03/20 0517  NA 132* 133*  K 3.6 3.6  CL 91* 93*  CO2 29 28  GLUCOSE 205* 146*  BUN 26* 24*  CREATININE 1.94* 1.82*  CALCIUM 9.3 9.3  GFRNONAA 38* 41*  ANIONGAP 12 12    Recent Labs  Lab 09/02/20 1345 09/03/20 0517  PROT 9.0* 8.1  ALBUMIN 3.8 3.5  AST 31 23  ALT 31 25  ALKPHOS 87 76  BILITOT 0.8 1.1   Hematology Recent Labs  Lab 09/02/20 1345 09/03/20 0517 09/04/20 0845  WBC 8.5 8.3 7.1  RBC 5.52 5.28 5.34  HGB 16.6 16.1 16.0  HCT 49.6 47.6 48.3  MCV 89.9 90.2 90.4  MCH 30.1 30.5 30.0  MCHC 33.5 33.8 33.1  RDW 12.4 12.2 12.1  PLT 200 204 185   Cardiac EnzymesNo results for input(s): TROPONINI in the last 168 hours. No results for input(s): TROPIPOC in the last 168 hours.  BNPNo results for input(s): BNP, PROBNP in the  last 168 hours.  DDimer No results for input(s): DDIMER in the last 168 hours.  Radiology/Studies:  CT ABDOMEN PELVIS WO CONTRAST  Result Date: 09/02/2020 CLINICAL DATA:  64 year old male with abdominal pain. EXAM: CT  ABDOMEN AND PELVIS WITHOUT CONTRAST TECHNIQUE: Multidetector CT imaging of the abdomen and pelvis was performed following the standard protocol without IV contrast. COMPARISON:  CT dated 01/27/2020. FINDINGS: Evaluation of this exam is limited in the absence of intravenous contrast. Lower chest: There are bibasilar interstitial coarsening and atelectasis. No intra-abdominal free air or free fluid. Hepatobiliary: Cirrhosis. No intrahepatic biliary dilatation. No calcified gallstone or pericholecystic fluid. Pancreas: There is inflammatory changes of the pancreas consistent with acute pancreatitis. Correlation with pancreatic enzymes recommended. No drainable fluid collection/abscess or pseudocyst. Spleen: Normal in size without focal abnormality. Adrenals/Urinary Tract: The adrenal glands unremarkable. The kidneys, visualized ureters, and urinary bladder appear unremarkable. Stomach/Bowel: There is moderate stool throughout the colon. There is no bowel obstruction or active inflammation. The appendix is normal. Vascular/Lymphatic: Mild aortoiliac atherosclerotic disease. The IVC is unremarkable. No portal venous gas. Top-normal retroperitoneal lymph nodes, likely reactive. Reproductive: The prostate and seminal vesicles are grossly unremarkable. No pelvic mass. Other: Small fat containing umbilical hernia. Musculoskeletal: Degenerative changes of the spine. Lumbar posterior fusion. No acute osseous pathology. IMPRESSION: 1. Acute pancreatitis. No abscess or pseudocyst. 2. Cirrhosis. 3. Aortic Atherosclerosis (ICD10-I70.0). Electronically Signed   By: Anner Crete M.D.   On: 09/02/2020 18:16   US Abdomen Limited RUQ (LIVER/GB)  Result Date: 09/03/2020 CLINICAL DATA:  Acute pancreatitis. EXAM: ULTRASOUND ABDOMEN LIMITED RIGHT UPPER QUADRANT COMPARISON:  CT 09/02/2020.  MRI/MRCP 02/11/2020. FINDINGS: Gallbladder: No gallstones or wall thickening visualized. No sonographic Murphy sign noted by sonographer.  Common bile duct: Diameter: 8.9 mm. Similar finding noted on prior MRI/MRCP of 02/11/2028. Liver: Heterogeneous nodular hepatic parenchymal pattern again noted. Cirrhosis cannot be excluded. Portal vein is patent on color Doppler imaging with normal direction of blood flow towards the liver. Other: None. IMPRESSION: 1. No gallstones. 2. Dilated common bile duct at 8.9 mm. Similar finding noted on prior MRI/MRCP of 02/11/2028. No focal obstructing abnormality is identified. 3. Heterogeneous nodular hepatic parenchymal pattern again noted. Cirrhosis cannot be excluded. Electronically Signed   By: Marcello Moores  Register   On: 09/03/2020 07:57   Assessment & Plan    1. SVT - This is a new diagnosis for the patient in the setting of currently being admitted for acute pancreatitis which could be contributing to his arrhythmia. He has been asymptomatic with his episodes. Since being started on beta-blocker therapy, he has only experienced one episode of SVT which spontaneously resolved. - He is currently on Lopressor 25 mg twice daily. Given his ectopy and consistently elevated BP, will further titrate Lopressor to 50 mg twice daily. An echocardiogram has been ordered by the admitting team and is scheduled to be performed later today. Would aim to keep Mg ~ 2.0 and K+ 4.0.   2. HTN - BP has been elevated at 151/111 - 176/115 within the past 24 hours. PTA Lisinopril and HCTZ currently held given AKI. He has been started on Amlodipine 10mg  daily and will titrate Lopressor from 25mg  BID to 50mg  BID. If BP remains elevated, would recommend scheduled Hydralazine in place of PRN doses.   3. OSA - He has not used his CPAP in over 1 year and no longer has his machine. Would refer for a repeat sleep study as an outpatient once recovered from his acute illness. We  did review the role untreated OSA can play in his HTN and arrhythmias.   4. Acute Pancreatitis - Further management per admitting team.    For questions or  updates, please contact [place] Please consult [email or web address] for contact info under Cardiology/STEMI.  Signed, Erma Heritage, PA-C 09/04/2020, 9:42 AM Pager: [phone]   Attending note:  Patient seen and examined.  I reviewed his records and discussed the case with Ms. Delano Metz, I agree with her above findings.  Joshua Maldonado is currently admitted to the hospital with acute pancreatitis.  During his stay he has been noted to have episodes of PSVT by telemetry monitoring, spontaneously converting to sinus rhythm and not overly symptomatic.  On examination he is in no distress, reasonable control of abdominal pain on analgesics.  He is afebrile.  Blood pressure elevated and heart rate currently in the 80s in sinus rhythm.  Cardiac exam reveals RRR without gallop.  Pertinent lab work includes potassium 3.9, creatinine 1.76 down from 1.82, LFTs normal, hemoglobin 16, platelets 185.  I personally reviewed his telemetry and recent tracings.  SVT looks to be potentially an atrial tachycardia, although a reentrant mechanism also possible.  PSVT noted in the setting of acute pancreatitis.  Agree with initiation of beta-blocker, we will increase Lopressor to 50 mg twice daily and continue to observe telemetry.  Check echocardiogram to assess cardiac structure and function.  Satira Sark, M.D., F.A.C.C.

## 2020-09-04 NOTE — Progress Notes (Signed)
PROGRESS NOTE    Joshua Maldonado  GMW:[id number] DOB: 10-Jul-1956 DOA: 09/02/2020 PCP: Alanson Puls [facility]     Brief Narrative:  Joshua Maldonado is a 64 y.o. 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  Abdominal pain was in the epigastric area, it was nonradiating and was rated as 8/10 on pain scale.  Pain was aggravated with movement, it was minimal when he lies still in bed.  This was associated with reduced bowel movement (usually after 3 bowel movements daily, now has 1 BM/day which is harder/smaller than normal).  He has a follow-up with Dr. Gala Romney on 12/28.  Patient denies nausea, vomiting, diarrhea, fever, chills, chest pain, shortness of breath.  Patient states he only drinks alcohol occasionally but endorsed smoking 1 pack of cigarettes per week. In the emergency department, he was hemodynamically stable. Lipase 292. CT abdomen and pelvis without contrast showed acute pancreatitis.  New events last 24 hours / Subjective: Patient continues to have some epigastric abdominal pain.  Denies any nausea or vomiting.  Also admits to chronic abdominal distention that has been ongoing for about a year.  Assessment & Plan:   Principal Problem:   Acute pancreatitis Active Problems:   Cirrhosis of liver without ascites (HCC)   Abdominal pain   Essential hypertension   Hyperlipidemia   Acute kidney injury superimposed on CKD (HCC)   Hyperglycemia   Elevated lipase   Acute pancreatitis -Right upper quadrant ultrasound without gallstones -Continue clear liquid diet -Pain control, antiemetic as needed -IV fluid  Abdominal distention with history of cirrhosis -Has a follow-up with GI at the end of this month -Repeat abdominal ultrasound negative for ascites  CKD stage IIIb -Baseline creatinine  1.5-1.8 -Stable  Essential hypertension -Continue amlodipine, hydralazine as needed  Hyperlipidemia -Continue Lipitor  Paroxysmal SVT -Appreciate cardiology -Continue metoprolol  OSA -Recommend outpatient sleep study.  Patient has not used his CPAP in over a year and does not have his machine anymore   DVT prophylaxis:  heparin injection 5,000 Units Start: 09/02/20 2215 SCDs Start: 09/02/20 2202  Code Status: Full code Family Communication: No family at bedside Disposition Plan:  Status is: Inpatient  Remains inpatient appropriate because:Ongoing diagnostic testing needed not appropriate for outpatient work up   Dispo: The patient is from: Home              Anticipated d/c is to: Home              Anticipated d/c date is: 2 days              Patient currently is not medically stable to d/c.  Remains only on clear liquid diet due to continued pancreatitis.  Ultrasound and echocardiogram ordered   Consultants:   Cardiology  Procedures:   None  Antimicrobials:  Anti-infectives (From admission, onward)   None        Objective: Vitals:   09/04/20 0907 09/04/20 0949 09/04/20 1049 09/04/20 1404  BP: (!) 146/113 (!) 167/116 (!) 149/111 (!) 146/99  Pulse: 87 82 83 78  Resp: 18 18    Temp:    98.4 F (36.9 C)  TempSrc:    Oral  SpO2: 100%   96%  Weight:      Height:        Intake/Output Summary (Last 24 hours) at 09/04/2020 1506 Last data filed at 09/04/2020  7619 Gross per 24 hour  Intake 2315.53 ml  Output 2590 ml  Net -274.47 ml   Filed Weights   09/02/20 1157 09/03/20 1433  Weight: 104.3 kg 100.3 kg    Examination:  General exam: Appears calm and comfortable  Respiratory system: Clear to auscultation. Respiratory effort normal. No respiratory distress. No conversational dyspnea.  Cardiovascular system: S1 & S2 heard, RRR. No murmurs. No pedal edema. Gastrointestinal system: Abdomen is distended, soft and tender to palpation in the epigastrium.  Normal bowel sounds heard. Central nervous system: Alert and oriented. No focal neurological deficits. Speech clear.  Extremities: Symmetric in appearance  Skin: No rashes, lesions or ulcers on exposed skin  Psychiatry: Judgement and insight appear normal. Mood & affect appropriate.   Data Reviewed: I have personally reviewed following labs and imaging studies  CBC: Recent Labs  Lab 09/02/20 1345 09/03/20 0517 09/04/20 0845  WBC 8.5 8.3 7.1  HGB 16.6 16.1 16.0  HCT 49.6 47.6 48.3  MCV 89.9 90.2 90.4  PLT 200 204 509   Basic Metabolic Panel: Recent Labs  Lab 09/02/20 1345 09/03/20 0517 09/04/20 0845  NA 132* 133* 132*  K 3.6 3.6 3.9  CL 91* 93* 96*  CO2 29 28 27   GLUCOSE 205* 146* 133*  BUN 26* 24* 19  CREATININE 1.94* 1.82* 1.76*  CALCIUM 9.3 9.3 9.3  MG  --  1.8  --   PHOS  --  3.4  --    GFR: Estimated Creatinine Clearance: 50 mL/min (A) (by C-G formula based on SCr of 1.76 mg/dL (H)). Liver Function Tests: Recent Labs  Lab 09/02/20 1345 09/03/20 0517 09/04/20 0845  AST 31 23 20   ALT 31 25 22   ALKPHOS 87 76 67  BILITOT 0.8 1.1 0.8  PROT 9.0* 8.1 7.8  ALBUMIN 3.8 3.5 3.3*   Recent Labs  Lab 09/02/20 1345  LIPASE 292*   No results for input(s): AMMONIA in the last 168 hours. Coagulation Profile: Recent Labs  Lab 09/03/20 0517  INR 1.0   Cardiac Enzymes: No results for input(s): CKTOTAL, CKMB, CKMBINDEX, TROPONINI in the last 168 hours. BNP (last 3 results) No results for input(s): PROBNP in the last 8760 hours. HbA1C: No results for input(s): HGBA1C in the last 72 hours. CBG: No results for input(s): GLUCAP in the last 168 hours. Lipid Profile: No results for input(s): CHOL, HDL, LDLCALC, TRIG, CHOLHDL, LDLDIRECT in the last 72 hours. Thyroid Function Tests: No results for input(s): TSH, T4TOTAL, FREET4, T3FREE, THYROIDAB in the last 72 hours. Anemia Panel: No results for input(s): VITAMINB12, FOLATE, FERRITIN, TIBC, IRON, RETICCTPCT in the  last 72 hours. Sepsis Labs: No results for input(s): PROCALCITON, LATICACIDVEN in the last 168 hours.  Recent Results (from the past 240 hour(s))  Resp Panel by RT-PCR (Flu A&B, Covid) Nasopharyngeal Swab     Status: None   Collection Time: 09/02/20  7:03 PM   Specimen: Nasopharyngeal Swab; Nasopharyngeal(NP) swabs in vial transport medium  Result Value Ref Range Status   SARS Coronavirus 2 by RT PCR NEGATIVE NEGATIVE Final    Comment: (NOTE) SARS-CoV-2 target nucleic acids are NOT DETECTED.  The SARS-CoV-2 RNA is generally detectable in upper respiratory specimens during the acute phase of infection. The lowest concentration of SARS-CoV-2 viral copies this assay can detect is 138 copies/mL. A negative result does not preclude SARS-Cov-2 infection and should not be used as the sole basis for treatment or other patient management decisions. A negative result may occur with  improper  specimen collection/handling, submission of specimen other than nasopharyngeal swab, presence of viral mutation(s) within the areas targeted by this assay, and inadequate number of viral copies(<138 copies/mL). A negative result must be combined with clinical observations, patient history, and epidemiological information. The expected result is Negative.  Fact Sheet for Patients:  EntrepreneurPulse.com.au  Fact Sheet for Healthcare Providers:  IncredibleEmployment.be  This test is no t yet approved or cleared by the [place] FDA and  has been authorized for detection and/or diagnosis of SARS-CoV-2 by FDA under an Emergency Use Authorization (EUA). This EUA will remain  in effect (meaning this test can be used) for the duration of the COVID-19 declaration under Section 564(b)(1) of the Act, 21 U.S.C.section 360bbb-3(b)(1), unless the authorization is terminated  or revoked sooner.       Influenza A by PCR NEGATIVE NEGATIVE Final   Influenza B by PCR NEGATIVE  NEGATIVE Final    Comment: (NOTE) The Xpert Xpress SARS-CoV-2/FLU/RSV plus assay is intended as an aid in the diagnosis of influenza from Nasopharyngeal swab specimens and should not be used as a sole basis for treatment. Nasal washings and aspirates are unacceptable for Xpert Xpress SARS-CoV-2/FLU/RSV testing.  Fact Sheet for Patients: EntrepreneurPulse.com.au  Fact Sheet for Healthcare Providers: IncredibleEmployment.be  This test is not yet approved or cleared by the [place] FDA and has been authorized for detection and/or diagnosis of SARS-CoV-2 by FDA under an Emergency Use Authorization (EUA). This EUA will remain in effect (meaning this test can be used) for the duration of the COVID-19 declaration under Section 564(b)(1) of the Act, 21 U.S.C. section 360bbb-3(b)(1), unless the authorization is terminated or revoked.  Performed at [facility], [address] [place],  [address]       Radiology Studies: CT ABDOMEN PELVIS WO CONTRAST  Result Date: 09/02/2020 CLINICAL DATA:  64 year old male with abdominal pain. EXAM: CT ABDOMEN AND PELVIS WITHOUT CONTRAST TECHNIQUE: Multidetector CT imaging of the abdomen and pelvis was performed following the standard protocol without IV contrast. COMPARISON:  CT dated 01/27/2020. FINDINGS: Evaluation of this exam is limited in the absence of intravenous contrast. Lower chest: There are bibasilar interstitial coarsening and atelectasis. No intra-abdominal free air or free fluid. Hepatobiliary: Cirrhosis. No intrahepatic biliary dilatation. No calcified gallstone or pericholecystic fluid. Pancreas: There is inflammatory changes of the pancreas consistent with acute pancreatitis. Correlation with pancreatic enzymes recommended. No drainable fluid collection/abscess or pseudocyst. Spleen: Normal in size without focal abnormality. Adrenals/Urinary Tract: The adrenal glands unremarkable. The kidneys,  visualized ureters, and urinary bladder appear unremarkable. Stomach/Bowel: There is moderate stool throughout the colon. There is no bowel obstruction or active inflammation. The appendix is normal. Vascular/Lymphatic: Mild aortoiliac atherosclerotic disease. The IVC is unremarkable. No portal venous gas. Top-normal retroperitoneal lymph nodes, likely reactive. Reproductive: The prostate and seminal vesicles are grossly unremarkable. No pelvic mass. Other: Small fat containing umbilical hernia. Musculoskeletal: Degenerative changes of the spine. Lumbar posterior fusion. No acute osseous pathology. IMPRESSION: 1. Acute pancreatitis. No abscess or pseudocyst. 2. Cirrhosis. 3. Aortic Atherosclerosis (ICD10-I70.0). Electronically Signed   By: Anner Crete M.D.   On: 09/02/2020 18:16   [place] ASCITES (ABDOMEN LIMITED)  Result Date: 09/04/2020 CLINICAL DATA:  Abdominal distension with concern for ascites EXAM: LIMITED ABDOMEN ULTRASOUND FOR ASCITES TECHNIQUE: Limited ultrasound survey for ascites was performed in all four abdominal quadrants. COMPARISON:  Abdominal ultrasound September 03, 2020; CT abdomen and pelvis September 02, 2020 FINDINGS: There is no evident ascites.  Peristalsing bowel seen. IMPRESSION: No  evident ascites. Electronically Signed   By: Lowella Grip III M.D.   On: 09/04/2020 12:04   US Abdomen Limited RUQ (LIVER/GB)  Result Date: 09/03/2020 CLINICAL DATA:  Acute pancreatitis. EXAM: ULTRASOUND ABDOMEN LIMITED RIGHT UPPER QUADRANT COMPARISON:  CT 09/02/2020.  MRI/MRCP 02/11/2020. FINDINGS: Gallbladder: No gallstones or wall thickening visualized. No sonographic Murphy sign noted by sonographer. Common bile duct: Diameter: 8.9 mm. Similar finding noted on prior MRI/MRCP of 02/11/2028. Liver: Heterogeneous nodular hepatic parenchymal pattern again noted. Cirrhosis cannot be excluded. Portal vein is patent on color Doppler imaging with normal direction of blood flow towards the liver. Other:  None. IMPRESSION: 1. No gallstones. 2. Dilated common bile duct at 8.9 mm. Similar finding noted on prior MRI/MRCP of 02/11/2028. No focal obstructing abnormality is identified. 3. Heterogeneous nodular hepatic parenchymal pattern again noted. Cirrhosis cannot be excluded. Electronically Signed   By: [place]   On: 09/03/2020 07:57      Scheduled Meds: . amLODipine  10 mg Oral Daily  . atorvastatin  10 mg Oral Daily  . heparin  5,000 Units Subcutaneous Q8H  . metoprolol tartrate  50 mg Oral BID  . pantoprazole (PROTONIX) IV  40 mg Intravenous Q24H   Continuous Infusions:   LOS: 2 days      Time spent: 35 minutes   Dessa Phi, DO [facility] 09/04/2020, 3:06 PM   Available via Epic secure chat 7am-7pm After these hours, please refer to coverage provider listed on [email or web address]

## 2020-09-05 ENCOUNTER — Ambulatory Visit (HOSPITAL_COMMUNITY): Payer: Medicaid Other | Admitting: Physical Therapy

## 2020-09-05 DIAGNOSIS — I471 Supraventricular tachycardia: Secondary | ICD-10-CM | POA: Diagnosis not present

## 2020-09-05 LAB — BASIC METABOLIC PANEL
Anion gap: 11 (ref 5–15)
BUN: 17 mg/dL (ref 8–23)
CO2: 23 mmol/L (ref 22–32)
Calcium: 9.4 mg/dL (ref 8.9–10.3)
Chloride: 100 mmol/L (ref 98–111)
Creatinine, Ser: 1.67 mg/dL — ABNORMAL HIGH (ref 0.61–1.24)
GFR, Estimated: 46 mL/min — ABNORMAL LOW (ref >60)
Glucose, Bld: 115 mg/dL — ABNORMAL HIGH (ref 70–99)
Potassium: 4.3 mmol/L (ref 3.5–5.1)
Sodium: 134 mmol/L — ABNORMAL LOW (ref 135–145)

## 2020-09-05 MED ORDER — INFLUENZA VAC SPLIT QUAD 0.5 ML IM SUSY: 0.5000 mL | PREFILLED_SYRINGE | INTRAMUSCULAR | Status: DC

## 2020-09-05 NOTE — Progress Notes (Signed)
PROGRESS NOTE    Joshua Maldonado  ZHY:[id number] DOB: 06-13-1956 DOA: 09/02/2020 PCP: Alanson Puls [facility]     Brief Narrative:  Joshua Maldonado is a 64 y.o. 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  Abdominal pain was in the epigastric area, it was nonradiating and was rated as 8/10 on pain scale.  Pain was aggravated with movement, it was minimal when he lies still in bed.  This was associated with reduced bowel movement (usually after 3 bowel movements daily, now has 1 BM/day which is harder/smaller than normal).  He has a follow-up with Dr. Gala Romney on 12/28.  Patient denies nausea, vomiting, diarrhea, fever, chills, chest pain, shortness of breath.  Patient states he only drinks alcohol occasionally but endorsed smoking 1 pack of cigarettes per week. In the emergency department, he was hemodynamically stable. Lipase 292. CT abdomen and pelvis without contrast showed acute pancreatitis.  New events last 24 hours / Subjective: Continues to have some epigastric abdominal pain, endorses hunger.  No nausea or vomiting  Assessment & Plan:   Principal Problem:   Acute pancreatitis Active Problems:   Cirrhosis of liver without ascites (HCC)   Abdominal pain   Essential hypertension   Hyperlipidemia   Acute kidney injury superimposed on CKD (HCC)   Hyperglycemia   Elevated lipase   Acute pancreatitis -Right upper quadrant ultrasound without gallstones -Advance to full liquid diet today -Pain control, antiemetic as needed -IV fluid  Abdominal distention with history of cirrhosis -Has a follow-up with GI at the end of this month -Repeat abdominal ultrasound negative for ascites  CKD stage IIIb -Baseline creatinine 1.5-1.8 -Stable  Essential hypertension -Continue amlodipine, hydralazine as  needed  Hyperlipidemia -Continue Lipitor  Paroxysmal SVT -Appreciate cardiology -Continue metoprolol 50 mg twice daily.  Follow-up outpatient next month  OSA -Recommend outpatient sleep study.  Patient has not used his CPAP in over a year and does not have his machine anymore   DVT prophylaxis:  heparin injection 5,000 Units Start: 09/02/20 2215 SCDs Start: 09/02/20 2202  Code Status: Full code Family Communication: No family at bedside Disposition Plan:  Status is: Inpatient  Remains inpatient appropriate because:Ongoing active pain requiring inpatient pain management   Dispo: The patient is from: Home              Anticipated d/c is to: Home              Anticipated d/c date is: 2 days              Patient currently is not medically stable to d/c.  Remains on full liquid diet due to continued pancreatitis.  Continue IV fluids until pancreatitis improves   Consultants:   Cardiology  Procedures:   None  Antimicrobials:  Anti-infectives (From admission, onward)   None       Objective: Vitals:   09/04/20 1049 09/04/20 1404 09/04/20 2100 09/05/20 0558  BP: (!) 149/111 (!) 146/99 (!) 141/106 (!) 145/91  Pulse: 83 78 86 80  Resp:   16 18  Temp:  98.4 F (36.9 C) 99.1 F (37.3 C) 98.1 F (36.7 C)  TempSrc:  Oral Oral Oral  SpO2:  96% 96% 98%  Weight:      Height:        Intake/Output Summary (Last 24 hours) at 09/05/2020 1329 Last data filed at 09/05/2020 0600  Gross per 24 hour  Intake 1240 ml  Output 1400 ml  Net -160 ml   Filed Weights   09/02/20 1157 09/03/20 1433  Weight: 104.3 kg 100.3 kg    Examination: General exam: Appears calm and comfortable  Respiratory system: Clear to auscultation. Respiratory effort normal. Cardiovascular system: S1 & S2 heard, RRR. No pedal edema. Gastrointestinal system: Abdomen is distended, soft and tender to palpation epigastrium Central nervous system: Alert and oriented. Non focal exam. Speech clear   Extremities: Symmetric in appearance bilaterally  Skin: No rashes, lesions or ulcers on exposed skin  Psychiatry: Judgement and insight appear stable. Mood & affect appropriate.    Data Reviewed: I have personally reviewed following labs and imaging studies  CBC: Recent Labs  Lab 09/02/20 1345 09/03/20 0517 09/04/20 0845  WBC 8.5 8.3 7.1  HGB 16.6 16.1 16.0  HCT 49.6 47.6 48.3  MCV 89.9 90.2 90.4  PLT 200 204 035   Basic Metabolic Panel: Recent Labs  Lab 09/02/20 1345 09/03/20 0517 09/04/20 0845 09/05/20 0350  NA 132* 133* 132* 134*  K 3.6 3.6 3.9 4.3  CL 91* 93* 96* 100  CO2 29 28 27 23   GLUCOSE 205* 146* 133* 115*  BUN 26* 24* 19 17  CREATININE 1.94* 1.82* 1.76* 1.67*  CALCIUM 9.3 9.3 9.3 9.4  MG  --  1.8  --   --   PHOS  --  3.4  --   --    GFR: Estimated Creatinine Clearance: 52.7 mL/min (A) (by C-G formula based on SCr of 1.67 mg/dL (H)). Liver Function Tests: Recent Labs  Lab 09/02/20 1345 09/03/20 0517 09/04/20 0845  AST 31 23 20   ALT 31 25 22   ALKPHOS 87 76 67  BILITOT 0.8 1.1 0.8  PROT 9.0* 8.1 7.8  ALBUMIN 3.8 3.5 3.3*   Recent Labs  Lab 09/02/20 1345  LIPASE 292*   No results for input(s): AMMONIA in the last 168 hours. Coagulation Profile: Recent Labs  Lab 09/03/20 0517  INR 1.0   Cardiac Enzymes: No results for input(s): CKTOTAL, CKMB, CKMBINDEX, TROPONINI in the last 168 hours. BNP (last 3 results) No results for input(s): PROBNP in the last 8760 hours. HbA1C: No results for input(s): HGBA1C in the last 72 hours. CBG: No results for input(s): GLUCAP in the last 168 hours. Lipid Profile: No results for input(s): CHOL, HDL, LDLCALC, TRIG, CHOLHDL, LDLDIRECT in the last 72 hours. Thyroid Function Tests: No results for input(s): TSH, T4TOTAL, FREET4, T3FREE, THYROIDAB in the last 72 hours. Anemia Panel: No results for input(s): VITAMINB12, FOLATE, FERRITIN, TIBC, IRON, RETICCTPCT in the last 72 hours. Sepsis Labs: No results  for input(s): PROCALCITON, LATICACIDVEN in the last 168 hours.  Recent Results (from the past 240 hour(s))  Resp Panel by RT-PCR (Flu A&B, Covid) Nasopharyngeal Swab     Status: None   Collection Time: 09/02/20  7:03 PM   Specimen: Nasopharyngeal Swab; Nasopharyngeal(NP) swabs in vial transport medium  Result Value Ref Range Status   SARS Coronavirus 2 by RT PCR NEGATIVE NEGATIVE Final    Comment: (NOTE) SARS-CoV-2 target nucleic acids are NOT DETECTED.  The SARS-CoV-2 RNA is generally detectable in upper respiratory specimens during the acute phase of infection. The lowest concentration of SARS-CoV-2 viral copies this assay can detect is 138 copies/mL. A negative result does not preclude SARS-Cov-2 infection and should not be used as the sole basis for treatment or other patient management decisions. A negative result may occur with  improper  specimen collection/handling, submission of specimen other than nasopharyngeal swab, presence of viral mutation(s) within the areas targeted by this assay, and inadequate number of viral copies(<138 copies/mL). A negative result must be combined with clinical observations, patient history, and epidemiological information. The expected result is Negative.  Fact Sheet for Patients:  EntrepreneurPulse.com.au  Fact Sheet for Healthcare Providers:  IncredibleEmployment.be  This test is no t yet approved or cleared by the [place] FDA and  has been authorized for detection and/or diagnosis of SARS-CoV-2 by FDA under an Emergency Use Authorization (EUA). This EUA will remain  in effect (meaning this test can be used) for the duration of the COVID-19 declaration under Section 564(b)(1) of the Act, 21 U.S.C.section 360bbb-3(b)(1), unless the authorization is terminated  or revoked sooner.       Influenza A by PCR NEGATIVE NEGATIVE Final   Influenza B by PCR NEGATIVE NEGATIVE Final    Comment: (NOTE) The  Xpert Xpress SARS-CoV-2/FLU/RSV plus assay is intended as an aid in the diagnosis of influenza from Nasopharyngeal swab specimens and should not be used as a sole basis for treatment. Nasal washings and aspirates are unacceptable for Xpert Xpress SARS-CoV-2/FLU/RSV testing.  Fact Sheet for Patients: EntrepreneurPulse.com.au  Fact Sheet for Healthcare Providers: IncredibleEmployment.be  This test is not yet approved or cleared by the [place] FDA and has been authorized for detection and/or diagnosis of SARS-CoV-2 by FDA under an Emergency Use Authorization (EUA). This EUA will remain in effect (meaning this test can be used) for the duration of the COVID-19 declaration under Section 564(b)(1) of the Act, 21 U.S.C. section 360bbb-3(b)(1), unless the authorization is terminated or revoked.  Performed at [facility], [address] [place], [place] [address]       Radiology Studies: ECHOCARDIOGRAM COMPLETE  Result Date: 09/04/2020    ECHOCARDIOGRAM REPORT   Patient Name:   JUANDIEGO KOLENOVIC Date of Exam: 09/04/2020 Medical Rec #:  [id number]     Height:       68.0 in Accession #:    [id number]    Weight:       221.1 lb Date of Birth:  Feb 16, 1956     BSA:          2.133 m Patient Age:    63 years      BP:           149/99 mmHg Patient Gender: M             HR:           82 bpm. Exam Location:  [facility] Procedure: 2D Echo, Cardiac Doppler and Color Doppler Indications:    Abdominal distension [[id number]]  History:        Patient has no prior history of Echocardiogram examinations.                 Risk Factors:Hypertension and Dyslipidemia. Cirrhosis ([place])                 (From Hx), History of hepatitis C, Cirrhosis of liver without                 ascites.  Sonographer:    Alvino Chapel RCS Referring Phys: [id number] [place]  1. Left ventricular ejection fraction, by estimation, is 60 to 65%. The left ventricle has normal function. The left  ventricle has no regional wall motion abnormalities. There is moderate left ventricular hypertrophy. Left ventricular diastolic parameters are indeterminate.  2. Right ventricular systolic function is  normal. The right ventricular size is normal.  3. The mitral valve is grossly normal. Trivial mitral valve regurgitation.  4. The aortic valve is tricuspid. Aortic valve regurgitation is not visualized.  5. Aortic dilatation noted. There is mild dilatation of the aortic root, measuring 39 mm.  6. The inferior vena cava is normal in size with greater than 50% respiratory variability, suggesting right atrial pressure of 3 mmHg. FINDINGS  Left Ventricle: Left ventricular ejection fraction, by estimation, is 60 to 65%. The left ventricle has normal function. The left ventricle has no regional wall motion abnormalities. The left ventricular internal cavity size was normal in size. There is  moderate left ventricular hypertrophy. Left ventricular diastolic parameters are indeterminate. Right Ventricle: The right ventricular size is normal. No increase in right ventricular wall thickness. Right ventricular systolic function is normal. Left Atrium: Left atrial size was normal in size. Right Atrium: Right atrial size was normal in size. Pericardium: There is no evidence of pericardial effusion. Mitral Valve: The mitral valve is grossly normal. Trivial mitral valve regurgitation. Tricuspid Valve: The tricuspid valve is grossly normal. Tricuspid valve regurgitation is trivial. Aortic Valve: The aortic valve is tricuspid. There is mild aortic valve annular calcification. Aortic valve regurgitation is not visualized. Pulmonic Valve: The pulmonic valve was grossly normal. Pulmonic valve regurgitation is trivial. Aorta: Aortic dilatation noted. There is mild dilatation of the aortic root, measuring 39 mm. Venous: The inferior vena cava is normal in size with greater than 50% respiratory variability, suggesting right atrial pressure  of 3 mmHg. IAS/Shunts: No atrial level shunt detected by color flow Doppler.  LEFT VENTRICLE PLAX 2D LVIDd:         5.10 cm  Diastology LVIDs:         2.80 cm  LV e' medial:    7.18 cm/s LV PW:         1.00 cm  LV E/e' medial:  10.8 LV IVS:        1.30 cm  LV e' lateral:   6.96 cm/s LVOT diam:     2.00 cm  LV E/e' lateral: 11.1 LV SV:         52 LV SV Index:   24 LVOT Area:     3.14 cm  RIGHT VENTRICLE RV S prime:     19.70 cm/s TAPSE (M-mode): 2.1 cm LEFT ATRIUM             Index       RIGHT ATRIUM           Index LA diam:        3.00 cm 1.41 cm/m  RA Area:     18.20 cm LA Vol (A2C):   47.7 ml 22.36 ml/m RA Volume:   52.20 ml  24.47 ml/m LA Vol (A4C):   50.1 ml 23.49 ml/m LA Biplane Vol: 49.3 ml 23.11 ml/m  AORTIC VALVE LVOT Vmax:   106.00 cm/s LVOT Vmean:  71.000 cm/s LVOT VTI:    0.166 m  AORTA Ao Root diam: 3.90 cm MITRAL VALVE MV Area (PHT): 3.01 cm     SHUNTS MV Decel Time: 252 msec     Systemic VTI:  0.17 m MV E velocity: 77.60 cm/s   Systemic Diam: 2.00 cm MV A velocity: 116.00 cm/s MV E/A ratio:  0.67 Rozann Lesches MD Electronically signed by Rozann Lesches MD Signature Date/Time: 09/04/2020/5:06:08 PM    Final    [place] ASCITES (ABDOMEN LIMITED)  Result Date: 09/04/2020 CLINICAL DATA:  Abdominal distension with concern for ascites EXAM: LIMITED ABDOMEN ULTRASOUND FOR ASCITES TECHNIQUE: Limited ultrasound survey for ascites was performed in all four abdominal quadrants. COMPARISON:  Abdominal ultrasound September 03, 2020; CT abdomen and pelvis September 02, 2020 FINDINGS: There is no evident ascites.  Peristalsing bowel seen. IMPRESSION: No evident ascites. Electronically Signed   By: Lowella Grip III M.D.   On: 09/04/2020 12:04      Scheduled Meds: . amLODipine  10 mg Oral Daily  . atorvastatin  10 mg Oral Daily  . heparin  5,000 Units Subcutaneous Q8H  . metoprolol tartrate  50 mg Oral BID  . pantoprazole (PROTONIX) IV  40 mg Intravenous Q24H   Continuous Infusions: . lactated  ringers 75 mL/hr at 09/04/20 2114     LOS: 3 days      Time spent: 25 minutes   Dessa Phi, DO [facility] 09/05/2020, 1:29 PM   Available via Epic secure chat 7am-7pm After these hours, please refer to coverage provider listed on [email or web address]

## 2020-09-05 NOTE — Progress Notes (Addendum)
Progress Note  Patient Name: Joshua Maldonado Date of Encounter: 09/05/2020  Primary Cardiologist: Rozann Lesches, MD   Subjective   No chest pain or palpitations. Breathing at baseline. Anxious to advance his diet. Does report right foot pain overnight which is new.   Inpatient Medications    Scheduled Meds: . amLODipine  10 mg Oral Daily  . atorvastatin  10 mg Oral Daily  . heparin  5,000 Units Subcutaneous Q8H  . metoprolol tartrate  50 mg Oral BID  . pantoprazole (PROTONIX) IV  40 mg Intravenous Q24H   Continuous Infusions: . lactated ringers 75 mL/hr at 09/04/20 2114   PRN Meds: hydrALAZINE, morphine injection, ondansetron (ZOFRAN) IV   Vital Signs    Vitals:   09/04/20 1049 09/04/20 1404 09/04/20 2100 09/05/20 0558  BP: (!) 149/111 (!) 146/99 (!) 141/106 (!) 145/91  Pulse: 83 78 86 80  Resp:   16 18  Temp:  98.4 F (36.9 C) 99.1 F (37.3 C) 98.1 F (36.7 C)  TempSrc:  Oral Oral Oral  SpO2:  96% 96% 98%  Weight:      Height:        Intake/Output Summary (Last 24 hours) at 09/05/2020 0853 Last data filed at 09/05/2020 0600 Gross per 24 hour  Intake 1240 ml  Output 1400 ml  Net -160 ml    Last 3 Weights 09/03/2020 09/02/2020 08/08/2020  Weight (lbs) 221 lb 1.9 oz 230 lb 225 lb  Weight (kg) 100.3 kg 104.327 kg 102.059 kg      Telemetry    NSR, HR in 70's to 80's. No significant arrhythmias.  - Personally Reviewed  ECG    No new tracings.   Physical Exam   General: Well developed, well nourished, male appearing in no acute distress. Head: Normocephalic, atraumatic.  Neck: Supple without bruits, JVD not elevated. Lungs:  Resp regular and unlabored, CTA without wheezing or rales. Heart: RRR, S1, S2, no S3, S4, or murmur; no rub. Abdomen: Soft, non-tender, non-distended with normoactive bowel sounds. No hepatomegaly. No rebound/guarding. No obvious abdominal masses. Extremities: No clubbing, cyanosis, or lower extremity edema. Distal pedal pulses  are 2+ bilaterally. Neuro: Alert and oriented X 3. Moves all extremities spontaneously. Psych: Normal affect.  Labs    Chemistry Recent Labs  Lab 09/02/20 1345 09/02/20 1345 09/03/20 0517 09/04/20 0845 09/05/20 0350  NA 132*   < > 133* 132* 134*  K 3.6   < > 3.6 3.9 4.3  CL 91*   < > 93* 96* 100  CO2 29   < > 28 27 23   GLUCOSE 205*   < > 146* 133* 115*  BUN 26*   < > 24* 19 17  CREATININE 1.94*   < > 1.82* 1.76* 1.67*  CALCIUM 9.3   < > 9.3 9.3 9.4  PROT 9.0*  --  8.1 7.8  --   ALBUMIN 3.8  --  3.5 3.3*  --   AST 31  --  23 20  --   ALT 31  --  25 22  --   ALKPHOS 87  --  76 67  --   BILITOT 0.8  --  1.1 0.8  --   GFRNONAA 38*   < > 41* 43* 46*  ANIONGAP 12   < > 12 9 11    < > = values in this interval not displayed.     Hematology Recent Labs  Lab 09/02/20 1345 09/03/20 0517 09/04/20 0845  WBC 8.5 8.3 7.1  RBC 5.52 5.28 5.34  HGB 16.6 16.1 16.0  HCT 49.6 47.6 48.3  MCV 89.9 90.2 90.4  MCH 30.1 30.5 30.0  MCHC 33.5 33.8 33.1  RDW 12.4 12.2 12.1  PLT 200 204 185    Cardiac EnzymesNo results for input(s): TROPONINI in the last 168 hours. No results for input(s): TROPIPOC in the last 168 hours.   BNPNo results for input(s): BNP, PROBNP in the last 168 hours.   DDimer No results for input(s): DDIMER in the last 168 hours.   Radiology    [place] ASCITES (ABDOMEN LIMITED)  Result Date: 09/04/2020 CLINICAL DATA:  Abdominal distension with concern for ascites EXAM: LIMITED ABDOMEN ULTRASOUND FOR ASCITES TECHNIQUE: Limited ultrasound survey for ascites was performed in all four abdominal quadrants. COMPARISON:  Abdominal ultrasound September 03, 2020; CT abdomen and pelvis September 02, 2020 FINDINGS: There is no evident ascites.  Peristalsing bowel seen. IMPRESSION: No evident ascites. Electronically Signed   By: Lowella Grip III M.D.   On: 09/04/2020 12:04    Cardiac Studies   Echocardiogram: 09/04/2020 IMPRESSIONS    1. Left ventricular ejection fraction,  by estimation, is 60 to 65%. The  left ventricle has normal function. The left ventricle has no regional  wall motion abnormalities. There is moderate left ventricular hypertrophy.  Left ventricular diastolic  parameters are indeterminate.  2. Right ventricular systolic function is normal. The right ventricular  size is normal.  3. The mitral valve is grossly normal. Trivial mitral valve  regurgitation.  4. The aortic valve is tricuspid. Aortic valve regurgitation is not  visualized.  5. Aortic dilatation noted. There is mild dilatation of the aortic root,  measuring 39 mm.  6. The inferior vena cava is normal in size with greater than 50%  respiratory variability, suggesting right atrial pressure of 3 mmHg.   Patient Profile     64 y.o. male w/PMH of HTN, HLD, OSA (not on CPAP) and cirrhosis with history of Hepatitis C who is currently admitted for acute pancreatitis. Cardiology consulted due to episodes of SVT this admission.   Assessment & Plan    1. SVT - This is a new diagnosis for the patient in the setting of currently being admitted for acute pancreatitis which could be contributing to his arrhythmia. Unaware of his arrhythmias and denies any associated symptoms. Echocardiogram reassuring and showed a preserved EF of 60-65% with no regional WMA.  - He has maintained NSR while on BB therapy and HR is well-controlled in the 70's to 80's. Continue with Lopressor 50mg  BID. No further changes recommended at this time.   2. HTN - BP has improved with medication adjustments, at 145/91 on most recent check. He remains on Amlodipine 10mg  daily and Lopressor 50mg  BID. PTA Lisinopril and HCTZ currently held given AKI but creatinine continues to improve, now at 1.67 today.  3. OSA - Reports a history of OSA and CPAP use but does not currently have a machine. He would benefit from a repeat sleep study as an outpatient given his HTN and cardiac arrhythmias.   4. Dilated Aortic Root -  Echocardiogram showed mild dilatation of the aortic root, measuring 39 mm. Will continue to follow as an outpatient.   5. Acute Pancreatitis - Remains on IVF and a clear liquid diet. Per the admitting team. Further management per admitting team.   6. Right Foot Pain - He does report right foot pain which is tender to palpation and resembles prior gout. Will check Uric Acid level.  For questions or updates, please contact [place] Please consult [email or web address] for contact info under Cardiology/STEMI.   Arna Medici , PA-C 8:53 AM 09/05/2020 Pager: [phone]   Attending note:  Patient seen and examined.  Case discussed with Ms. Ahmed Prima PA-C, I agree with her above findings.  Cardiac rhythm remained stable by telemetry, no recurring PSVT since initiation and up titration of Lopressor.  Continue supportive measures for treatment of acute pancreatitis per primary team.  Pertinent lab work includes potassium 4.3, creatinine down to 1.67 from 1.76.  Echocardiogram from yesterday reported normal LVEF at 60 to 65% with moderate LVH, mildly dilated aortic root at 39 mm.  No further testing planned from a cardiac perspective.  Would continue Lopressor 50 mg twice daily.  He can be scheduled for outpatient cardiology follow-up in the next month for review of any potential PSVT symptoms.  We will sign off.  Satira Sark, M.D., F.A.C.C.

## 2020-09-06 LAB — BASIC METABOLIC PANEL
Anion gap: 11 (ref 5–15)
BUN: 18 mg/dL (ref 8–23)
CO2: 24 mmol/L (ref 22–32)
Calcium: 9.5 mg/dL (ref 8.9–10.3)
Chloride: 100 mmol/L (ref 98–111)
Creatinine, Ser: 1.7 mg/dL — ABNORMAL HIGH (ref 0.61–1.24)
GFR, Estimated: 45 mL/min — ABNORMAL LOW (ref >60)
Glucose, Bld: 109 mg/dL — ABNORMAL HIGH (ref 70–99)
Potassium: 4.4 mmol/L (ref 3.5–5.1)
Sodium: 135 mmol/L (ref 135–145)

## 2020-09-06 LAB — CBC
HCT: 46.8 % (ref 39.0–52.0)
Hemoglobin: 15.5 g/dL (ref 13.0–17.0)
MCH: 29.5 pg (ref 26.0–34.0)
MCHC: 33.1 g/dL (ref 30.0–36.0)
MCV: 89.1 fL (ref 80.0–100.0)
Platelets: 176 10*3/uL (ref 150–400)
RBC: 5.25 MIL/uL (ref 4.22–5.81)
RDW: 11.8 % (ref 11.5–15.5)
WBC: 5.5 10*3/uL (ref 4.0–10.5)
nRBC: 0 % (ref 0.0–0.2)

## 2020-09-06 LAB — URIC ACID: Uric Acid, Serum: 8.6 mg/dL (ref 3.7–8.6)

## 2020-09-06 MED ORDER — HYDRALAZINE HCL 10 MG PO TABS
10.0000 mg | ORAL_TABLET | Freq: Three times a day (TID) | ORAL | Status: DC
  Administered 2020-09-06 – 2020-09-07 (×3): 10 mg via ORAL
  Filled 2020-09-06 (×3): qty 1

## 2020-09-06 NOTE — Progress Notes (Signed)
PROGRESS NOTE    Joshua Maldonado  PPJ:[id number] DOB: [birth year] DOA: 09/02/2020 PCP: Alanson Puls [facility]     Brief Narrative:  Joshua Maldonado is a 64 y.o. 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  Abdominal pain was in the epigastric area, it was nonradiating and was rated as 8/10 on pain scale.  Pain was aggravated with movement, it was minimal when he lies still in bed.  This was associated with reduced bowel movement (usually after 3 bowel movements daily, now has 1 BM/day which is harder/smaller than normal).  He has a follow-up with Dr. Gala Romney on 12/28.  Patient denies nausea, vomiting, diarrhea, fever, chills, chest pain, shortness of breath.  Patient states he only drinks alcohol occasionally but endorsed smoking 1 pack of cigarettes per week. In the emergency department, he was hemodynamically stable. Lipase 292. CT abdomen and pelvis without contrast showed acute pancreatitis.  New events last 24 hours / Subjective: Admits that his pain remains at a 6/10, but no nausea, vomiting. Tolerated full liquid diet without any issues.   Assessment & Plan:   Principal Problem:   Acute pancreatitis Active Problems:   Cirrhosis of liver without ascites (HCC)   Abdominal pain   Essential hypertension   Hyperlipidemia   Acute kidney injury superimposed on CKD (HCC)   Hyperglycemia   Elevated lipase   Acute pancreatitis -Right upper quadrant ultrasound without gallstones -Advance to soft diet today -Pain control, antiemetic as needed  Abdominal distention with history of cirrhosis -Has a follow-up with GI at the end of this month -Repeat abdominal ultrasound negative for ascites  CKD stage IIIb -Baseline creatinine 1.5-1.8 -Stable  Essential hypertension -Continue amlodipine, hydralazine and IV hydralazine as  needed  Hyperlipidemia -Continue Lipitor  Paroxysmal SVT -Appreciate cardiology -Continue metoprolol 50 mg twice daily.  Follow-up outpatient next month  OSA -Recommend outpatient sleep study.  Patient has not used his CPAP in over a year and does not have his machine anymore   DVT prophylaxis:  heparin injection 5,000 Units Start: 09/02/20 2215 SCDs Start: 09/02/20 2202  Code Status: Full code Family Communication: No family at bedside Disposition Plan:  Status is: Inpatient  Remains inpatient appropriate because:Ongoing active pain requiring inpatient pain management   Dispo: The patient is from: Home              Anticipated d/c is to: Home              Anticipated d/c date is: 1 day              Patient currently is not medically stable to d/c.  Advance to soft diet, continues to have pain but tolerating diet and abdominal pain on exam has improved. Hopeful discharge home 12/4 if symptoms better.    Consultants:   Cardiology  Procedures:   None  Antimicrobials:  Anti-infectives (From admission, onward)   None       Objective: Vitals:   09/05/20 1730 09/05/20 2009 09/05/20 2100 09/06/20 0605  BP: (!) 141/94  (!) 131/97 (!) 126/109  Pulse: 72  79 75  Resp: 18  17 16   Temp:   98.5 F (36.9 C) 98.6 F (37 C)  TempSrc:   Oral Oral  SpO2: 99% 93% 98% 98%  Weight:      Height:        Intake/Output Summary (  Last 24 hours) at 09/06/2020 1126 Last data filed at 09/06/2020 0900 Gross per 24 hour  Intake 1480 ml  Output 1250 ml  Net 230 ml   Filed Weights   09/02/20 1157 09/03/20 1433  Weight: 104.3 kg 100.3 kg    Examination: General exam: Appears calm and comfortable  Respiratory system: Clear to auscultation. Respiratory effort normal. Cardiovascular system: S1 & S2 heard, RRR. No pedal edema. Gastrointestinal system: Abdomen is distended, soft and nontender to palpation. Normal bowel sounds heard. Central nervous system: Alert and oriented. Non  focal exam. Speech clear  Extremities: Symmetric in appearance bilaterally  Skin: No rashes, lesions or ulcers on exposed skin  Psychiatry: Judgement and insight appear stable. Mood & affect appropriate.    Data Reviewed: I have personally reviewed following labs and imaging studies  CBC: Recent Labs  Lab 09/02/20 1345 09/03/20 0517 09/04/20 0845 09/06/20 0430  WBC 8.5 8.3 7.1 5.5  HGB 16.6 16.1 16.0 15.5  HCT 49.6 47.6 48.3 46.8  MCV 89.9 90.2 90.4 89.1  PLT 200 204 185 299   Basic Metabolic Panel: Recent Labs  Lab 09/02/20 1345 09/03/20 0517 09/04/20 0845 09/05/20 0350 09/06/20 0430  NA 132* 133* 132* 134* 135  K 3.6 3.6 3.9 4.3 4.4  CL 91* 93* 96* 100 100  CO2 29 28 27 23 24   GLUCOSE 205* 146* 133* 115* 109*  BUN 26* 24* 19 17 18   CREATININE 1.94* 1.82* 1.76* 1.67* 1.70*  CALCIUM 9.3 9.3 9.3 9.4 9.5  MG  --  1.8  --   --   --   PHOS  --  3.4  --   --   --    GFR: Estimated Creatinine Clearance: 51.7 mL/min (A) (by C-G formula based on SCr of 1.7 mg/dL (H)). Liver Function Tests: Recent Labs  Lab 09/02/20 1345 09/03/20 0517 09/04/20 0845  AST 31 23 20   ALT 31 25 22   ALKPHOS 87 76 67  BILITOT 0.8 1.1 0.8  PROT 9.0* 8.1 7.8  ALBUMIN 3.8 3.5 3.3*   Recent Labs  Lab 09/02/20 1345  LIPASE 292*   No results for input(s): AMMONIA in the last 168 hours. Coagulation Profile: Recent Labs  Lab 09/03/20 0517  INR 1.0   Cardiac Enzymes: No results for input(s): CKTOTAL, CKMB, CKMBINDEX, TROPONINI in the last 168 hours. BNP (last 3 results) No results for input(s): PROBNP in the last 8760 hours. HbA1C: No results for input(s): HGBA1C in the last 72 hours. CBG: No results for input(s): GLUCAP in the last 168 hours. Lipid Profile: No results for input(s): CHOL, HDL, LDLCALC, TRIG, CHOLHDL, LDLDIRECT in the last 72 hours. Thyroid Function Tests: No results for input(s): TSH, T4TOTAL, FREET4, T3FREE, THYROIDAB in the last 72 hours. Anemia Panel: No  results for input(s): VITAMINB12, FOLATE, FERRITIN, TIBC, IRON, RETICCTPCT in the last 72 hours. Sepsis Labs: No results for input(s): PROCALCITON, LATICACIDVEN in the last 168 hours.  Recent Results (from the past 240 hour(s))  Resp Panel by RT-PCR (Flu A&B, Covid) Nasopharyngeal Swab     Status: None   Collection Time: 09/02/20  7:03 PM   Specimen: Nasopharyngeal Swab; Nasopharyngeal(NP) swabs in vial transport medium  Result Value Ref Range Status   SARS Coronavirus 2 by RT PCR NEGATIVE NEGATIVE Final    Comment: (NOTE) SARS-CoV-2 target nucleic acids are NOT DETECTED.  The SARS-CoV-2 RNA is generally detectable in upper respiratory specimens during the acute phase of infection. The lowest concentration of SARS-CoV-2 viral copies this  assay can detect is 138 copies/mL. A negative result does not preclude SARS-Cov-2 infection and should not be used as the sole basis for treatment or other patient management decisions. A negative result may occur with  improper specimen collection/handling, submission of specimen other than nasopharyngeal swab, presence of viral mutation(s) within the areas targeted by this assay, and inadequate number of viral copies(<138 copies/mL). A negative result must be combined with clinical observations, patient history, and epidemiological information. The expected result is Negative.  Fact Sheet for Patients:  EntrepreneurPulse.com.au  Fact Sheet for Healthcare Providers:  IncredibleEmployment.be  This test is no t yet approved or cleared by the [place] FDA and  has been authorized for detection and/or diagnosis of SARS-CoV-2 by FDA under an Emergency Use Authorization (EUA). This EUA will remain  in effect (meaning this test can be used) for the duration of the COVID-19 declaration under Section 564(b)(1) of the Act, 21 U.S.C.section 360bbb-3(b)(1), unless the authorization is terminated  or revoked sooner.         Influenza A by PCR NEGATIVE NEGATIVE Final   Influenza B by PCR NEGATIVE NEGATIVE Final    Comment: (NOTE) The Xpert Xpress SARS-CoV-2/FLU/RSV plus assay is intended as an aid in the diagnosis of influenza from Nasopharyngeal swab specimens and should not be used as a sole basis for treatment. Nasal washings and aspirates are unacceptable for Xpert Xpress SARS-CoV-2/FLU/RSV testing.  Fact Sheet for Patients: EntrepreneurPulse.com.au  Fact Sheet for Healthcare Providers: IncredibleEmployment.be  This test is not yet approved or cleared by the [place] FDA and has been authorized for detection and/or diagnosis of SARS-CoV-2 by FDA under an Emergency Use Authorization (EUA). This EUA will remain in effect (meaning this test can be used) for the duration of the COVID-19 declaration under Section 564(b)(1) of the Act, 21 U.S.C. section 360bbb-3(b)(1), unless the authorization is terminated or revoked.  Performed at [facility], [address] [place], [place] [address]       Radiology Studies: ECHOCARDIOGRAM COMPLETE  Result Date: 09/04/2020    ECHOCARDIOGRAM REPORT   Patient Name:   BILLYJOE GO Date of Exam: 09/04/2020 Medical Rec #:  [id number]     Height:       68.0 in Accession #:    [id number]    Weight:       221.1 lb Date of Birth:  12-18-1955     BSA:          2.133 m Patient Age:    41 years      BP:           149/99 mmHg Patient Gender: M             HR:           82 bpm. Exam Location:  [facility] Procedure: 2D Echo, Cardiac Doppler and Color Doppler Indications:    Abdominal distension [[id number]]  History:        Patient has no prior history of Echocardiogram examinations.                 Risk Factors:Hypertension and Dyslipidemia. Cirrhosis ([place])                 (From Hx), History of hepatitis C, Cirrhosis of liver without                 ascites.  Sonographer:    Alvino Chapel RCS Referring Phys: [id number] [place]  1. Left ventricular ejection fraction, by  estimation, is 60 to 65%. The left ventricle has normal function. The left ventricle has no regional wall motion abnormalities. There is moderate left ventricular hypertrophy. Left ventricular diastolic parameters are indeterminate.  2. Right ventricular systolic function is normal. The right ventricular size is normal.  3. The mitral valve is grossly normal. Trivial mitral valve regurgitation.  4. The aortic valve is tricuspid. Aortic valve regurgitation is not visualized.  5. Aortic dilatation noted. There is mild dilatation of the aortic root, measuring 39 mm.  6. The inferior vena cava is normal in size with greater than 50% respiratory variability, suggesting right atrial pressure of 3 mmHg. FINDINGS  Left Ventricle: Left ventricular ejection fraction, by estimation, is 60 to 65%. The left ventricle has normal function. The left ventricle has no regional wall motion abnormalities. The left ventricular internal cavity size was normal in size. There is  moderate left ventricular hypertrophy. Left ventricular diastolic parameters are indeterminate. Right Ventricle: The right ventricular size is normal. No increase in right ventricular wall thickness. Right ventricular systolic function is normal. Left Atrium: Left atrial size was normal in size. Right Atrium: Right atrial size was normal in size. Pericardium: There is no evidence of pericardial effusion. Mitral Valve: The mitral valve is grossly normal. Trivial mitral valve regurgitation. Tricuspid Valve: The tricuspid valve is grossly normal. Tricuspid valve regurgitation is trivial. Aortic Valve: The aortic valve is tricuspid. There is mild aortic valve annular calcification. Aortic valve regurgitation is not visualized. Pulmonic Valve: The pulmonic valve was grossly normal. Pulmonic valve regurgitation is trivial. Aorta: Aortic dilatation noted. There is mild dilatation of the aortic root, measuring 39 mm.  Venous: The inferior vena cava is normal in size with greater than 50% respiratory variability, suggesting right atrial pressure of 3 mmHg. IAS/Shunts: No atrial level shunt detected by color flow Doppler.  LEFT VENTRICLE PLAX 2D LVIDd:         5.10 cm  Diastology LVIDs:         2.80 cm  LV e' medial:    7.18 cm/s LV PW:         1.00 cm  LV E/e' medial:  10.8 LV IVS:        1.30 cm  LV e' lateral:   6.96 cm/s LVOT diam:     2.00 cm  LV E/e' lateral: 11.1 LV SV:         52 LV SV Index:   24 LVOT Area:     3.14 cm  RIGHT VENTRICLE RV S prime:     19.70 cm/s TAPSE (M-mode): 2.1 cm LEFT ATRIUM             Index       RIGHT ATRIUM           Index LA diam:        3.00 cm 1.41 cm/m  RA Area:     18.20 cm LA Vol (A2C):   47.7 ml 22.36 ml/m RA Volume:   52.20 ml  24.47 ml/m LA Vol (A4C):   50.1 ml 23.49 ml/m LA Biplane Vol: 49.3 ml 23.11 ml/m  AORTIC VALVE LVOT Vmax:   106.00 cm/s LVOT Vmean:  71.000 cm/s LVOT VTI:    0.166 m  AORTA Ao Root diam: 3.90 cm MITRAL VALVE MV Area (PHT): 3.01 cm     SHUNTS MV Decel Time: 252 msec     Systemic VTI:  0.17 m MV E velocity: 77.60 cm/s   Systemic Diam: 2.00 cm MV  A velocity: 116.00 cm/s MV E/A ratio:  0.67 Rozann Lesches MD Electronically signed by Rozann Lesches MD Signature Date/Time: 09/04/2020/5:06:08 PM    Final    [place] ASCITES (ABDOMEN LIMITED)  Result Date: 09/04/2020 CLINICAL DATA:  Abdominal distension with concern for ascites EXAM: LIMITED ABDOMEN ULTRASOUND FOR ASCITES TECHNIQUE: Limited ultrasound survey for ascites was performed in all four abdominal quadrants. COMPARISON:  Abdominal ultrasound September 03, 2020; CT abdomen and pelvis September 02, 2020 FINDINGS: There is no evident ascites.  Peristalsing bowel seen. IMPRESSION: No evident ascites. Electronically Signed   By: Lowella Grip III M.D.   On: 09/04/2020 12:04      Scheduled Meds: . amLODipine  10 mg Oral Daily  . atorvastatin  10 mg Oral Daily  . heparin  5,000 Units Subcutaneous Q8H  .  hydrALAZINE  10 mg Oral Q8H  . influenza vac split quadrivalent PF  0.5 mL Intramuscular Tomorrow-1000  . metoprolol tartrate  50 mg Oral BID  . pantoprazole (PROTONIX) IV  40 mg Intravenous Q24H   Continuous Infusions:    LOS: 4 days      Time spent: 25 minutes   Dessa Phi, DO [facility] 09/06/2020, 11:26 AM   Available via Epic secure chat 7am-7pm After these hours, please refer to coverage provider listed on [email or web address]

## 2020-09-06 NOTE — Progress Notes (Signed)
Pt requested pain medications and, after receiving morphine, requested to go downstairs to see wife again. Pt was told that it was probably not a good idea as it was unsafe since he just received a narcotic but pt insisted and seemed to be intent on going. Took patient in wheelchair and remained with patient the duration of the visit. Pt educated  Not to leave unit again when pt asked to be taken "downstairs" to see the rest of his family. Pt back in bed with visitor in room. Verbal understanding of request not to leave unit.

## 2020-09-06 NOTE — Care Management (Signed)
Pt off unit visiting family and his deceased wife/ex-wife on ICU.  Pt medically stable prior to leaving with no immediate needs.

## 2020-09-07 LAB — BASIC METABOLIC PANEL
Anion gap: 10 (ref 5–15)
BUN: 24 mg/dL — ABNORMAL HIGH (ref 8–23)
CO2: 23 mmol/L (ref 22–32)
Calcium: 9.6 mg/dL (ref 8.9–10.3)
Chloride: 101 mmol/L (ref 98–111)
Creatinine, Ser: 1.76 mg/dL — ABNORMAL HIGH (ref 0.61–1.24)
GFR, Estimated: 43 mL/min — ABNORMAL LOW (ref >60)
Glucose, Bld: 140 mg/dL — ABNORMAL HIGH (ref 70–99)
Potassium: 4.3 mmol/L (ref 3.5–5.1)
Sodium: 134 mmol/L — ABNORMAL LOW (ref 135–145)

## 2020-09-07 MED ORDER — METOPROLOL TARTRATE 50 MG PO TABS
50.0000 mg | ORAL_TABLET | Freq: Two times a day (BID) | ORAL | 2 refills | Status: DC
Start: 2020-09-07 — End: 2021-05-26

## 2020-09-07 MED ORDER — SUCRALFATE 1 G PO TABS: 1.0000 g | ORAL_TABLET | Freq: Every day | ORAL | Status: DC

## 2020-09-07 MED ORDER — GABAPENTIN 600 MG PO TABS
600.0000 mg | ORAL_TABLET | Freq: Two times a day (BID) | ORAL | Status: DC | PRN
Start: 2020-09-07 — End: 2024-06-09

## 2020-09-07 MED ORDER — HYDRALAZINE HCL 10 MG PO TABS
10.0000 mg | ORAL_TABLET | Freq: Three times a day (TID) | ORAL | 2 refills | Status: DC
Start: 2020-09-07 — End: 2024-06-09

## 2020-09-07 MED ORDER — AMLODIPINE BESYLATE 10 MG PO TABS
10.0000 mg | ORAL_TABLET | Freq: Every day | ORAL | 2 refills | Status: DC
Start: 2020-09-07 — End: 2024-06-09

## 2020-09-07 NOTE — Progress Notes (Signed)
IV removed and discharge instructions reviewed.  Daughter to give ride home.

## 2020-09-07 NOTE — Discharge Summary (Signed)
Physician Discharge Summary  Joshua Maldonado:[id number] DOB: [birth year] DOA: 09/02/2020  PCP: Alanson Puls [facility] [place] date: 09/02/2020 Discharge date: 09/07/2020  Admitted From:  Home  Disposition:  Home   Recommendations for Outpatient Follow-up:  1. Follow up with PCP in 1 weeks 2. Please make arrangements for outpatient sleep study 3. Follow up with cardiology as scheduled below 4. Follow up with GI in 1 month.   Discharge Condition: STABLE   CODE STATUS: FULL    Brief Hospitalization Summary: Please see all hospital notes, images, labs for full details of the hospitalization. ADMISSION HPI: Joshua Maldonado is a 64 y.o. male with medical history significant for hypertension, hyperlipidemia, cirrhosis secondary to hepatitis C who presents to the emergency department due to 5-day onset of progressive abdominal pain.  Abdominal pain was in the epigastric area, it was nonradiating and was rated as 8/10 on pain scale.  Pain was aggravated with movement, it was minimal when he lies still in bed.  This was associated with reduced bowel movement (usually after 3 bowel movements daily, now has 1 BM/day which is harder/smaller than normal).  He has a follow-up with Dr. Gala Romney on 12/28.  Patient denies nausea, vomiting, diarrhea, fever, chills, chest pain, shortness of breath.  Patient states he only drinks alcohol occasionally but endorsed smoking 1 pack of cigarettes per week.  ED Course:  In the emergency department, he was hemodynamically stable.  BP was 152/115. Work-up in the ED showed normal CBC, hyponatremia, hyperglycemia, BUN to creatinine 36/1.94 (baseline creatinine at 1.5-1.8).  Lipase 292. CT abdomen and pelvis without contrast showed acute pancreatitis and cirrhosis Patient was provided with IV hydration, IV Protonix 40 mg was given and IV morphine 2 mg x 2 was given.  Hospitalist was asked to admit patient for further evaluation and management.  Acute pancreatitis -Right  upper quadrant ultrasound without gallstones -Pt has tolerated soft diet well.  Abdominal distention with history of cirrhosis -Has a follow-up with GI at the end of this month -Repeat abdominal ultrasound negative for ascites  CKD stage IIIb -Baseline creatinine 1.5-1.8 -Stable -lisinopril was discontinued   Essential hypertension -Continue amlodipine, metoprolol, hydralazine -Follow up with PCP  Hyperlipidemia -Continue Lipitor  Paroxysmal SVT -Appreciate cardiology -Continue metoprolol 50 mg twice daily.  Follow-up with cardiology as scheduled outpatient  OSA -Recommend outpatient sleep study.  Patient has not used his CPAP in over a year and does not have his machine anymore   Gout - resume home allopurinol, HCTZ has been discontinued  DVT prophylaxis:  heparin injection 5,000 Units Start: 09/02/20 2215 SCDs Start: 09/02/20 2202  Code Status: Full code Family Communication: No family at bedside Disposition Plan: Home  Status is: Inpatient  Discharge Diagnoses:  Principal Problem:   Acute pancreatitis Active Problems:   Cirrhosis of liver without ascites (HCC)   Abdominal pain   Essential hypertension   Hyperlipidemia   Acute kidney injury superimposed on CKD ([place])   Hyperglycemia   Elevated lipase   Discharge Instructions:  Allergies as of 09/07/2020   No Known Allergies     Medication List    STOP taking these medications   hydrochlorothiazide 25 MG tablet Commonly known as: HYDRODIURIL   lisinopril 20 MG tablet Commonly known as: ZESTRIL     TAKE these medications   allopurinol 100 MG tablet Commonly known as: ZYLOPRIM Take 100 mg by mouth daily as needed (gout).   amLODipine 10 MG tablet Commonly known as: NORVASC Take 1 tablet (10  mg total) by mouth daily.   atorvastatin 10 MG tablet Commonly known as: LIPITOR Take 10 mg by mouth daily.   cyclobenzaprine 10 MG tablet Commonly known as: FLEXERIL Take 10 mg by mouth 3  (three) times daily as needed for muscle spasms.   DULoxetine 30 MG capsule Commonly known as: CYMBALTA Take 30 mg by mouth daily.   fluticasone 50 MCG/ACT nasal spray Commonly known as: FLONASE Place 1 spray into both nostrils daily as needed for allergies or rhinitis.   gabapentin 600 MG tablet Commonly known as: NEURONTIN Take 1 tablet (600 mg total) by mouth 2 (two) times daily as needed. What changed:   when to take this  reasons to take this   hydrALAZINE 10 MG tablet Commonly known as: APRESOLINE Take 1 tablet (10 mg total) by mouth every 8 (eight) hours.   metoprolol tartrate 50 MG tablet Commonly known as: LOPRESSOR Take 1 tablet (50 mg total) by mouth 2 (two) times daily.   multivitamin with minerals Tabs tablet Take 1 tablet by mouth daily.   pantoprazole 40 MG tablet Commonly known as: PROTONIX TAKE 1 TABLET (40 MG TOTAL) BY MOUTH 2 (TWO) TIMES DAILY BEFORE A MEAL.   sucralfate 1 g tablet Commonly known as: CARAFATE Take 1 tablet (1 g total) by mouth daily.   Vitamin D (Ergocalciferol) 1.25 MG (50000 UNIT) Caps capsule Commonly known as: DRISDOL Take 50,000 Units by mouth every 7 (seven) days.       Follow-up Information    Erma Heritage, PA-C Follow up on 09/30/2020.   Specialties: Physician Assistant, Cardiology Why: Cardiology Hospital Follow-up on 09/30/2020 at 2:30 PM. Please arrive by 2:15 PM.  Contact information: [place] [address] [phone]        Pllc, [facility]. Schedule an appointment as soon as possible for a visit in 1 week(s).   Contact information: [address] [place] [address] [phone]        Daneil Dolin, MD. Schedule an appointment as soon as possible for a visit in 2 week(s).   Specialty: Gastroenterology Contact information: [address] [place] [address] [phone]              No Known Allergies Allergies as of 09/07/2020   No Known Allergies      Medication List    STOP taking these medications   hydrochlorothiazide 25 MG tablet Commonly known as: HYDRODIURIL   lisinopril 20 MG tablet Commonly known as: ZESTRIL     TAKE these medications   allopurinol 100 MG tablet Commonly known as: ZYLOPRIM Take 100 mg by mouth daily as needed (gout).   amLODipine 10 MG tablet Commonly known as: NORVASC Take 1 tablet (10 mg total) by mouth daily.   atorvastatin 10 MG tablet Commonly known as: LIPITOR Take 10 mg by mouth daily.   cyclobenzaprine 10 MG tablet Commonly known as: FLEXERIL Take 10 mg by mouth 3 (three) times daily as needed for muscle spasms.   DULoxetine 30 MG capsule Commonly known as: CYMBALTA Take 30 mg by mouth daily.   fluticasone 50 MCG/ACT nasal spray Commonly known as: FLONASE Place 1 spray into both nostrils daily as needed for allergies or rhinitis.   gabapentin 600 MG tablet Commonly known as: NEURONTIN Take 1 tablet (600 mg total) by mouth 2 (two) times daily as needed. What changed:   when to take this  reasons to take this   hydrALAZINE 10 MG tablet Commonly  known as: APRESOLINE Take 1 tablet (10 mg total) by mouth every 8 (eight) hours.   metoprolol tartrate 50 MG tablet Commonly known as: LOPRESSOR Take 1 tablet (50 mg total) by mouth 2 (two) times daily.   multivitamin with minerals Tabs tablet Take 1 tablet by mouth daily.   pantoprazole 40 MG tablet Commonly known as: PROTONIX TAKE 1 TABLET (40 MG TOTAL) BY MOUTH 2 (TWO) TIMES DAILY BEFORE A MEAL.   sucralfate 1 g tablet Commonly known as: CARAFATE Take 1 tablet (1 g total) by mouth daily.   Vitamin D (Ergocalciferol) 1.25 MG (50000 UNIT) Caps capsule Commonly known as: DRISDOL Take 50,000 Units by mouth every 7 (seven) days.       Procedures/Studies: CT ABDOMEN PELVIS WO CONTRAST  Result Date: 09/02/2020 CLINICAL DATA:  64 year old male with abdominal pain. EXAM: CT ABDOMEN AND PELVIS WITHOUT CONTRAST TECHNIQUE:  Multidetector CT imaging of the abdomen and pelvis was performed following the standard protocol without IV contrast. COMPARISON:  CT dated 01/27/2020. FINDINGS: Evaluation of this exam is limited in the absence of intravenous contrast. Lower chest: There are bibasilar interstitial coarsening and atelectasis. No intra-abdominal free air or free fluid. Hepatobiliary: Cirrhosis. No intrahepatic biliary dilatation. No calcified gallstone or pericholecystic fluid. Pancreas: There is inflammatory changes of the pancreas consistent with acute pancreatitis. Correlation with pancreatic enzymes recommended. No drainable fluid collection/abscess or pseudocyst. Spleen: Normal in size without focal abnormality. Adrenals/Urinary Tract: The adrenal glands unremarkable. The kidneys, visualized ureters, and urinary bladder appear unremarkable. Stomach/Bowel: There is moderate stool throughout the colon. There is no bowel obstruction or active inflammation. The appendix is normal. Vascular/Lymphatic: Mild aortoiliac atherosclerotic disease. The IVC is unremarkable. No portal venous gas. Top-normal retroperitoneal lymph nodes, likely reactive. Reproductive: The prostate and seminal vesicles are grossly unremarkable. No pelvic mass. Other: Small fat containing umbilical hernia. Musculoskeletal: Degenerative changes of the spine. Lumbar posterior fusion. No acute osseous pathology. IMPRESSION: 1. Acute pancreatitis. No abscess or pseudocyst. 2. Cirrhosis. 3. Aortic Atherosclerosis (ICD10-I70.0). Electronically Signed   By: Anner Crete M.D.   On: 09/02/2020 18:16   ECHOCARDIOGRAM COMPLETE  Result Date: 09/04/2020    ECHOCARDIOGRAM REPORT   Patient Name:   ISAHIA HOLLERBACH Date of Exam: 09/04/2020 Medical Rec #:  [id number]     Height:       68.0 in Accession #:    [id number]    Weight:       221.1 lb Date of Birth:  July 21, 1956     BSA:          2.133 m Patient Age:    33 years      BP:           149/99 mmHg Patient Gender: M              HR:           82 bpm. Exam Location:  [facility] Procedure: 2D Echo, Cardiac Doppler and Color Doppler Indications:    Abdominal distension [[id number]]  History:        Patient has no prior history of Echocardiogram examinations.                 Risk Factors:Hypertension and Dyslipidemia. Cirrhosis ([place])                 (From Hx), History of hepatitis C, Cirrhosis of liver without                 ascites.  Sonographer:  Alvino Chapel RCS Referring Phys: [id number] [place]  1. Left ventricular ejection fraction, by estimation, is 60 to 65%. The left ventricle has normal function. The left ventricle has no regional wall motion abnormalities. There is moderate left ventricular hypertrophy. Left ventricular diastolic parameters are indeterminate.  2. Right ventricular systolic function is normal. The right ventricular size is normal.  3. The mitral valve is grossly normal. Trivial mitral valve regurgitation.  4. The aortic valve is tricuspid. Aortic valve regurgitation is not visualized.  5. Aortic dilatation noted. There is mild dilatation of the aortic root, measuring 39 mm.  6. The inferior vena cava is normal in size with greater than 50% respiratory variability, suggesting right atrial pressure of 3 mmHg. FINDINGS  Left Ventricle: Left ventricular ejection fraction, by estimation, is 60 to 65%. The left ventricle has normal function. The left ventricle has no regional wall motion abnormalities. The left ventricular internal cavity size was normal in size. There is  moderate left ventricular hypertrophy. Left ventricular diastolic parameters are indeterminate. Right Ventricle: The right ventricular size is normal. No increase in right ventricular wall thickness. Right ventricular systolic function is normal. Left Atrium: Left atrial size was normal in size. Right Atrium: Right atrial size was normal in size. Pericardium: There is no evidence of pericardial effusion. Mitral Valve: The mitral valve  is grossly normal. Trivial mitral valve regurgitation. Tricuspid Valve: The tricuspid valve is grossly normal. Tricuspid valve regurgitation is trivial. Aortic Valve: The aortic valve is tricuspid. There is mild aortic valve annular calcification. Aortic valve regurgitation is not visualized. Pulmonic Valve: The pulmonic valve was grossly normal. Pulmonic valve regurgitation is trivial. Aorta: Aortic dilatation noted. There is mild dilatation of the aortic root, measuring 39 mm. Venous: The inferior vena cava is normal in size with greater than 50% respiratory variability, suggesting right atrial pressure of 3 mmHg. IAS/Shunts: No atrial level shunt detected by color flow Doppler.  LEFT VENTRICLE PLAX 2D LVIDd:         5.10 cm  Diastology LVIDs:         2.80 cm  LV e' medial:    7.18 cm/s LV PW:         1.00 cm  LV E/e' medial:  10.8 LV IVS:        1.30 cm  LV e' lateral:   6.96 cm/s LVOT diam:     2.00 cm  LV E/e' lateral: 11.1 LV SV:         52 LV SV Index:   24 LVOT Area:     3.14 cm  RIGHT VENTRICLE RV S prime:     19.70 cm/s TAPSE (M-mode): 2.1 cm LEFT ATRIUM             Index       RIGHT ATRIUM           Index LA diam:        3.00 cm 1.41 cm/m  RA Area:     18.20 cm LA Vol (A2C):   47.7 ml 22.36 ml/m RA Volume:   52.20 ml  24.47 ml/m LA Vol (A4C):   50.1 ml 23.49 ml/m LA Biplane Vol: 49.3 ml 23.11 ml/m  AORTIC VALVE LVOT Vmax:   106.00 cm/s LVOT Vmean:  71.000 cm/s LVOT VTI:    0.166 m  AORTA Ao Root diam: 3.90 cm MITRAL VALVE MV Area (PHT): 3.01 cm     SHUNTS MV Decel Time: 252 msec     Systemic  VTI:  0.17 m MV E velocity: 77.60 cm/s   Systemic Diam: 2.00 cm MV A velocity: 116.00 cm/s MV E/A ratio:  0.67 Rozann Lesches MD Electronically signed by Rozann Lesches MD Signature Date/Time: 09/04/2020/5:06:08 PM    Final    [place] ASCITES (ABDOMEN LIMITED)  Result Date: 09/04/2020 CLINICAL DATA:  Abdominal distension with concern for ascites EXAM: LIMITED ABDOMEN ULTRASOUND FOR ASCITES TECHNIQUE: Limited  ultrasound survey for ascites was performed in all four abdominal quadrants. COMPARISON:  Abdominal ultrasound September 03, 2020; CT abdomen and pelvis September 02, 2020 FINDINGS: There is no evident ascites.  Peristalsing bowel seen. IMPRESSION: No evident ascites. Electronically Signed   By: Lowella Grip III M.D.   On: 09/04/2020 12:04   US Abdomen Limited RUQ (LIVER/GB)  Result Date: 09/03/2020 CLINICAL DATA:  Acute pancreatitis. EXAM: ULTRASOUND ABDOMEN LIMITED RIGHT UPPER QUADRANT COMPARISON:  CT 09/02/2020.  MRI/MRCP 02/11/2020. FINDINGS: Gallbladder: No gallstones or wall thickening visualized. No sonographic Murphy sign noted by sonographer. Common bile duct: Diameter: 8.9 mm. Similar finding noted on prior MRI/MRCP of 02/11/2028. Liver: Heterogeneous nodular hepatic parenchymal pattern again noted. Cirrhosis cannot be excluded. Portal vein is patent on color Doppler imaging with normal direction of blood flow towards the liver. Other: None. IMPRESSION: 1. No gallstones. 2. Dilated common bile duct at 8.9 mm. Similar finding noted on prior MRI/MRCP of 02/11/2028. No focal obstructing abnormality is identified. 3. Heterogeneous nodular hepatic parenchymal pattern again noted. Cirrhosis cannot be excluded. Electronically Signed   By: Marcello Moores  Register   On: 09/03/2020 07:57      Subjective: Pt tolerated soft diet well.  No nausea, abdominal pain or vomiting. He would like to go home.   Discharge Exam: Vitals:   09/06/20 2105 09/07/20 0504  BP: (!) 143/99 (!) 147/97  Pulse: 76 70  Resp: 18 17  Temp: 99 F (37.2 C) 97.9 F (36.6 C)  SpO2: 97% 97%   Vitals:   09/06/20 0605 09/06/20 1409 09/06/20 2105 09/07/20 0504  BP: (!) 126/109 (!) 150/99 (!) 143/99 (!) 147/97  Pulse: 75 71 76 70  Resp: 16 20 18 17   Temp: 98.6 F (37 C) 97.7 F (36.5 C) 99 F (37.2 C) 97.9 F (36.6 C)  TempSrc: Oral Oral Oral   SpO2: 98% 98% 97% 97%  Weight:      Height:       General: Pt is alert,  awake, not in acute distress Cardiovascular: normal S1/S2 +, no rubs, no gallops Respiratory: CTA bilaterally, no wheezing, no rhonchi Abdominal: Soft, NT, ND, bowel sounds + Extremities: no edema, no cyanosis   The results of significant diagnostics from this hospitalization (including imaging, microbiology, ancillary and laboratory) are listed below for reference.     Microbiology: Recent Results (from the past 240 hour(s))  Resp Panel by RT-PCR (Flu A&B, Covid) Nasopharyngeal Swab     Status: None   Collection Time: 09/02/20  7:03 PM   Specimen: Nasopharyngeal Swab; Nasopharyngeal(NP) swabs in vial transport medium  Result Value Ref Range Status   SARS Coronavirus 2 by RT PCR NEGATIVE NEGATIVE Final    Comment: (NOTE) SARS-CoV-2 target nucleic acids are NOT DETECTED.  The SARS-CoV-2 RNA is generally detectable in upper respiratory specimens during the acute phase of infection. The lowest concentration of SARS-CoV-2 viral copies this assay can detect is 138 copies/mL. A negative result does not preclude SARS-Cov-2 infection and should not be used as the sole basis for treatment or other patient management decisions. A negative result  may occur with  improper specimen collection/handling, submission of specimen other than nasopharyngeal swab, presence of viral mutation(s) within the areas targeted by this assay, and inadequate number of viral copies(<138 copies/mL). A negative result must be combined with clinical observations, patient history, and epidemiological information. The expected result is Negative.  Fact Sheet for Patients:  EntrepreneurPulse.com.au  Fact Sheet for Healthcare Providers:  IncredibleEmployment.be  This test is no t yet approved or cleared by the [place] FDA and  has been authorized for detection and/or diagnosis of SARS-CoV-2 by FDA under an Emergency Use Authorization (EUA). This EUA will remain  in effect  (meaning this test can be used) for the duration of the COVID-19 declaration under Section 564(b)(1) of the Act, 21 U.S.C.section 360bbb-3(b)(1), unless the authorization is terminated  or revoked sooner.       Influenza A by PCR NEGATIVE NEGATIVE Final   Influenza B by PCR NEGATIVE NEGATIVE Final    Comment: (NOTE) The Xpert Xpress SARS-CoV-2/FLU/RSV plus assay is intended as an aid in the diagnosis of influenza from Nasopharyngeal swab specimens and should not be used as a sole basis for treatment. Nasal washings and aspirates are unacceptable for Xpert Xpress SARS-CoV-2/FLU/RSV testing.  Fact Sheet for Patients: EntrepreneurPulse.com.au  Fact Sheet for Healthcare Providers: IncredibleEmployment.be  This test is not yet approved or cleared by the [place] FDA and has been authorized for detection and/or diagnosis of SARS-CoV-2 by FDA under an Emergency Use Authorization (EUA). This EUA will remain in effect (meaning this test can be used) for the duration of the COVID-19 declaration under Section 564(b)(1) of the Act, 21 U.S.C. section 360bbb-3(b)(1), unless the authorization is terminated or revoked.  Performed at [facility], [address] [place], [place] [address]      Labs: BNP (last 3 results) No results for input(s): BNP in the last 8760 hours. Basic Metabolic Panel: Recent Labs  Lab 09/03/20 0517 09/04/20 0845 09/05/20 0350 09/06/20 0430 09/07/20 0616  NA 133* 132* 134* 135 134*  K 3.6 3.9 4.3 4.4 4.3  CL 93* 96* 100 100 101  CO2 28 27 23 24 23   GLUCOSE 146* 133* 115* 109* 140*  BUN 24* 19 17 18  24*  CREATININE 1.82* 1.76* 1.67* 1.70* 1.76*  CALCIUM 9.3 9.3 9.4 9.5 9.6  MG 1.8  --   --   --   --   PHOS 3.4  --   --   --   --    Liver Function Tests: Recent Labs  Lab 09/02/20 1345 09/03/20 0517 09/04/20 0845  AST 31 23 20   ALT 31 25 22   ALKPHOS 87 76 67  BILITOT 0.8 1.1 0.8  PROT 9.0* 8.1 7.8   ALBUMIN 3.8 3.5 3.3*   Recent Labs  Lab 09/02/20 1345  LIPASE 292*   No results for input(s): AMMONIA in the last 168 hours. CBC: Recent Labs  Lab 09/02/20 1345 09/03/20 0517 09/04/20 0845 09/06/20 0430  WBC 8.5 8.3 7.1 5.5  HGB 16.6 16.1 16.0 15.5  HCT 49.6 47.6 48.3 46.8  MCV 89.9 90.2 90.4 89.1  PLT 200 204 185 176   Cardiac Enzymes: No results for input(s): CKTOTAL, CKMB, CKMBINDEX, TROPONINI in the last 168 hours. BNP: Invalid input(s): POCBNP CBG: No results for input(s): GLUCAP in the last 168 hours. D-Dimer No results for input(s): DDIMER in the last 72 hours. Hgb A1c No results for input(s): HGBA1C in the last 72 hours. Lipid Profile No results for input(s): CHOL, HDL, LDLCALC,  TRIG, CHOLHDL, LDLDIRECT in the last 72 hours. Thyroid function studies No results for input(s): TSH, T4TOTAL, T3FREE, THYROIDAB in the last 72 hours.  Invalid input(s): FREET3 Anemia work up No results for input(s): VITAMINB12, FOLATE, FERRITIN, TIBC, IRON, RETICCTPCT in the last 72 hours. Urinalysis    Component Value Date/Time   COLORURINE YELLOW 09/02/2020 [place] 09/02/2020 1201   LABSPEC 1.015 09/02/2020 1201   PHURINE 8.0 09/02/2020 1201   GLUCOSEU NEGATIVE 09/02/2020 1201   HGBUR NEGATIVE 09/02/2020 1201   [place] 09/02/2020 [place] 09/02/2020 1201   PROTEINUR >=300 (A) 09/02/2020 1201   NITRITE NEGATIVE 09/02/2020 1201   LEUKOCYTESUR NEGATIVE 09/02/2020 1201   Sepsis Labs Invalid input(s): PROCALCITONIN,  WBC,  LACTICIDVEN Microbiology Recent Results (from the past 240 hour(s))  Resp Panel by RT-PCR (Flu A&B, Covid) Nasopharyngeal Swab     Status: None   Collection Time: 09/02/20  7:03 PM   Specimen: Nasopharyngeal Swab; Nasopharyngeal(NP) swabs in vial transport medium  Result Value Ref Range Status   SARS Coronavirus 2 by RT PCR NEGATIVE NEGATIVE Final    Comment: (NOTE) SARS-CoV-2 target nucleic acids are NOT  DETECTED.  The SARS-CoV-2 RNA is generally detectable in upper respiratory specimens during the acute phase of infection. The lowest concentration of SARS-CoV-2 viral copies this assay can detect is 138 copies/mL. A negative result does not preclude SARS-Cov-2 infection and should not be used as the sole basis for treatment or other patient management decisions. A negative result may occur with  improper specimen collection/handling, submission of specimen other than nasopharyngeal swab, presence of viral mutation(s) within the areas targeted by this assay, and inadequate number of viral copies(<138 copies/mL). A negative result must be combined with clinical observations, patient history, and epidemiological information. The expected result is Negative.  Fact Sheet for Patients:  EntrepreneurPulse.com.au  Fact Sheet for Healthcare Providers:  IncredibleEmployment.be  This test is no t yet approved or cleared by the [place] FDA and  has been authorized for detection and/or diagnosis of SARS-CoV-2 by FDA under an Emergency Use Authorization (EUA). This EUA will remain  in effect (meaning this test can be used) for the duration of the COVID-19 declaration under Section 564(b)(1) of the Act, 21 U.S.C.section 360bbb-3(b)(1), unless the authorization is terminated  or revoked sooner.       Influenza A by PCR NEGATIVE NEGATIVE Final   Influenza B by PCR NEGATIVE NEGATIVE Final    Comment: (NOTE) The Xpert Xpress SARS-CoV-2/FLU/RSV plus assay is intended as an aid in the diagnosis of influenza from Nasopharyngeal swab specimens and should not be used as a sole basis for treatment. Nasal washings and aspirates are unacceptable for Xpert Xpress SARS-CoV-2/FLU/RSV testing.  Fact Sheet for Patients: EntrepreneurPulse.com.au  Fact Sheet for Healthcare Providers: IncredibleEmployment.be  This test is not yet  approved or cleared by the [place] FDA and has been authorized for detection and/or diagnosis of SARS-CoV-2 by FDA under an Emergency Use Authorization (EUA). This EUA will remain in effect (meaning this test can be used) for the duration of the COVID-19 declaration under Section 564(b)(1) of the Act, 21 U.S.C. section 360bbb-3(b)(1), unless the authorization is terminated or revoked.  Performed at [facility], [address], [place], [place] [address]    Time coordinating discharge: 35 mins  SIGNED:  Irwin Brakeman, MD  [facility] 09/07/2020, 8:52 AM How to contact the [facility] Attending or Consulting provider [place] or covering provider during after  hours 7P -7A, for this patient?  1. Check the care team in [facility] and look for a) attending/consulting [facility] provider listed and b) the [facility] team listed 2. Log into [email or web address] and use [place]'s universal password to access. If you do not have the password, please contact the hospital operator. 3. Locate the [facility] provider you are looking for under [facility] and page to a number that you can be directly reached. 4. If you still have difficulty reaching the provider, please page the [facility] (Director on Call) for the Hospitalists listed on amion for assistance.

## 2020-09-07 NOTE — Discharge Instructions (Signed)
Acute Pancreatitis  Acute pancreatitis happens when the pancreas gets swollen. The pancreas is a large gland in the body that helps to control blood sugar. It also makes enzymes that help to digest food. This condition can last a few days and cause serious problems. The lungs, heart, and kidneys may stop working. What are the causes? Causes include:  Alcohol abuse.  Drug abuse.  Gallstones.  A tumor in the pancreas. Other causes include:  Some medicines.  Some chemicals.  Diabetes.  An infection.  Damage caused by an accident.  The poison (venom) from a scorpion bite.  Belly (abdominal) surgery.  The body's defense system (immune system) attacking the pancreas (autoimmune pancreatitis).  Genes that are passed from parent to child (inherited). In some cases, the cause is not known. What are the signs or symptoms?  Pain in the upper belly that may be felt in the back. The pain may be very bad.  Swelling of the belly.  Feeling sick to your stomach (nauseous) and throwing up (vomiting).  Fever. How is this treated? You will likely have to stay in the hospital. Treatment may include:  Pain medicine.  Fluid through an IV tube.  Placing a tube in the stomach to take out the stomach contents. This may help you stop throwing up.  Not eating for 3-4 days.  Antibiotic medicines, if you have an infection.  Treating any other problems that may be the cause.  Steroid medicines, if your problem is caused by your defense system attacking your body's own tissues.  Surgery. Follow these instructions at home: Eating and drinking   Follow instructions from your doctor about what to eat and drink.  Eat foods that do not have a lot of fat in them.  Eat small meals often. Do not eat big meals.  Drink enough fluid to keep your pee (urine) pale yellow.  Do not drink alcohol if it caused your condition. Medicines  Take over-the-counter and prescription medicines only  as told by your doctor.  Ask your doctor if the medicine prescribed to you: ? Requires you to avoid driving or using heavy machinery. ? Can cause trouble pooping (constipation). You may need to take steps to prevent or treat trouble pooping:  Take over-the-counter or prescription medicines.  Eat foods that are high in fiber. These include beans, whole grains, and fresh fruits and vegetables.  Limit foods that are high in fat and sugar. These include fried or sweet foods. General instructions  Do not use any products that contain nicotine or tobacco, such as cigarettes, e-cigarettes, and chewing tobacco. If you need help quitting, ask your doctor.  Get plenty of rest.  Check your blood sugar at home as told by your doctor.  Keep all follow-up visits as told by your doctor. This is important. Contact a doctor if:  You do not get better as quickly as expected.  You have new symptoms.  Your symptoms get worse.  You have pain or weakness that lasts a long time.  You keep feeling sick to your stomach.  You get better and then you have pain again.  You have a fever. Get help right away if:  You cannot eat or keep fluids down.  Your pain gets very bad.  Your skin or the white part of your eyes turns yellow.  You have sudden swelling in your belly.  You throw up.  You feel dizzy or you pass out (faint).  Your blood sugar is high (over 300  mg/dL). Summary  Acute pancreatitis happens when the pancreas gets swollen.  This condition is often caused by alcohol abuse, drug abuse, or gallstones.  You will likely have to stay in the hospital for treatment. This information is not intended to replace advice given to you by your health care provider. Make sure you discuss any questions you have with your health care provider. Document Revised: 07/11/2018 Document Reviewed: 07/11/2018 Elsevier Patient Education  2020 [place].   Pancreatitis Eating Plan Pancreatitis is  when your pancreas becomes irritated and swollen (inflamed). The pancreas is a small organ located behind your stomach. It helps your body digest food and regulate your blood sugar. Pancreatitis can affect how your body digests food, especially foods with fat. You may also have other symptoms such as abdominal pain or nausea. When you have pancreatitis, following a low-fat eating plan may help you manage symptoms and recover more quickly. Work with your health care provider or a diet and nutrition specialist (dietitian) to create an eating plan that is right for you. What are tips for following this plan? Reading food labels Use the information on food labels to help keep track of how much fat you eat:  Check the serving size.  Look for the amount of total fat in grams (g) in one serving. ? Low-fat foods have 3 g of fat or less per serving. ? Fat-free foods have 0.5 g of fat or less per serving.  Keep track of how much fat you eat based on how many servings you eat. ? For example, if you eat two servings, the amount of fat you eat will be two times what is listed on the label. Shopping   Buy low-fat or nonfat foods, such as: ? Fresh, frozen, or canned fruits and vegetables. ? Grains, including pasta, bread, and rice. ? Lean meat, poultry, fish, and other protein foods. ? Low-fat or nonfat dairy.  Avoid buying bakery products and other sweets made with whole milk, butter, and eggs.  Avoid buying snack foods with added fat, such as anything with butter or cheese flavoring. Cooking  Remove skin from poultry, and remove extra fat from meat.  Limit the amount of fat and oil you use to 6 teaspoons or less per day.  Cook using low-fat methods, such as boiling, broiling, grilling, steaming, or baking.  Use spray oil to cook. Add fat-free chicken broth to add flavor and moisture.  Avoid adding cream to thicken soups or sauces. Use other thickeners such as corn starch or tomato paste. Meal  planning   Eat a low-fat diet as told by your dietitian. For most people, this means having no more than 55-65 grams of fat each day.  Eat small, frequent meals throughout the day. For example, you may have 5-6 small meals instead of 3 large meals.  Drink enough fluid to keep your urine pale yellow.  Do not drink alcohol. Talk to your health care provider if you need help stopping.  Limit how much caffeine you have, including black coffee, black and green tea, caffeinated soft drinks, and energy drinks. General information  Let your health care provider or dietitian know if you have unplanned weight loss on this eating plan.  You may be instructed to follow a clear liquid diet during a flare of symptoms. Talk with your health care provider about how to manage your diet during symptoms of a flare.  Take any vitamins or supplements as told by your health care provider.  Work  with a dietitian, especially if you have other conditions such as obesity or diabetes mellitus. What foods should I avoid? Fruits Fried fruits. Fruits served with butter or cream. Vegetables Fried vegetables. Vegetables cooked with butter, cheese, or cream. Grains Biscuits, waffles, donuts, pastries, and croissants. Pies and cookies. Butter-flavored popcorn. Regular crackers. Meats and other protein foods Fatty cuts of meat. Poultry with skin. Organ meats. Bacon, sausage, and cold cuts. Whole eggs. Nuts and nut butters. Dairy Whole and 2% milk. Whole milk yogurt. Whole milk ice cream. Cream and half-and-half. Cream cheese. Sour cream. Cheese. Beverages Wine, beer, and liquor. The items listed above may not be a complete list of foods and beverages to avoid. Contact a dietitian for more information. Summary  Pancreatitis can affect how your body digests food, especially foods with fat.  When you have pancreatitis, it is recommended that you follow a low-fat eating plan to help you recover more quickly and  manage symptoms. For most people, this means limiting fat to no more than 55-65 grams per day.  Do not drink alcohol. Limit the amount of caffeine you have, and drink enough fluid to keep your urine pale yellow. This information is not intended to replace advice given to you by your health care provider. Make sure you discuss any questions you have with your health care provider. Document Revised: 01/12/2019 Document Reviewed: 12/28/2017 Elsevier Patient Education  [place].    IMPORTANT INFORMATION: PAY CLOSE ATTENTION   PHYSICIAN DISCHARGE INSTRUCTIONS  Follow with Primary care provider  [facility], [facility]  and other consultants as instructed by your Hospitalist Physician  [place] IF SYMPTOMS COME BACK, WORSEN OR NEW PROBLEM DEVELOPS   Please note: You were cared for by a hospitalist during your hospital stay. Every effort will be made to forward records to your primary care provider.  You can request that your primary care provider send for your hospital records if they have not received them.  Once you are discharged, your primary care physician will handle any further medical issues. Please note that NO REFILLS for any discharge medications will be authorized once you are discharged, as it is imperative that you return to your primary care physician (or establish a relationship with a primary care physician if you do not have one) for your post hospital discharge needs so that they can reassess your need for medications and monitor your lab values.  Please get a complete blood count and chemistry panel checked by your Primary MD at your next visit, and again as instructed by your Primary MD.  Get Medicines reviewed and adjusted: Please take all your medications with you for your next visit with your Primary MD  Laboratory/radiological data: Please request your Primary MD to go over all hospital tests and procedure/radiological  results at the follow up, please ask your primary care provider to get all Hospital records sent to his/her office.  In some cases, they will be blood work, cultures and biopsy results pending at the time of your discharge. Please request that your primary care provider follow up on these results.  If you are diabetic, please bring your blood sugar readings with you to your follow up appointment with primary care.    Please call and make your follow up appointments as soon as possible.    Also Note the following: If you experience worsening of your admission symptoms, develop shortness of breath, life threatening emergency, suicidal or homicidal thoughts  you must seek medical attention immediately by calling 911 or calling your MD immediately  if symptoms less severe.  You must read complete instructions/literature along with all the possible adverse reactions/side effects for all the Medicines you take and that have been prescribed to you. Take any new Medicines after you have completely understood and accpet all the possible adverse reactions/side effects.   Do not drive when taking Pain medications or sleeping medications (Benzodiazepines)  Do not take more than prescribed Pain, Sleep and Anxiety Medications. It is not advisable to combine anxiety,sleep and pain medications without talking with your primary care practitioner  Special Instructions: If you have smoked or chewed Tobacco  in the last 2 yrs please stop smoking, stop any regular Alcohol  and or any Recreational drug use.  Wear Seat belts while driving.  Do not drive if taking any narcotic, mind altering or controlled substances or recreational drugs or alcohol.

## 2020-09-29 NOTE — Progress Notes (Deleted)
Cardiology Office Note    Date:  09/29/2020   ID:  Joshua Maldonado, DOB 07-05-1956, MRN [id number]  PCP:  [facility]  Cardiologist: Rozann Lesches, MD    No chief complaint on file.   History of Present Illness:    Joshua Maldonado is a 64 y.o. male with past medical history of HTN, HLD, OSA (not on CPAP) and cirrhosis with history of Hepatitis C who presents to the office today for hospital follow-up.   He was most recently admitted to [facility] from 11/29 - 09/07/2020 for evaluation of abdominal pain and found to have acute pancreatitis. Was treated with IV pain medications along with IVF and a clear liquid diet. Cardiology was consulted during admission as he did have episodes of SVT during admission and would spontaneously convert to NSR. An echocardiogram was obtained and showed a preserved EF of 60-65% with no regional WMA. 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 He was started on Lopressor 50mg  BID for his SVT along with being on Amlodipine and Hydralazine for BP. PTA HCTZ and Lisinopril were discontinued given his renal dysfunction (creatinine 1.76 at the time of hospital discharge).   - sleep study?  Past Medical History:  Diagnosis Date  . Cirrhosis ([place])    Per patient, diagnosed 3-4 years ago at [facility]  Reports liver biopsy at [facility].  Suspected to be secondary to hepatitis C.  . Gout   . Hepatitis C    Per patient, s/p treatment with Harvoni at [facility]  . HLD (hyperlipidemia)   . Hypertension   . Sleep apnea     Past Surgical History:  Procedure Laterality Date  . BACK SURGERY    . COLONOSCOPY    . LIVER BIOPSY      Current Medications: Outpatient Medications Prior to Visit  Medication Sig Dispense Refill  . allopurinol (ZYLOPRIM) 100 MG tablet Take 100 mg by mouth daily as needed (gout).     [name] amLODipine (NORVASC) 10 MG tablet Take 1 tablet (10 mg total) by mouth daily. 30 tablet 2  .  atorvastatin (LIPITOR) 10 MG tablet Take 10 mg by mouth daily.    . cyclobenzaprine (FLEXERIL) 10 MG tablet Take 10 mg by mouth 3 (three) times daily as needed for muscle spasms.    . DULoxetine (CYMBALTA) 30 MG capsule Take 30 mg by mouth daily.    . fluticasone (FLONASE) 50 MCG/ACT nasal spray Place 1 spray into both nostrils daily as needed for allergies or rhinitis.    [name] gabapentin (NEURONTIN) 600 MG tablet Take 1 tablet (600 mg total) by mouth 2 (two) times daily as needed.    . hydrALAZINE (APRESOLINE) 10 MG tablet Take 1 tablet (10 mg total) by mouth every 8 (eight) hours. 90 tablet 2  . metoprolol tartrate (LOPRESSOR) 50 MG tablet Take 1 tablet (50 mg total) by mouth 2 (two) times daily. 60 tablet 2  . Multiple Vitamin (MULTIVITAMIN WITH MINERALS) TABS tablet Take 1 tablet by mouth daily.    . pantoprazole (PROTONIX) 40 MG tablet TAKE 1 TABLET (40 MG TOTAL) BY MOUTH 2 (TWO) TIMES DAILY BEFORE A MEAL. 60 tablet 3  . sucralfate (CARAFATE) 1 g tablet Take 1 tablet (1 g total) by mouth daily.    . Vitamin D, Ergocalciferol, (DRISDOL) 1.25 MG (50000 UNIT) CAPS capsule Take 50,000 Units by mouth every 7 (seven) days.      No facility-administered medications  prior to visit.     Allergies:   Patient has no known allergies.   Social History   Socioeconomic History  . Marital status: Single    Spouse name: Not on file  . Number of children: Not on file  . Years of education: Not on file  . Highest education level: Not on file  Occupational History  . Not on file  Tobacco Use  . Smoking status: Current Every Day Smoker    Packs/day: 0.25    Years: 12.00    Pack years: 3.00    Types: Cigarettes  . Smokeless tobacco: Never Used  Vaping Use  . Vaping Use: Never used  Substance and Sexual Activity  . Alcohol use: Yes    Alcohol/week: 5.0 standard drinks    Types: 2 Cans of beer, 3 Shots of liquor per week    Comment: 3 drinks a week  . Drug use: Never  . Sexual activity: Yes   Other Topics Concern  . Not on file  Social History Narrative  . Not on file   Social Determinants of Health   Financial Resource Strain: Not on file  Food Insecurity: Not on file  Transportation Needs: Not on file  Physical Activity: Not on file  Stress: Not on file  Social Connections: Not on file     Family History:  The patient's ***family history includes Hypertension in his father.   Review of Systems:   Please see the history of present illness.     General:  No chills, fever, night sweats or weight changes.  Cardiovascular:  No chest pain, dyspnea on exertion, edema, orthopnea, palpitations, paroxysmal nocturnal dyspnea. Dermatological: No rash, lesions/masses Respiratory: No cough, dyspnea Urologic: No hematuria, dysuria Abdominal:   No nausea, vomiting, diarrhea, bright red blood per rectum, melena, or hematemesis Neurologic:  No visual changes, wkns, changes in mental status. All other systems reviewed and are otherwise negative except as noted above.   Physical Exam:    VS:  There were no vitals taken for this visit.   General: Well developed, well nourished,male appearing in no acute distress. Head: Normocephalic, atraumatic. Neck: No carotid bruits. JVD not elevated.  Lungs: Respirations regular and unlabored, without wheezes or rales.  Heart: ***Regular rate and rhythm. No S3 or S4.  No murmur, no rubs, or gallops appreciated. Abdomen: Appears non-distended. No obvious abdominal masses. Msk:  Strength and tone appear normal for age. No obvious joint deformities or effusions. Extremities: No clubbing or cyanosis. No edema.  Distal pedal pulses are 2+ bilaterally. Neuro: Alert and oriented X 3. Moves all extremities spontaneously. No focal deficits noted. Psych:  Responds to questions appropriately with a normal affect. Skin: No rashes or lesions noted  Wt Readings from Last 3 Encounters:  09/03/20 221 lb 1.9 oz (100.3 kg)  08/08/20 225 lb (102.1 kg)   05/30/20 230 lb 12.8 oz (104.7 kg)        Studies/Labs Reviewed:   EKG:  EKG is*** ordered today.  The ekg ordered today demonstrates ***  Recent Labs: 09/03/2020: Magnesium 1.8 09/04/2020: ALT 22 09/06/2020: Hemoglobin 15.5; Platelets 176 09/07/2020: BUN 24; Creatinine, Ser 1.76; Potassium 4.3; Sodium 134   Lipid Panel No results found for: CHOL, TRIG, HDL, CHOLHDL, VLDL, LDLCALC, LDLDIRECT  Additional studies/ records that were reviewed today include:   Echocardiogram: 09/04/2020 IMPRESSIONS    1. Left ventricular ejection fraction, by estimation, is 60 to 65%. The  left ventricle has normal function. The left ventricle has no  regional  wall motion abnormalities. There is moderate left ventricular hypertrophy.  Left ventricular diastolic  parameters are indeterminate.  2. Right ventricular systolic function is normal. The right ventricular  size is normal.  3. The mitral valve is grossly normal. Trivial mitral valve  regurgitation.  4. The aortic valve is tricuspid. Aortic valve regurgitation is not  visualized.  5. Aortic dilatation noted. There is mild dilatation of the aortic root,  measuring 39 mm.  6. The inferior vena cava is normal in size with greater than 50%  respiratory variability, suggesting right atrial pressure of 3 mmHg.   Assessment:    No diagnosis found.   Plan:   In order of problems listed above:  1. ***    Shared Decision Making/Informed Consent:   {Are you ordering a CV Procedure (e.g. stress test, cath, DCCV, TEE, etc)?   Press F2        :[id number]}    Medication Adjustments/Labs and Tests Ordered: Current medicines are reviewed at length with the patient today.  Concerns regarding medicines are outlined above.  Medication changes, Labs and Tests ordered today are listed in the Patient Instructions below. There are no Patient Instructions on file for this visit.   Signed, Erma Heritage, PA-C  09/29/2020 4:31 PM     [place] [facility] [address] [place], [place] [address] Phone: [phone] Fax: [phone]

## 2020-09-30 ENCOUNTER — Ambulatory Visit: Payer: Medicaid Other | Admitting: Student

## 2020-10-01 ENCOUNTER — Telehealth: Payer: Self-pay

## 2020-10-01 ENCOUNTER — Ambulatory Visit: Payer: Medicaid Other | Admitting: Nurse Practitioner

## 2020-10-01 NOTE — Telephone Encounter (Signed)
Error

## 2020-10-31 NOTE — Progress Notes (Unsigned)
Referring Provider: Alanson Puls [facility] Primary Care Physician:  [facility] Primary GI Physician: Dr. Gala Romney  No chief complaint on file.   HPI:   Joshua Maldonado is a 65 y.o. 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  Chronic GERD, upper abdominal pain, and mild constipation.  We have previously attempted to complete EGD twice.  First attempt was canceled as patient did not remain n.p.o. as advised.  Second attempt canceled due to diastolic BP [id number].   Patient was last seen in our office 05/30/2020.  He reported his abdominal pain was much improved after increasing Protonix to twice daily.  Had one episode of black stools a couple months prior, but none since.  No other significant upper or lower GI symptoms.  Planned to proceed with EGD for variceal screening, but this was canceled as per above.  Patient was admitted to [facility] 09/02/2020-09/07/2020.  He presented with epigastric abdominal pain and was found to have acute uncomplicated pancreatitis on CT A/P without contrast.  Lipase 292.  Follow-up ultrasound no evidence of gallstones.  CBD continues to be dilated with without change.  His work-up also showed hyponatremia and hyperglycemia.  Creatinine 1.94.  LFTs remain normal.  He was treated supportively with IV fluids and pain medications.  He had clinical improvement and diet was slowly advanced.  During hospitalization, he was found to have episodes of tachycardia and EKG confirmed SVT.  He was started on Lopressor.  Additionally, BP medications were adjusted during hospitalization due to AKI.  Lisinopril HCTZ were held.  He started on amlodipine 10 mg and Lopressor was titrated to 50 mg twice daily with good control.  Based on labs in hospital, MELD 15.  Patient no showed  to cardiology follow-up on 09/05/20.   Today:   Abdominal Pain :   GERD:   Cirrhosis:   Constipation:   Colon Cancer Screening: We have requested records and received fax from [facility] stating they do not have a colonoscopy on file.   Needs EGD and colonoscopy   Past Medical History:  Diagnosis Date  . Cirrhosis ([place])    Per patient, diagnosed 3-4 years ago at [facility].  Reports liver biopsy at [facility].  Suspected to be secondary to hepatitis C.  . Gout   . Hepatitis C    Per patient, s/p treatment with Harvoni at [facility]  . HLD (hyperlipidemia)   . Hypertension   . Sleep apnea     Past Surgical History:  Procedure Laterality Date  . BACK SURGERY    . COLONOSCOPY    . LIVER BIOPSY      Current Outpatient Medications  Medication Sig Dispense Refill  . allopurinol (ZYLOPRIM) 100 MG tablet Take 100 mg by mouth daily as needed (gout).     [name] amLODipine (NORVASC) 10 MG tablet Take 1 tablet (10 mg total) by mouth daily. 30 tablet 2  . atorvastatin (LIPITOR) 10 MG tablet Take 10 mg by mouth daily.    . cyclobenzaprine (FLEXERIL) 10 MG tablet Take 10 mg by mouth 3 (three) times daily as needed for muscle spasms.    . DULoxetine (CYMBALTA) 30 MG capsule Take 30 mg by mouth daily.    . fluticasone (FLONASE) 50 MCG/ACT nasal spray Place 1 spray into both nostrils  daily as needed for allergies or rhinitis.    [name] gabapentin (NEURONTIN) 600 MG tablet Take 1 tablet (600 mg total) by mouth 2 (two) times daily as needed.    . hydrALAZINE (APRESOLINE) 10 MG tablet Take 1 tablet (10 mg total) by mouth every 8 (eight) hours. 90 tablet 2  . metoprolol tartrate (LOPRESSOR) 50 MG tablet Take 1 tablet (50 mg total) by mouth 2 (two) times daily. 60 tablet 2  . Multiple Vitamin (MULTIVITAMIN WITH MINERALS) TABS tablet Take 1 tablet by mouth daily.    . pantoprazole (PROTONIX) 40 MG tablet TAKE 1 TABLET (40 MG TOTAL) BY MOUTH 2 (TWO) TIMES DAILY BEFORE A MEAL. 60 tablet 3  .  sucralfate (CARAFATE) 1 g tablet Take 1 tablet (1 g total) by mouth daily.    . Vitamin D, Ergocalciferol, (DRISDOL) 1.25 MG (50000 UNIT) CAPS capsule Take 50,000 Units by mouth every 7 (seven) days.      No current facility-administered medications for this visit.    Allergies as of 11/01/2020  . (No Known Allergies)    Family History  Problem Relation Age of Onset  . Hypertension Father   . Colon cancer Neg Hx     Social History   Socioeconomic History  . Marital status: Single    Spouse name: Not on file  . Number of children: Not on file  . Years of education: Not on file  . Highest education level: Not on file  Occupational History  . Not on file  Tobacco Use  . Smoking status: Current Every Day Smoker    Packs/day: 0.25    Years: 12.00    Pack years: 3.00    Types: Cigarettes  . Smokeless tobacco: Never Used  Vaping Use  . Vaping Use: Never used  Substance and Sexual Activity  . Alcohol use: Yes    Alcohol/week: 5.0 standard drinks    Types: 2 Cans of beer, 3 Shots of liquor per week    Comment: 3 drinks a week  . Drug use: Never  . Sexual activity: Yes  Other Topics Concern  . Not on file  Social History Narrative  . Not on file   Social Determinants of Health   Financial Resource Strain: Not on file  Food Insecurity: Not on file  Transportation Needs: Not on file  Physical Activity: Not on file  Stress: Not on file  Social Connections: Not on file    Review of Systems: Gen: Denies fever, chills, anorexia. Denies fatigue, weakness, weight loss.  CV: Denies chest pain, palpitations, syncope, peripheral edema, and claudication. Resp: Denies dyspnea at rest, cough, wheezing, coughing up blood, and pleurisy. GI: Denies vomiting blood, jaundice, and fecal incontinence.   Denies dysphagia or odynophagia. Derm: Denies rash, itching, dry skin Psych: Denies depression, anxiety, memory loss, confusion. No homicidal or suicidal ideation.  Heme: Denies  bruising, bleeding, and enlarged lymph nodes.  Physical Exam: There were no vitals taken for this visit. General:   Alert and oriented. No distress noted. Pleasant and cooperative.  Head:  Normocephalic and atraumatic. Eyes:  Conjuctiva clear without scleral icterus. Mouth:  Oral mucosa pink and moist. Good dentition. No lesions. Heart:  S1, S2 present without murmurs appreciated. Lungs:  Clear to auscultation bilaterally. No wheezes, rales, or rhonchi. No distress.  Abdomen:  +BS, soft, non-tender and non-distended. No rebound or guarding. No HSM or masses noted. Msk:  Symmetrical without gross deformities. Normal posture. Extremities:  Without edema. Neurologic:  Alert and  oriented x4 Psych:  Alert and cooperative. Normal mood and affect.

## 2020-11-01 ENCOUNTER — Ambulatory Visit: Payer: Medicaid Other | Admitting: Gastroenterology

## 2020-11-02 NOTE — Progress Notes (Signed)
Referring Provider: Alanson Puls [facility] Primary Care Physician:  Alanson Puls, [facility] Primary GI Physician: Dr. Gala Romney  Chief Complaint  Patient presents with  . Cirrhosis    F/u  . Abdominal Pain    Upper abd, less than prior, but comes/goes    HPI:   Joshua Maldonado is a 65 y.o. male presenting today with a history of cirrhosis likely secondary to hep C s/p treatment with Harvoni with achieved SVR.  Dilated CBD with normal LFTs and MRI May 2021 suggesting benign variation. Chronic history of GERD, upper abdominal pain, new onset constipation around April 2021.  Reported colonoscopy about 5-6 years ago at [facility], but we requested records and no record of colonoscopy was found.  We have attempted to complete EGD twice.  First attempt in July 2021, but patient did not follow n.p.o. instructions, so procedure was canceled.  Second attempt in November 2021, but procedure canceled due to diastolic BP 123456 mm.  Patient was last seen in our office in August 2021.  Upper abdominal pain had improved on Protonix 40 mg twice daily reported having an episode of black stools a couple months prior, but none since.  No other significant GI concerns.  He was scheduled for EGD, but this was canceled per above.  Patient was admitted to [facility] 11/29-12/01/2020 with acute pancreatitis.  Labs in the ED with normal CBC, hyponatremia, hyperglycemia, creatinine 1.94, lipase 292.  CT A/P with contrast with acute uncomplicated pancreatitis.  RUQ ultrasound with no evidence of gallstones.  Dilated CBD stable.  He was treated supportively with IV fluids, pain medications, etc. patient's diet was advanced. He developed an episode of SVT during admission.  Echocardiogram reassuring.  He was started on Lopressor 50 mg twice daily and maintained NSR.   Meld 15 based on labs during hospitalization.  Today:  Cirrhosis: No swelling in the abdomen or lower extremities, confusion/change in mental  status, scleral icterus, easy bruising or bleeding.   Constipation: Bowels moving well with 2-3 BMs daily.  This is back to his baseline.  Used MiraLAX for a short time then BMs returned to normal.  No longer needing MiraLAX.  No blood in the stool. No black stools.    GERD: Well controlled on Protonix 40 mg twice daily. No nausea, vomiting, or dysphagia.   Overall epigastric abdominal pain is much improved. Every now and then he has a very mild amount of pain. Prior to episode of pancreatitis, he had started drinking a little heavier. Had been drinking about 1/5 every 2 weeks. Currently drinking a couple shots every 3 days. Last used cocaine maybe 2 years ago. Last used marijuana in 2021.   Had follow-up with PCP virtually for HTN. States his PCP does nothing for him. Hasn't taken any of his blood pressure medications over the weekend as he was out of town. Doesn't remember ever starting amlodipine after hospital discharge.     Lost his wife in December.   Past Medical History:  Diagnosis Date  . Cirrhosis [facility])    Per patient, diagnosed at [facility].  Reports liver biopsy at [facility].  Suspected to be secondary to hepatitis C.and ETOH.   . Dilation of biliary tract    Dilated CBD. MRI May 2021 suggesting benign variation.  [name] GERD (gastroesophageal reflux disease)   . Gout   . Hepatitis C    Per patient, s/p treatment with Harvoni at [facility] hep C RNA not detected April  2021.  . HLD (hyperlipidemia)   . Hypertension   . Sleep apnea     Past Surgical History:  Procedure Laterality Date  . BACK SURGERY    . LIVER BIOPSY     Per patient- at [facility]    Current Outpatient Medications  Medication Sig Dispense Refill  . allopurinol (ZYLOPRIM) 100 MG tablet Take 100 mg by mouth daily as needed (gout).     [name] atorvastatin (LIPITOR) 10 MG tablet Take 10 mg by mouth daily.    . DULoxetine (CYMBALTA) 30 MG capsule Take 30 mg by mouth daily.    . fluticasone (FLONASE) 50  MCG/ACT nasal spray Place 1 spray into both nostrils daily as needed for allergies or rhinitis.    [name] gabapentin (NEURONTIN) 600 MG tablet Take 1 tablet (600 mg total) by mouth 2 (two) times daily as needed.    . hydrALAZINE (APRESOLINE) 10 MG tablet Take 1 tablet (10 mg total) by mouth every 8 (eight) hours. 90 tablet 2  . metoprolol tartrate (LOPRESSOR) 50 MG tablet Take 1 tablet (50 mg total) by mouth 2 (two) times daily. 60 tablet 2  . pantoprazole (PROTONIX) 40 MG tablet TAKE 1 TABLET (40 MG TOTAL) BY MOUTH 2 (TWO) TIMES DAILY BEFORE A MEAL. 60 tablet 3  . sucralfate (CARAFATE) 1 g tablet Take 1 tablet (1 g total) by mouth daily.    [name] amLODipine (NORVASC) 10 MG tablet Take 1 tablet (10 mg total) by mouth daily. (Patient not taking: Reported on 11/04/2020) 30 tablet 2   No current facility-administered medications for this visit.    Allergies as of 11/04/2020  . (No Known Allergies)    Family History  Problem Relation Age of Onset  . Hypertension Father   . Colon cancer Neg Hx     Social History   Socioeconomic History  . Marital status: Single    Spouse name: Not on file  . Number of children: Not on file  . Years of education: Not on file  . Highest education level: Not on file  Occupational History  . Not on file  Tobacco Use  . Smoking status: Current Every Day Smoker    Packs/day: 0.25    Years: 12.00    Pack years: 3.00    Types: Cigarettes  . Smokeless tobacco: Never Used  Vaping Use  . Vaping Use: Never used  Substance and Sexual Activity  . Alcohol use: Yes    Alcohol/week: 5.0 standard drinks    Types: 2 Cans of beer, 3 Shots of liquor per week    Comment: couple shots every 3 days.   . Drug use: Not Currently    Types: Marijuana, Cocaine    Comment: Last used Marijuana in 2021. Last use cocaine in 2020.   [name] Sexual activity: Yes  Other Topics Concern  . Not on file  Social History Narrative  . Not on file   Social Determinants of Health   Financial  Resource Strain: Not on file  Food Insecurity: Not on file  Transportation Needs: Not on file  Physical Activity: Not on file  Stress: Not on file  Social Connections: Not on file    Review of Systems: Gen: Denies fever, chills, cold or flulike symptoms, lightheadedness, dizziness, presyncope, syncope. CV: Denies chest pain or heart palpitations. Resp: Denies dyspnea or cough. GI: See HPI. Heme: See HPI  Physical Exam: BP (!) 165/100   Pulse 87   Temp 97.7 F (36.5 C)   Ht  $'5\' 7"'p$  (1.702 m)   Wt 218 lb 9.6 oz (99.2 kg)   BMI 34.24 kg/m  General:   Alert and oriented. No distress noted. Pleasant and cooperative.  Head:  Normocephalic and atraumatic. Eyes:  Conjuctiva clear without scleral icterus. Heart:  S1, S2 present without murmurs appreciated. Lungs:  Clear to auscultation bilaterally. Few scattered rhonchi. No wheezes or rales. No distress.  Abdomen:  +BS. Abdomen is protuberant but soft and non-tender. No rebound or guarding. No HSM or masses noted. Msk:  Symmetrical without gross deformities. Normal posture. Extremities:  Without edema. Neurologic:  Alert and  oriented x4 Psych:  Normal mood and affect.

## 2020-11-04 ENCOUNTER — Other Ambulatory Visit: Payer: Self-pay

## 2020-11-04 ENCOUNTER — Ambulatory Visit (INDEPENDENT_AMBULATORY_CARE_PROVIDER_SITE_OTHER): Payer: Medicaid Other | Admitting: Gastroenterology

## 2020-11-04 ENCOUNTER — Encounter: Payer: Self-pay | Admitting: Gastroenterology

## 2020-11-04 VITALS — BP 165/100 | HR 87 | Temp 97.7°F | Ht 67.0 in | Wt 218.6 lb

## 2020-11-04 DIAGNOSIS — Z8719 Personal history of other diseases of the digestive system: Secondary | ICD-10-CM

## 2020-11-04 DIAGNOSIS — K5900 Constipation, unspecified: Secondary | ICD-10-CM | POA: Diagnosis not present

## 2020-11-04 DIAGNOSIS — Z1211 Encounter for screening for malignant neoplasm of colon: Secondary | ICD-10-CM

## 2020-11-04 DIAGNOSIS — K219 Gastro-esophageal reflux disease without esophagitis: Secondary | ICD-10-CM | POA: Diagnosis not present

## 2020-11-04 DIAGNOSIS — K7460 Unspecified cirrhosis of liver: Secondary | ICD-10-CM | POA: Diagnosis not present

## 2020-11-04 NOTE — Patient Instructions (Addendum)
Please call your primary care provider today to ask about amlodipine.  Appears this medication may have been prescribed at your last hospital discharge for high blood pressure.  Please resume taking your blood pressure medications as prescribed.  Please return to our office in 2-3 weeks for a blood pressure recheck.  If your blood pressure is improved, we will proceed with scheduling upper endoscopy and colonoscopy.  Continue taking Protonix 40 mg twice daily 30 minutes before breakfast and dinner.  For Cirrhosis:  Please discontinue all alcohol use. Avoid all illicit drugs. Follow a low-sodium diet.  No more than 2000 mg/day.  This includes all foods and liquids consumed.  See handout below. Monitor for swelling in your lower extremities, abdomen, yellowing of your eyes, confusion or changes in mental status and let us know if this occurs.  Follow-up in 4 months regardless.   Aliene Altes, PA-C [facility]    Low-Sodium Eating Plan Sodium, which is an element that makes up salt, helps you maintain a healthy balance of fluids in your body. Too much sodium can increase your blood pressure and cause fluid and waste to be held in your body. Your health care provider or dietitian may recommend following this plan if you have high blood pressure (hypertension), kidney disease, liver disease, or heart failure. Eating less sodium can help lower your blood pressure, reduce swelling, and protect your heart, liver, and kidneys. What are tips for following this plan? Reading food labels  The Nutrition Facts label lists the amount of sodium in one serving of the food. If you eat more than one serving, you must multiply the listed amount of sodium by the number of servings.  Choose foods with less than 140 mg of sodium per serving.  Avoid foods with 300 mg of sodium or more per serving. Shopping  Look for lower-sodium products, often labeled as "low-sodium" or "no salt  added."  Always check the sodium content, even if foods are labeled as "unsalted" or "no salt added."  Buy fresh foods. ? Avoid canned foods and pre-made or frozen meals. ? Avoid canned, cured, or processed meats.  Buy breads that have less than 80 mg of sodium per slice.   Cooking  Eat more home-cooked food and less restaurant, buffet, and fast food.  Avoid adding salt when cooking. Use salt-free seasonings or herbs instead of table salt or sea salt. Check with your health care provider or pharmacist before using salt substitutes.  Cook with plant-based oils, such as canola, sunflower, or olive oil.   Meal planning  When eating at a restaurant, ask that your food be prepared with less salt or no salt, if possible. Avoid dishes labeled as brined, pickled, cured, smoked, or made with soy sauce, miso, or teriyaki sauce.  Avoid foods that contain MSG (monosodium glutamate). MSG is sometimes added to [place] food, bouillon, and some canned foods.  Make meals that can be grilled, baked, poached, roasted, or steamed. These are generally made with less sodium. General information Most people on this plan should limit their sodium intake to 1,500-2,000 mg (milligrams) of sodium each day. What foods should I eat? Fruits Fresh, frozen, or canned fruit. Fruit juice. Vegetables Fresh or frozen vegetables. "No salt added" canned vegetables. "No salt added" tomato sauce and paste. Low-sodium or reduced-sodium tomato and vegetable juice. Grains Low-sodium cereals, including oats, puffed wheat and rice, and shredded wheat. Low-sodium crackers. Unsalted rice. Unsalted pasta. Low-sodium bread. Whole-grain breads and whole-grain pasta. Meats and other  proteins Fresh or frozen (no salt added) meat, poultry, seafood, and fish. Low-sodium canned tuna and salmon. Unsalted nuts. Dried peas, beans, and lentils without added salt. Unsalted canned beans. Eggs. Unsalted nut butters. Dairy Milk. Soy milk.  Cheese that is naturally low in sodium, such as ricotta cheese, fresh mozzarella, or Swiss cheese. Low-sodium or reduced-sodium cheese. Cream cheese. Yogurt. Seasonings and condiments Fresh and dried herbs and spices. Salt-free seasonings. Low-sodium mustard and ketchup. Sodium-free salad dressing. Sodium-free light mayonnaise. Fresh or refrigerated horseradish. Lemon juice. Vinegar. Other foods Homemade, reduced-sodium, or low-sodium soups. Unsalted popcorn and pretzels. Low-salt or salt-free chips. The items listed above may not be a complete list of foods and beverages you can eat. Contact a dietitian for more information. What foods should I avoid? Vegetables Sauerkraut, pickled vegetables, and relishes. Olives. [place] fries. Onion rings. Regular canned vegetables (not low-sodium or reduced-sodium). Regular canned tomato sauce and paste (not low-sodium or reduced-sodium). Regular tomato and vegetable juice (not low-sodium or reduced-sodium). Frozen vegetables in sauces. Grains Instant hot cereals. Bread stuffing, pancake, and biscuit mixes. Croutons. Seasoned rice or pasta mixes. Noodle soup cups. Boxed or frozen macaroni and cheese. Regular salted crackers. Self-rising flour. Meats and other proteins Meat or fish that is salted, canned, smoked, spiced, or pickled. Precooked or cured meat, such as sausages or meat loaves. Berniece Salines. Ham. Pepperoni. Hot dogs. Corned beef. Chipped beef. Salt pork. Jerky. Pickled herring. Anchovies and sardines. Regular canned tuna. Salted nuts. Dairy Processed cheese and cheese spreads. Hard cheeses. Cheese curds. Blue cheese. Feta cheese. String cheese. Regular cottage cheese. Buttermilk. Canned milk. Fats and oils Salted butter. Regular margarine. Ghee. Bacon fat. Seasonings and condiments Onion salt, garlic salt, seasoned salt, table salt, and sea salt. Canned and packaged gravies. Worcestershire sauce. Tartar sauce. Barbecue sauce. Teriyaki sauce. Soy sauce,  including reduced-sodium. Steak sauce. Fish sauce. Oyster sauce. Cocktail sauce. Horseradish that you find on the shelf. Regular ketchup and mustard. Meat flavorings and tenderizers. Bouillon cubes. Hot sauce. Pre-made or packaged marinades. Pre-made or packaged taco seasonings. Relishes. Regular salad dressings. Salsa. Other foods Salted popcorn and pretzels. Corn chips and puffs. Potato and tortilla chips. Canned or dried soups. Pizza. Frozen entrees and pot pies. The items listed above may not be a complete list of foods and beverages you should avoid. Contact a dietitian for more information. Summary  Eating less sodium can help lower your blood pressure, reduce swelling, and protect your heart, liver, and kidneys.  Most people on this plan should limit their sodium intake to 1,500-2,000 mg (milligrams) of sodium each day.  Canned, boxed, and frozen foods are high in sodium. Restaurant foods, fast foods, and pizza are also very high in sodium. You also get sodium by adding salt to food.  Try to cook at home, eat more fresh fruits and vegetables, and eat less fast food and canned, processed, or prepared foods. This information is not intended to replace advice given to you by your health care provider. Make sure you discuss any questions you have with your health care provider. Document Revised: 10/27/2019 Document Reviewed: 08/23/2019 Elsevier Patient Education  2021 [employer].

## 2020-11-07 ENCOUNTER — Encounter: Payer: Self-pay | Admitting: Gastroenterology

## 2020-11-07 NOTE — Assessment & Plan Note (Addendum)
65 year old male with cirrhosis likely secondary to hep C and alcohol.  Per patient, hep C was treated with Harvoni at [facility].  HCVRNA not detected in April 2021.  No signs or symptoms of decompensated liver disease.  Unfortunately, patient has continued drinking alcohol.  Currently drinking a couple shots every 3 days.  History of cocaine use but none since 2020.  Last use marijuana in 2021.  Recent labs in November/December corresponding to MELD 15.  LFTs normal.  Platelets normal.  INR 1.0.  RUQ ultrasound in November 2021 with no focal liver lesions.  No prior EGD.  We have attempted this twice.  First attempt in July 2021, but patient did not follow n.p.o. instructions, so procedure was canceled.  Second attempt in November 2021, but procedure canceled due to diastolic BP 123456 mm. Today, BP is elevated at 165/100, but patient has not taken any blood pressure medications in 3 days.  Plan:  1.  I have advised patient to take his BP medications as soon as he gets home and to continue taking them as prescribed.  Also requested he follow-up with PCP on HTN. 2.  Requested patient come back to our office in 2-3 weeks for BP recheck. If BP is improved, we will proceed with EGD with propofol with Dr. Gala Romney for esophageal variceal screening.  He will need UDS. 3.  Counseled on importance of absolute alcohol cessation. 4.  Avoid all illicit drugs. 5.  2000 mg sodium diet.  6.  Monitor for swelling lower extremities or abdomen, yellowing of eyes, confusion or changes in mental status and let us know if this occurs. 7.  Follow-up in 4 months or sooner if needed.

## 2020-11-07 NOTE — Assessment & Plan Note (Addendum)
65 year old male in need of colon cancer screening.  Patient reports having colonoscopy sometime in the past at [facility].  We requested records but received fax stating no record of colonoscopy was found.  He has no significant lower GI symptoms.  No BRBPR, melena, or unintentional weight loss.  No family history of colon cancer.  Due to HTN with BP 165/100 today and prior EGD canceled due to diastolic BP 123456, we are holding off on scheduling procedures for now.  Notably, patient has not taken BP medications in 3 days.  I have advised he resume taking his BP medications as prescribed, follow-up with PCP, and return to our office in 2-3 weeks for BP recheck.  If BP is improved, we will proceed with colonoscopy with propofol with Dr. Gala Romney in the near future.  He will need a UDS at preop.

## 2020-11-07 NOTE — Assessment & Plan Note (Signed)
Episode of pancreatitis in November 2021 likely secondary to alcohol.  He reports he is much improved.  Every now and then, he experiences very mild upper abdominal pain.  No nausea or vomiting.  GERD well controlled on PPI twice daily.  Abdominal exam is benign.  Unfortunately, he has continued drinking a couple of shots every 3 days which may be contributing to his intermittent abdominal pain.   I counseled extensively on the importance of complete alcohol cessation considering history of pancreatitis as well as history of cirrhosis.  He will continue his current medications and monitor.  Notably, we are hoping to proceed with first ever EGD in the near future for esophageal variceal screening pending patient's return for BP recheck in 2-3 weeks. This will also help evaluate any residual upper abdominal pain.

## 2020-11-07 NOTE — Assessment & Plan Note (Signed)
Chronic.  Well-controlled on Protonix 40 mg twice daily.  No alarm symptoms.  Advised to continue his current medications.  Follow-up in 4 months.

## 2020-11-07 NOTE — Assessment & Plan Note (Addendum)
Resolved.  Used MiraLAX for short course and BMs returned to normal.  He is not requiring any medications at this time and is having 2-3 soft, formed BMs daily.  No alarm symptoms.  Advised to monitor for return of symptoms.

## 2020-11-11 ENCOUNTER — Ambulatory Visit
Admission: EM | Admit: 2020-11-11 | Discharge: 2020-11-11 | Disposition: A | Discharge status: Home / Self Care | Payer: Medicaid Other | Attending: Emergency Medicine | Admitting: Emergency Medicine

## 2020-11-11 ENCOUNTER — Other Ambulatory Visit: Payer: Self-pay

## 2020-11-11 ENCOUNTER — Encounter: Payer: Self-pay | Admitting: Emergency Medicine

## 2020-11-11 DIAGNOSIS — M109 Gout, unspecified: Secondary | ICD-10-CM

## 2020-11-11 MED ORDER — KETOROLAC TROMETHAMINE 60 MG/2ML IM SOLN
30.0000 mg | Freq: Once | INTRAMUSCULAR | Status: AC
  Administered 2020-11-11: 30 mg via INTRAMUSCULAR

## 2020-11-11 MED ORDER — PREDNISONE 10 MG (21) PO TBPK: ORAL_TABLET | Freq: Every day | ORAL | 0 refills | Status: DC

## 2020-11-11 MED ORDER — INDOMETHACIN 20 MG PO CAPS
1.0000 | ORAL_CAPSULE | Freq: Three times a day (TID) | ORAL | 0 refills | Status: DC | PRN
Start: 2020-11-11 — End: 2024-06-09

## 2020-11-11 MED ORDER — DEXAMETHASONE SODIUM PHOSPHATE 10 MG/ML IJ SOLN
10.0000 mg | Freq: Once | INTRAMUSCULAR | Status: AC
  Administered 2020-11-11: 10 mg via INTRAMUSCULAR

## 2020-11-11 NOTE — Discharge Instructions (Addendum)
Prescribed prednisone/take as directed and to completion Prescribed indomethecin/ take as directed with food Primary care provider assistance initiated to establish care Follow up with PCP for further evaluation and management Return or go to the ED if you have any new or worsening symptoms

## 2020-11-11 NOTE — ED Provider Notes (Signed)
[place]   [id number] 11/11/20 Arrival Time: [place]   Chief Complaint  Patient presents with  . Gout     SUBJECTIVE: History from: patient.  Joshua Maldonado is a 65 y.o. male who presented to the urgent care with a complaint of gout flare to left ankle for the past 3 days.  Denies any precipitating event, trauma or injury.  He localizes the pain to the left ankle.  He describes the pain as constant and achy.  He has tried OTC medications without relief.  His symptoms are made worse with ROM.  He denies similar symptoms in the past.  Denies chills, fever, nausea, vomiting, diarrhea.   ROS: As per HPI.  All other pertinent ROS negative.      Past Medical History:  Diagnosis Date  . Cirrhosis [facility])    Per patient, diagnosed at [facility].  Reports liver biopsy at [facility].  Suspected to be secondary to hepatitis C.and ETOH.   . Dilation of biliary tract    Dilated CBD. MRI May 2021 suggesting benign variation.  [name] GERD (gastroesophageal reflux disease)   . Gout   . Hepatitis C    Per patient, s/p treatment with Harvoni at [facility]; hep C RNA not detected April 2021.  [name] HLD (hyperlipidemia)   . Hypertension   . Sleep apnea    Past Surgical History:  Procedure Laterality Date  . BACK SURGERY    . LIVER BIOPSY     Per patient- at [facility]   No Known Allergies No current facility-administered medications on file prior to encounter.   Current Outpatient Medications on File Prior to Encounter  Medication Sig Dispense Refill  . allopurinol (ZYLOPRIM) 100 MG tablet Take 100 mg by mouth daily as needed (gout).     [name] amLODipine (NORVASC) 10 MG tablet Take 1 tablet (10 mg total) by mouth daily. (Patient not taking: Reported on 11/04/2020) 30 tablet 2  . atorvastatin (LIPITOR) 10 MG tablet Take 10 mg by mouth daily.    . DULoxetine (CYMBALTA) 30 MG capsule Take 30 mg by mouth daily.    . fluticasone (FLONASE) 50 MCG/ACT nasal spray Place 1 spray into both  nostrils daily as needed for allergies or rhinitis.    [name] gabapentin (NEURONTIN) 600 MG tablet Take 1 tablet (600 mg total) by mouth 2 (two) times daily as needed.    . hydrALAZINE (APRESOLINE) 10 MG tablet Take 1 tablet (10 mg total) by mouth every 8 (eight) hours. 90 tablet 2  . metoprolol tartrate (LOPRESSOR) 50 MG tablet Take 1 tablet (50 mg total) by mouth 2 (two) times daily. 60 tablet 2  . pantoprazole (PROTONIX) 40 MG tablet TAKE 1 TABLET (40 MG TOTAL) BY MOUTH 2 (TWO) TIMES DAILY BEFORE A MEAL. 60 tablet 3  . sucralfate (CARAFATE) 1 g tablet Take 1 tablet (1 g total) by mouth daily.     Social History   Socioeconomic History  . Marital status: Single    Spouse name: Not on file  . Number of children: Not on file  . Years of education: Not on file  . Highest education level: Not on file  Occupational History  . Not on file  Tobacco Use  . Smoking status: Current Every Day Smoker    Packs/day: 0.25    Years: 12.00    Pack years: 3.00    Types: Cigarettes  . Smokeless tobacco: Never Used  Vaping Use  . Vaping Use: Never used  Substance  and Sexual Activity  . Alcohol use: Yes    Alcohol/week: 5.0 standard drinks    Types: 2 Cans of beer, 3 Shots of liquor per week    Comment: couple shots every 3 days.   . Drug use: Not Currently    Types: Marijuana, Cocaine    Comment: Last used Marijuana in 2021. Last use cocaine in 2020.   [name] Sexual activity: Yes  Other Topics Concern  . Not on file  Social History Narrative  . Not on file   Social Determinants of Health   Financial Resource Strain: Not on file  Food Insecurity: Not on file  Transportation Needs: Not on file  Physical Activity: Not on file  Stress: Not on file  Social Connections: Not on file  Intimate Partner Violence: Not on file   Family History  Problem Relation Age of Onset  . Hypertension Father   . Colon cancer Neg Hx     OBJECTIVE:  Vitals:   11/11/20 1740  BP: (S) (!) 169/106  Pulse: 97   Resp: 20  Temp: 98.5 F (36.9 C)  TempSrc: Oral  SpO2: 96%     Physical Exam Vitals and nursing note reviewed.  Constitutional:      General: He is not in acute distress.    Appearance: Normal appearance. He is normal weight. He is not ill-appearing, toxic-appearing or diaphoretic.  Cardiovascular:     Rate and Rhythm: Normal rate and regular rhythm.     Pulses: Normal pulses.     Heart sounds: Normal heart sounds. No murmur heard. No friction rub. No gallop.   Pulmonary:     Effort: Pulmonary effort is normal. No respiratory distress.     Breath sounds: Normal breath sounds. No stridor. No wheezing, rhonchi or rales.  Chest:     Chest wall: No tenderness.  Musculoskeletal:        General: Swelling and tenderness present. No signs of injury.     Right ankle: Normal.     Left ankle: Swelling present. No deformity, ecchymosis or lacerations. Tenderness present. Decreased range of motion.  Neurological:     Mental Status: He is alert and oriented to person, place, and time.     LABS:  No results found for this or any previous visit (from the past 24 hour(s)).   ASSESSMENT & PLAN:  1. Acute gout of left ankle, unspecified cause     Meds ordered this encounter  Medications  . dexamethasone (DECADRON) injection 10 mg  . ketorolac (TORADOL) injection 30 mg  . Indomethacin 20 MG CAPS    Sig: Take 1 capsule by mouth 3 (three) times daily as needed.    Dispense:  21 capsule    Refill:  0  . predniSONE (STERAPRED UNI-PAK 21 TAB) 10 MG (21) TBPK tablet    Sig: Take by mouth daily. Take 6 tabs by mouth daily  for 1 days, then 5 tabs for 1 days, then 4 tabs for 1 days, then 3 tabs for 1 days, 2 tabs for 1 days, then 1 tab by mouth daily for 1 days    Dispense:  21 tablet    Refill:  0   Discharge Instructions  Prescribed prednisone/take as directed and to completion Prescribed indomethecin/ take as directed with food Primary care provider assistance initiated to establish  care Follow up with PCP for further evaluation and management Return or go to the ED if you have any new or worsening symptoms   Reviewed expectations  re: course of current medical issues. Questions answered. Outlined signs and symptoms indicating need for more acute intervention. Patient verbalized understanding. After Visit Summary given.         Emerson Monte, FNP 11/11/20 1828

## 2020-11-11 NOTE — ED Triage Notes (Signed)
Pt c/o poss gout flare up on left foot x3 days  Denies inj/trauma to foot  BP is 169/106 after giving pt 5 min to rest. Pt sts he's having a lot of pain and smoked before coming in.   A&O x4... NAD.[name]. ambulatory w/cane

## 2020-12-02 ENCOUNTER — Ambulatory Visit: Payer: Medicaid Other | Admitting: Neurology

## 2020-12-02 ENCOUNTER — Encounter: Payer: Self-pay | Admitting: Neurology

## 2020-12-02 VITALS — BP 150/98 | HR 77 | Ht 67.0 in | Wt 228.0 lb

## 2020-12-02 DIAGNOSIS — K7030 Alcoholic cirrhosis of liver without ascites: Secondary | ICD-10-CM

## 2020-12-02 DIAGNOSIS — F10282 Alcohol dependence with alcohol-induced sleep disorder: Secondary | ICD-10-CM

## 2020-12-02 DIAGNOSIS — I471 Supraventricular tachycardia: Secondary | ICD-10-CM | POA: Diagnosis not present

## 2020-12-02 DIAGNOSIS — G4733 Obstructive sleep apnea (adult) (pediatric): Secondary | ICD-10-CM | POA: Diagnosis not present

## 2020-12-02 DIAGNOSIS — F4321 Adjustment disorder with depressed mood: Secondary | ICD-10-CM

## 2020-12-02 NOTE — Progress Notes (Addendum)
SLEEP MEDICINE CLINIC    Provider:  Larey Seat, MD  Primary Care Physician:  Alanson Puls, [facility] [place] [address]     Referring Provider: Alanson Puls, [facility] [address] [place],  [place] [address]          Chief Complaint according to patient   Patient presents with:    . New Patient (Initial Visit)     Presents today for sleep consult. Had a SS > 5 yrs ago and was started on CPAP. He has been off CPAP for 4-5 years. Avg 4 hrs of sleep. Pt wakes up feeling tired. Has pain issues that may play a role into lack of sleep.       HISTORY OF PRESENT ILLNESS:  Joshua Maldonado is a 65 - year- old African American male patient and seen here on 12/02/2020 for a sleep consultation.  Chief concern according to patient :  "I do not know how severe my apnea was , at first I thought CPAP helped me but now I am not so sure, The patient can't answer any questions as to machine type, settings and  used it about 2 years, with a FFM.   Joshua Maldonado  has a past medical history of Cirrhosis ([place]), Dilation of biliary tract, GERD (gastroesophageal reflux disease), Gout, Hepatitis C, HLD (hyperlipidemia), Hypertension, and Sleep apnea.  He has surgical history of spine surgery, carpal tunnel .   The patient had the first sleep study in the year 2015 in [place]. it was a SPLIT study. He was unemployed at the time.    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 Family status is widowed, 2 adult children , one of them in [place], [place].  Pets are not  present. Tobacco use- none- quit 2021.  ETOH use ; "socially, Caffeine intake in form of Coffee( 2-3 / day). No Regular  exercise.     Sleep habits are as follows: The patient's dinner time is unscheduled, variable - The patient goes to bed at any time between midnight and 2 AM - having trouble to fall asleep-and continues to sleep for 3-4  hours, wakes for 2-3 bathroom breaks. Average sleep time is about 5 hours.    The preferred sleep position is sideways, with the support of 1 pillow.  Dreams are reportedly rare. 7 AM is the usual rise time.  The patient wakes up spontaneously.  He reports not feeling refreshed or restored in AM, with symptoms such as dry mouth , no morning headaches, and residual fatigue.  Naps are taken many days, lasting from 2-3 PM minutes and are more refreshing than nocturnal sleep.    Review of Systems: Out of a complete 14 system review, the patient complains of only the following symptoms, and all other reviewed systems are negative.:  Fatigue, sleepiness , snoring,  fragmented sleep, Insomnia - sleep initiation and arousals.    How likely are you to doze in the following situations: 0 = not likely, 1 = slight chance, 2 = moderate chance, 3 = high chance   Sitting and Reading? Watching Television? Sitting inactive in a public place (theater or  meeting)? As a passenger in a car for an hour without a break? Lying down in the afternoon when circumstances permit? Sitting and talking to someone? Sitting quietly after lunch without alcohol? In a car, while stopped for a few minutes in traffic?   Total =  10-14 / 24 points   FSS endorsed at 49/ 63 points.   Geriatric depression score 8/ 15 points.   Grief.   Social History   Socioeconomic History  . Marital status: Single    Spouse name: Not on file  . Number of children: Not on file  . Years of education: Not on file  . Highest education level: Not on file  Occupational History  . Not on file  Tobacco Use  . Smoking status: Current Every Day Smoker    Packs/day: 0.25    Years: 12.00    Pack years: 3.00    Types:  Cigarettes  . Smokeless tobacco: Never Used  Vaping Use  . Vaping Use: Never used  Substance and Sexual Activity  . Alcohol use: Yes    Alcohol/week: 5.0 standard drinks    Types: 2 Cans of beer, 3 Shots of liquor per week    Comment: couple shots every 3 days.   . Drug use: Not Currently    Types: Marijuana, Cocaine    Comment: Last used Marijuana in 2021. Last use cocaine in 2020.   [name] Sexual activity: Yes  Other Topics Concern  . Not on file  Social History Narrative  . Not on file   Social Determinants of Health   Financial Resource Strain: Not on file  Food Insecurity: Not on file  Transportation Needs: Not on file  Physical Activity: Not on file  Stress: Not on file  Social Connections: Not on file    Family History  Problem Relation Age of Onset  . Hypertension Father   . Colon cancer Neg Hx     Past Medical History:  Diagnosis Date  . Cirrhosis [facility])    Per patient, diagnosed at [facility].  Reports liver biopsy at [facility].  Suspected to be secondary to hepatitis C.and ETOH.   . Dilation of biliary tract    Dilated CBD. MRI May 2021 suggesting benign variation.  [name] GERD (gastroesophageal reflux disease)   . Gout   . Hepatitis C    Per patient, s/p treatment with Harvoni at [facility]; hep C RNA not detected April 2021.  [name] HLD (hyperlipidemia)   . Hypertension   . Sleep apnea     Past Surgical History:  Procedure Laterality Date  . BACK SURGERY    . LIVER BIOPSY     Per patient- at [facility]     Current Outpatient Medications on File Prior to Visit  Medication Sig Dispense Refill  . allopurinol (ZYLOPRIM) 100 MG tablet Take 100 mg by mouth daily as needed (gout).     [name] amLODipine (NORVASC) 10 MG tablet Take 1 tablet (10 mg total) by mouth daily. 30 tablet 2  . atorvastatin (LIPITOR) 10 MG tablet Take 10 mg by mouth daily.    . DULoxetine (CYMBALTA) 30 MG capsule Take 30 mg by mouth daily.    . fluticasone (FLONASE) 50 MCG/ACT nasal  spray Place 1 spray into both nostrils daily as needed for allergies or rhinitis.    [name] gabapentin (NEURONTIN) 600 MG tablet Take 1 tablet (600 mg total) by mouth 2 (two) times daily as needed.    . hydrALAZINE (APRESOLINE) 10 MG  tablet Take 1 tablet (10 mg total) by mouth every 8 (eight) hours. 90 tablet 2  . Indomethacin 20 MG CAPS Take 1 capsule by mouth 3 (three) times daily as needed. 21 capsule 0  . metoprolol tartrate (LOPRESSOR) 50 MG tablet Take 1 tablet (50 mg total) by mouth 2 (two) times daily. 60 tablet 2  . pantoprazole (PROTONIX) 40 MG tablet TAKE 1 TABLET (40 MG TOTAL) BY MOUTH 2 (TWO) TIMES DAILY BEFORE A MEAL. 60 tablet 3  . sucralfate (CARAFATE) 1 g tablet Take 1 tablet (1 g total) by mouth daily.     No current facility-administered medications on file prior to visit.    Physical exam:  Today's Vitals   12/02/20 1300  BP: (!) 150/98  Pulse: 77  Weight: 228 lb (103.4 kg)  Height: '5\' 7"'$  (1.702 m)   Body mass index is 35.71 kg/m.   Wt Readings from Last 3 Encounters:  12/02/20 228 lb (103.4 kg)  11/04/20 218 lb 9.6 oz (99.2 kg)  09/03/20 221 lb 1.9 oz (100.3 kg)     Ht Readings from Last 3 Encounters:  12/02/20 '5\' 7"'$  (1.702 m)  11/04/20 '5\' 7"'$  (1.702 m)  09/03/20 '5\' 8"'$  (1.727 m)      General: The patient is awake, alert and appears not in acute distress.  The patient is well groomed. Head: Normocephalic, atraumatic.  Neck is supple. Mallampati 3 plus ,  neck circumference:*18 inches .  Nasal airflow congested  and noisy but patent.   Retrognathia is not  seen.  Dental status: intact  Cardiovascular:  Regular rate and cardiac rhythm by pulse,  without distended neck veins. Respiratory: Lungs are clear to auscultation.  Skin:  Without evidence of ankle edema, or rash. Trunk: The patient's posture is erect.   Neurologic exam : The patient is awake and alert,but seems not quite interested in this exam and interview.  oriented to place and time.   Memory  subjective described as intact.  Attention span & concentration ability appears normal.  Speech is fluent,  without dysarthria, dysphonia or aphasia.  Mood and affect are aloof.    Cranial nerves: no loss of smell or taste reported  Pupils are equal and briskly reactive to light. Funduscopic exam deferred. Extraocular movements in vertical and horizontal planes were intact and without nystagmus. No Diplopia.blurred vision.  Visual fields by finger perimetry are intact. Hearing was intact to soft voice and finger rubbing.    Facial sensation intact to fine touch.  Facial motor strength is symmetric and tongue  Moves in  midline.  Neck ROM : rotation, tilt and flexion extension were normal for age and shoulder shrug was symmetrical.    Motor exam:  Symmetric bulk, tone and ROM.   Normal tone without cog wheeling, symmetric grip strength .   Sensory:  Fine touch and vibration were normal. Temperature was flet , too. He reports numbness in thumb and indexfingers.  Proprioception tested in the upper extremities was normal.   Coordination: Rapid alternating movements in the fingers/hands were of normal speed.  The Finger-to-nose maneuver was intact without evidence of ataxia, dysmetria and only mild tremor.   Gait and station: Patient could rise unassisted from a seated position, he walked without assistive device, then returned and got his cane. .  Stance is of wider base.   Toe and heel walk were deferred.  Deep tendon reflexes: in the upper and lower extremities are symmetric and intact.  Babinski response was deferred .  After spending a total time of 35 minutes face to face and additional time for physical and neurologic examination, review of laboratory studies,  personal review of imaging studies, reports and results of other testing and review of referral information / records as far as provided in visit, I have established the following assessments:  The patient is not  terribly interested in this examination and consultation.  The patient appears depressed,  Fatigued and a little defensive . He reports feeling isolated, lonely, lost his wife in 09-2020. The couple were together since 1974.   1)  Untreated apnea is likely still present OSA, high grade Mallampati.  2)  Fatigue may be more related to grief and depression.  3)  Alcohol and tobacco use history with recent pancreatitis.  Work-up in the medical emergency room and late late December 2021 showed hyponatremia hyperglycemia BUN 36 creatinine 1.9, lipase 292 a CT of the abdomen and pelvis without contrast showed acute pancreatitis and liver cirrhosis.  The patient was provided with IV hydration Protonix was given and morphine was used.  Abdominal distention also was evaluated by abdominal ultrasound negative for ascites.  The patient also carries a diagnosis of chronic kidney disease stage IIIb which seems to be stable on the lisinopril and hydrochlorothiazide.  He has paroxysmal SVTs and started on metoprolol 50 mg twice daily he was supposed to follow-up with cardiology outpatient as well.  He has history of gout with gouty arthritis.  Since he does no longer have a CPAP machine he could not restarted when his internist recommended so so we will reevaluate him if he has currently still apnea and to what degree.  I sent family nurse practitioner Marquita Palms,  at the [facility] clinic for this referral.   My Plan is to proceed with:  1) HST for sleep apnea screening or PSG- in a history with SVTs it may be best to invite this patient for overnight study.  2)  He fell asleep last time in a sleep lab much easier than at home.  3) we screen for central versus obstructive apnea and  Heart rhythm. He is awaiting an appointment with cardiology.   I would like to thank [facility], [facility] [place],  [place] [id number] for allowing me to meet with and to take care of this pleasant patient.   In short,  Joshua Maldonado is presenting with excessive fatigue, daytime sleepiness, depression. We will  follow up through our NP within 3-4  month.    This patient is established with [facility] neurology, he p reports, treating him for back problems. He cannot transfer this condition to his [facility] care.   CC: I will share my notes with PCP.   Electronically signed by: Larey Seat, MD 12/02/2020 1:13 PM  [facility] Board certified by The [employer] of Sleep Medicine and Diplomate of the [employer] of Sleep Medicine. Board certified In Neurology through the [employer], Fellow of the [employer] of Neurology. Medical Director of [employer].

## 2020-12-02 NOTE — Patient Instructions (Signed)
http://APA.org/depression-guideline"> https://clinicalkey.com"> http://point-of-care.elsevierperformancemanager.com/skills/"> http://point-of-care.elsevierperformancemanager.com">  Managing Depression, Adult Depression is a mental health condition that affects your thoughts, feelings, and actions. Being diagnosed with depression can bring you relief if you did not know why you have felt or behaved a certain way. It could also leave you feeling overwhelmed with uncertainty about your future. Preparing yourself to manage your symptoms can help you feel more positive about your future. How to manage lifestyle changes Managing stress Stress is your body's reaction to life changes and events, both good and bad. Stress can add to your feelings of depression. Learning to manage your stress can help lessen your feelings of depression. Try some of the following approaches to reducing your stress (stress reduction techniques):  Listen to music that you enjoy and that inspires you.  Try using a meditation app or take a meditation class.  Develop a practice that helps you connect with your spiritual self. Walk in nature, pray, or go to a place of worship.  Do some deep breathing. To do this, inhale slowly through your nose. Pause at the top of your inhale for a few seconds and then exhale slowly, letting your muscles relax.  Practice yoga to help relax and work your muscles. Choose a stress reduction technique that suits your lifestyle and personality. These techniques take time and practice to develop. Set aside 5-15 minutes a day to do them. Therapists can offer training in these techniques. Other things you can do to manage stress include:  Keeping a stress diary.  Knowing your limits and saying no when you think something is too much.  Paying attention to how you react to certain situations. You may not be able to control everything, but you can change your reaction.  Adding humor to your life by  watching funny films or TV shows.  Making time for activities that you enjoy and that relax you.   Medicines Medicines, such as antidepressants, are often a part of treatment for depression.  Talk with your pharmacist or health care provider about all the medicines, supplements, and herbal products that you take, their possible side effects, and what medicines and other products are safe to take together.  Make sure to report any side effects you may have to your health care provider. Relationships Your health care provider may suggest family therapy, couples therapy, or individual therapy as part of your treatment. How to recognize changes Everyone responds differently to treatment for depression. As you recover from depression, you may start to:  Have more interest in doing activities.  Feel less hopeless.  Have more energy.  Overeat less often, or have a better appetite.  Have better mental focus. It is important to recognize if your depression is not getting better or is getting worse. The symptoms you had in the beginning may return, such as:  Tiredness (fatigue) or low energy.  Eating too much or too little.  Sleeping too much or too little.  Feeling restless, agitated, or hopeless.  Trouble focusing or making decisions.  Unexplained physical complaints.  Feeling irritable, angry, or aggressive. If you or your family members notice these symptoms coming back, let your health care provider know right away. Follow these instructions at home: Activity  Try to get some form of exercise each day, such as walking, biking, swimming, or lifting weights.  Practice stress reduction techniques.  Engage your mind by taking a class or doing some volunteer work.   Lifestyle  Get the right amount and quality of sleep.    Cut down on using caffeine, tobacco, alcohol, and other potentially harmful substances.  Eat a healthy diet that includes plenty of vegetables, fruits,  whole grains, low-fat dairy products, and lean protein. Do not eat a lot of foods that are high in solid fats, added sugars, or salt (sodium). General instructions  Take over-the-counter and prescription medicines only as told by your health care provider.  Keep all follow-up visits as told by your health care provider. This is important. Where to find support Talking to others Friends and family members can be sources of support and guidance. Talk to trusted friends or family members about your condition. Explain your symptoms to them, and let them know that you are working with a health care provider to treat your depression. Tell friends and family members how they also can be helpful.   Finances  Find appropriate mental health providers that fit with your financial situation.  Talk with your health care provider about options to get reduced prices on your medicines. Where to find more information You can find support in your area from:  Anxiety and Depression Association of America (ADAA): www.adaa.org  Mental Health America: www.mentalhealthamerica.net  [employer] on Mental Illness: www.nami.org Contact a health care provider if:  You stop taking your antidepressant medicines, and you have any of these symptoms: ? Nausea. ? Headache. ? Light-headedness. ? Chills and body aches. ? Not being able to sleep (insomnia).  You or your friends and family think your depression is getting worse. Get help right away if:  You have thoughts of hurting yourself or others. If you ever feel like you may hurt yourself or others, or have thoughts about taking your own life, get help right away. Go to your nearest emergency department or:  Call your local emergency services (911 in the [place]  Call a suicide crisis helpline, such as the [facility] at 424-213-1625. This is open 24 hours a day in the [place]  Text the Crisis Text Line at 417-499-9279 (in the  [place].). Summary  If you are diagnosed with depression, preparing yourself to manage your symptoms is a good way to feel positive about your future.  Work with your health care provider on a management plan that includes stress reduction techniques, medicines (if applicable), therapy, and healthy lifestyle habits.  Keep talking with your health care provider about how your treatment is working.  If you have thoughts about taking your own life, call a suicide crisis helpline or text a crisis text line. This information is not intended to replace advice given to you by your health care provider. Make sure you discuss any questions you have with your health care provider. Document Revised: 08/02/2019 Document Reviewed: 08/02/2019 Elsevier Patient Education  2021 [place]. Quality Sleep Information, Adult Quality sleep is important for your mental and physical health. It also improves your quality of life. Quality sleep means you:  Are asleep for most of the time you are in bed.  Fall asleep within 30 minutes.  Wake up no more than once a night.  Are awake for no longer than 20 minutes if you do wake up during the night. Most adults need 7-8 hours of quality sleep each night. How can poor sleep affect me? If you do not get enough quality sleep, you may have:  Mood swings.  Daytime sleepiness.  Confusion.  Decreased reaction time.  Sleep disorders, such as insomnia and sleep apnea.  Difficulty with: ? Solving problems. ? Coping with  stress. ? Paying attention. These issues may affect your performance and productivity at work, school, and at home. Lack of sleep may also put you at higher risk for accidents, suicide, and risky behaviors. If you do not get quality sleep you may also be at higher risk for several health problems, including:  Infections.  Type 2 diabetes.  Heart disease.  High blood pressure.  Obesity.  Worsening of long-term conditions, like  arthritis, kidney disease, depression, Parkinson's disease, and epilepsy. What actions can I take to get more quality sleep?  Stick to a sleep schedule. Go to sleep and wake up at about the same time each day. Do not try to sleep less on weekdays and make up for lost sleep on weekends. This does not work.  Try to get about 30 minutes of exercise on most days. Do not exercise 2-3 hours before going to bed.  Limit naps during the day to 30 minutes or less.  Do not use any products that contain nicotine or tobacco, such as cigarettes or e-cigarettes. If you need help quitting, ask your health care provider.  Do not drink caffeinated beverages for at least 8 hours before going to bed. Coffee, tea, and some sodas contain caffeine.  Do not drink alcohol close to bedtime.  Do not eat large meals close to bedtime.  Do not take naps in the late afternoon.  Try to get at least 30 minutes of sunlight every day. Morning sunlight is best.  Make time to relax before bed. Reading, listening to music, or taking a hot bath promotes quality sleep.  Make your bedroom a place that promotes quality sleep. Keep your bedroom dark, quiet, and at a comfortable room temperature. Make sure your bed is comfortable. Take out sleep distractions like TV, a computer, smartphone, and bright lights.  If you are lying awake in bed for longer than 20 minutes, get up and do a relaxing activity until you feel sleepy.  Work with your health care provider to treat medical conditions that may affect sleeping, such as: ? Nasal obstruction. ? Snoring. ? Sleep apnea and other sleep disorders.  Talk to your health care provider if you think any of your prescription medicines may cause you to have difficulty falling or staying asleep.  If you have sleep problems, talk with a sleep consultant. If you think you have a sleep disorder, talk with your health care provider about getting evaluated by a specialist.      Where to  find more information  [place] website: https://sleepfoundation.org  National Heart, Lung, and [place] ([place]): http://www.saunders.info/.pdf  Centers for Disease Control and Prevention (CDC): LearningDermatology.pl Contact a health care provider if you:  Have trouble getting to sleep or staying asleep.  Often wake up very early in the morning and cannot get back to sleep.  Have daytime sleepiness.  Have daytime sleep attacks of suddenly falling asleep and sudden muscle weakness (narcolepsy).  Have a tingling sensation in your legs with a strong urge to move your legs (restless legs syndrome).  Stop breathing briefly during sleep (sleep apnea).  Think you have a sleep disorder or are taking a medicine that is affecting your quality of sleep. Summary  Most adults need 7-8 hours of quality sleep each night.  Getting enough quality sleep is an important part of health and well-being.  Make your bedroom a place that promotes quality sleep and avoid things that may cause you to have poor sleep, such as alcohol,  caffeine, smoking, and large meals.  Talk to your health care provider if you have trouble falling asleep or staying asleep. This information is not intended to replace advice given to you by your health care provider. Make sure you discuss any questions you have with your health care provider. Document Revised: 12/29/2017 Document Reviewed: 12/29/2017 Elsevier Patient Education  2021 [employer].

## 2020-12-04 ENCOUNTER — Telehealth: Payer: Self-pay

## 2020-12-04 NOTE — Telephone Encounter (Signed)
LVM for pt to call me back to schedule sleep study  

## 2021-01-06 ENCOUNTER — Ambulatory Visit (INDEPENDENT_AMBULATORY_CARE_PROVIDER_SITE_OTHER): Payer: Medicaid Other | Admitting: Neurology

## 2021-01-06 DIAGNOSIS — F4321 Adjustment disorder with depressed mood: Secondary | ICD-10-CM

## 2021-01-06 DIAGNOSIS — F10282 Alcohol dependence with alcohol-induced sleep disorder: Secondary | ICD-10-CM

## 2021-01-06 DIAGNOSIS — G4733 Obstructive sleep apnea (adult) (pediatric): Secondary | ICD-10-CM | POA: Diagnosis not present

## 2021-01-06 DIAGNOSIS — K7030 Alcoholic cirrhosis of liver without ascites: Secondary | ICD-10-CM

## 2021-01-06 DIAGNOSIS — I471 Supraventricular tachycardia: Secondary | ICD-10-CM

## 2021-01-09 NOTE — Progress Notes (Signed)
   [facility] TEST ( HST by Watch PAT)  STUDY DATA LOAD: 01-09-21  DOB: Jun 22, 1956  MRN: [id number]  ORDERING CLINICIAN: Larey Seat, MD   REFERRING CLINICIAN: [facility], [place]   CLINICAL INFORMATION/HISTORY: Mr. Joshua Maldonado a 65 - year- old African- American male patientand seen hereon 12/02/2020 for a sleep consultation.  Chiefconcernaccording to patient : "I do not know how severe my apnea was , at first I thought CPAP helped me but now I am not so sure", The patient can't answer any questions as to machine type, settings and only used it about 2 years, with a FFM.  Joshua Maldonado has a medical history of Cirrhosis ([place]), Dilation of biliary tract, GERD (gastroesophageal reflux disease), Gout, Hepatitis C, HLD (hyperlipidemia), Hypertension, and Sleep apnea. He has surgical history of spine surgery, carpal tunnel.  The patient had the first sleep study in the year 2015 in [place]- @ [place], where he underwent a SPLIT study. He was unemployed at the time.   Epworth sleepiness score: 14/24. BMI: 36.0 kg/m Neck Circumference: 18 "  FINDINGS:   Total Record Time (hours, min): 10 h 8 min  Total Sleep Time (hours, min):  8 h 56 min   Percent REM (%):    11.93 %   Calculated pAHI (per hour): 74.4      REM and NREM pAHI: 74.4 Supine AHI: 19.7   Oxygen Saturation (%) Mean: 94  Minimum oxygen saturation (%):        79   O2 Saturation Range (%): 79-99  O2Saturation (minutes) <=88% 2.5 min   Pulse Mean (bpm):    87  Pulse Range (40-135)   IMPRESSION: This HST indicates the presence of severe OSA (obstructive sleep apnea) not REM sleep dependent and with minor hypoxemia. There was brady-tachy cardia present,  Oxygen nadir at 79%.  RECOMMENDATION:  The severity of this OSA is impressive and requires immediate attention.  Given the past poor outcome of CPAP use, I would much prefer seeing Mr. Ohayon in an  attended titration study ( in lab) which allows for better mask fit and changes to BiPAP , if these should be needed.     INTERPRETING PHYSICIAN:  Larey Seat, MD   [facility] and [facility] Sleep Board certified by The [employer] of Sleep Medicine and Diplomate of the [employer] of Sleep Medicine. Board certified In Neurology through the [place], Fellow of the [employer] of Neurology. Medical Director of [employer].

## 2021-01-15 DIAGNOSIS — G4733 Obstructive sleep apnea (adult) (pediatric): Secondary | ICD-10-CM | POA: Insufficient documentation

## 2021-01-15 DIAGNOSIS — F4321 Adjustment disorder with depressed mood: Secondary | ICD-10-CM | POA: Insufficient documentation

## 2021-01-15 DIAGNOSIS — F10282 Alcohol dependence with alcohol-induced sleep disorder: Secondary | ICD-10-CM | POA: Insufficient documentation

## 2021-01-15 DIAGNOSIS — I471 Supraventricular tachycardia, unspecified: Secondary | ICD-10-CM | POA: Insufficient documentation

## 2021-01-15 NOTE — Procedures (Signed)
[facility] TEST ( HST by Watch PAT)  STUDY DATA LOAD: 01-09-21  DOB: 01-04-1956  MRN: [id number]  ORDERING CLINICIAN: Larey Seat, MD   REFERRING CLINICIAN: [facility], [place]   CLINICAL INFORMATION/HISTORY: Mr. Joshua Maldonado a 65 - year- old African- American male patientand seen hereon 12/02/2020 for a sleep consultation.  Chiefconcernaccording to patient : "I do not know how severe my apnea was , at first I thought CPAP helped me but now I am not so sure", The patient can't answer any questions as to machine type, settings and only used it about 2 years, with a FFM.  Joshua Maldonado has a medical history of Cirrhosis ([employer]), Dilation of biliary tract, GERD (gastroesophageal reflux disease), Gout, Hepatitis C, HLD (hyperlipidemia), Hypertension, and Sleep apnea. He has surgical history of spine surgery, carpal tunnel.  The patient had the first sleep study in the year 2015 in [place]- @ [place], where he underwent a SPLIT study. He was unemployed at the time.   Epworth sleepiness score: 14/24. BMI: 36.0 kg/m Neck Circumference: 18 "  FINDINGS:   Total Record Time (hours, min): 10 h 8 min  Total Sleep Time (hours, min):  8 h 56 min   Percent REM (%):    11.93 %   Calculated pAHI (per hour): 74.4      REM and NREM pAHI: 74.4 Supine AHI: 19.7   Oxygen Saturation (%) Mean: 94  Minimum oxygen saturation (%):        79   O2 Saturation Range (%): 79-99  O2Saturation (minutes) <=88% 2.5 min   Pulse Mean (bpm):    87  Pulse Range (40-135)   IMPRESSION: This HST indicates the presence of severe OSA (obstructive sleep apnea) not REM sleep dependent and with minor hypoxemia. There was brady-tachy cardia present,  Oxygen nadir at 79%.  RECOMMENDATION:  The severity of this OSA is impressive and requires immediate attention.  Given the past poor outcome of CPAP use, I would much prefer seeing [name] in an  attended titration study ( in lab) which allows for better mask fit and changes to BiPAP , if these should be needed.     INTERPRETING PHYSICIAN:  Larey Seat, MD   [facility] and [facility] Sleep Board certified by The [employer] of Sleep Medicine and Diplomate of the [employer] of Sleep Medicine. Board certified In Neurology through the [place], Fellow of the [employer] of Neurology. Medical Director of [employer].

## 2021-01-15 NOTE — Progress Notes (Signed)
Total Sleep Time (hours, min):           8 h 56 min         Percent REM (%):                               11.93 %             Calculated pAHI (per hour): 74.4          IMPRESSION: This HST indicates the presence of severe OSA (obstructive sleep apnea) not REM sleep dependent and with minor hypoxemia. There was brady-tachy cardia present,  Oxygen nadir at 79%.  RECOMMENDATION:  The severity of this OSA is impressive and requires immediate attention.  Given the past poor outcome of CPAP use, I would much prefer seeing Joshua Maldonado in an attended titration study ( in lab) which allows for better mask fit and changes to BiPAP , if these should be needed.  Cc Dr Idelle Crouch, MD , [facility]

## 2021-01-20 ENCOUNTER — Telehealth: Payer: Self-pay | Admitting: Neurology

## 2021-01-20 NOTE — Telephone Encounter (Signed)
Called patient to discuss sleep study results. No answer at this time. LVM for the patient to call back.   

## 2021-01-20 NOTE — Telephone Encounter (Signed)
-----   Message from Larey Seat, MD sent at 01/15/2021  5:08 PM EDT ----- Total Sleep Time (hours, min):           8 h 56 min         Percent REM (%):                               11.93 %             Calculated pAHI (per hour): 74.4          IMPRESSION: This HST indicates the presence of severe OSA (obstructive sleep apnea) not REM sleep dependent and with minor hypoxemia. There was brady-tachy cardia present,  Oxygen nadir at 79%.  RECOMMENDATION:  The severity of this OSA is impressive and requires immediate attention.  Given the past poor outcome of CPAP use, I would much prefer seeing Mr. Ragosta in an attended titration study ( in lab) which allows for better mask fit and changes to BiPAP , if these should be needed.  Cc Dr Idelle Crouch, MD , [facility]

## 2021-01-21 ENCOUNTER — Other Ambulatory Visit: Payer: Self-pay | Admitting: Neurology

## 2021-01-21 DIAGNOSIS — G4733 Obstructive sleep apnea (adult) (pediatric): Secondary | ICD-10-CM

## 2021-01-21 DIAGNOSIS — F10282 Alcohol dependence with alcohol-induced sleep disorder: Secondary | ICD-10-CM

## 2021-01-21 DIAGNOSIS — I471 Supraventricular tachycardia: Secondary | ICD-10-CM

## 2021-01-21 NOTE — Telephone Encounter (Signed)
I called pt. I advised pt that Dr. Brett Fairy reviewed their sleep study results and found that pt has severe sleep apnea. Dr. Brett Fairy recommends that pt complete in lab titration study but insurance denied. She would recommend pt starts auto CPAP. I reviewed PAP compliance expectations with the pt. Pt is agreeable to starting a CPAP. I advised pt that an order will be sent to a DME, [facility] and [facility] ([facility]) will call the pt within about one week after they file with the pt's insurance. [facility]) will show the pt how to use the machine, fit for masks, and troubleshoot the CPAP if needed. A follow up appt was made for insurance purposes with Ward Givens, NP on Aug 18,2022. Pt verbalized understanding to arrive 15 minutes early and bring their CPAP. A letter with all of this information in it will be mailed to the pt as a reminder. I verified with the pt that the address we have on file is correct. Pt verbalized understanding of results. Pt had no questions at this time but was encouraged to call back if questions arise. I have sent the order to [facility]) and have received confirmation that they have received the order.

## 2021-01-21 NOTE — Telephone Encounter (Signed)
Pt returned call and LVM. Please call back when available.

## 2021-02-24 IMAGING — DX DG CHEST 1V PORT
1 series · 1 of 1 positions shown · non-contrast
Comparison: None.

CLINICAL DATA: Increased shortness of breath

EXAM:
PORTABLE CHEST 1 VIEW

[chest ap]
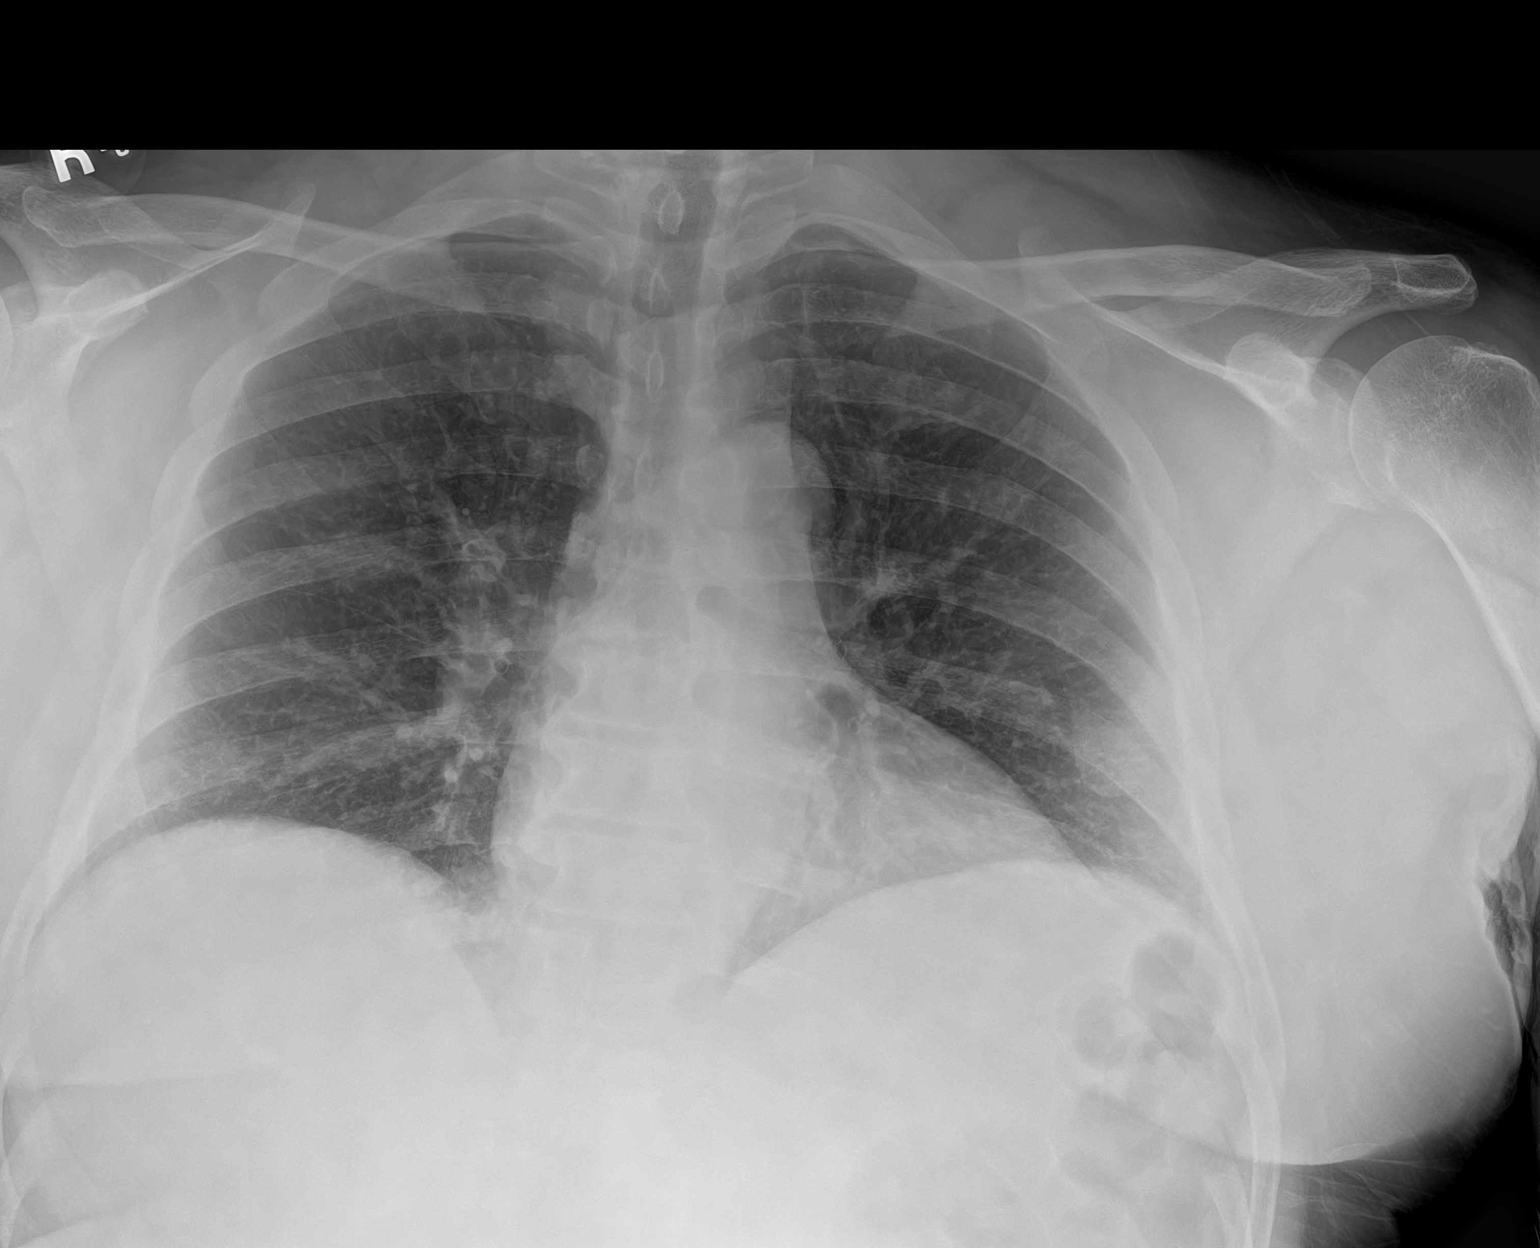

[1 of 1 positions shown; findings below may reference images not displayed]

FINDINGS: Low lung volumes. Likely chronic mild interstitial prominence. No
pleural effusion or pneumothorax. Cardiomediastinal contours are
within normal limits technique.
IMPRESSION: No acute process in the chest.

## 2021-02-26 ENCOUNTER — Ambulatory Visit: Payer: Medicaid Other | Admitting: Gastroenterology

## 2021-03-03 IMAGING — MR MR MRCP
10 of 12 series · 39 of 48 positions shown · non-contrast
Comparison: CT 01/27/2020

CLINICAL DATA: Dilatation of the common bile duct identified on CT
of the abdomen pelvis. CT performed for abdominal pain.

EXAM:
MRI ABDOMEN WITHOUT CONTRAST  (INCLUDING MRCP)
TECHNIQUE: Multiplanar multisequence MR imaging of the abdomen was performed.
Heavily T2-weighted images of the biliary and pancreatic ducts were
obtained, and three-dimensional MRCP images were rendered by post
processing.

[Series 4: ax haste · axial · 6.0mm · 1.19mm/px · z∈[-34,+189]mm · 2 of 32 slices shown]
[im 1/32]
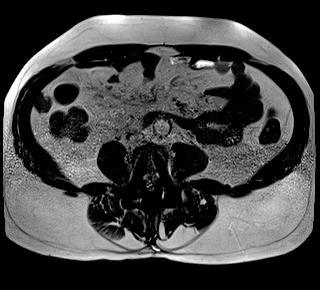
[im 32/32]
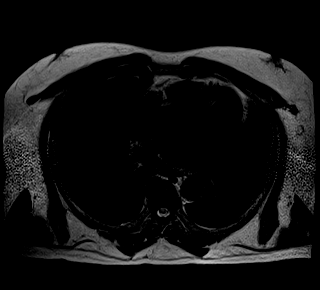

[Series 5: bSSFP · coronal · 6.0mm · 0.74mm/px · 2 of 34 slices shown]
[im 1/34]
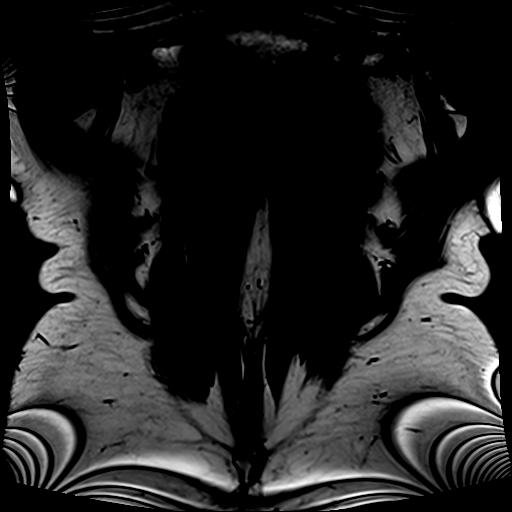
[im 34/34]
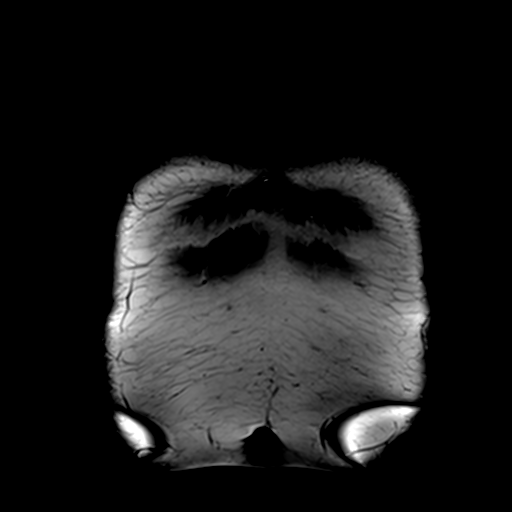

[Series 8: T2 fat-sat · axial · 6.0mm · 1.19mm/px · z∈[-34,+189]mm · 3 of 32 slices shown]
[im 1/32]
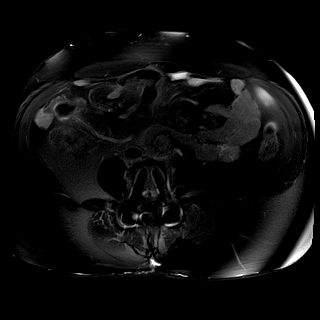
[im 16/32]
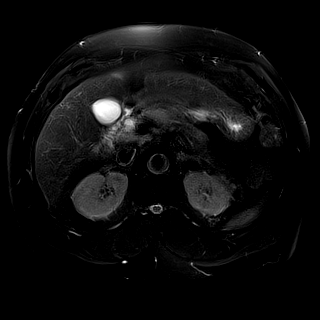
[im 32/32]
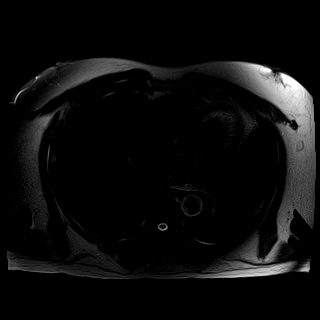

[Series 9: DWI · axial · 6.0mm · 1.42mm/px · z∈[-34,+189]mm · 8 of 96 slices shown (1 of 2)]
[im 1/96]
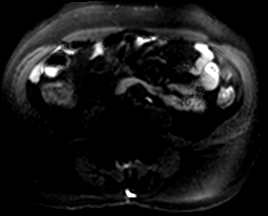
[im 14/96]
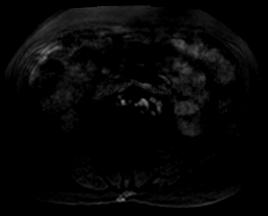
[im 28/96]
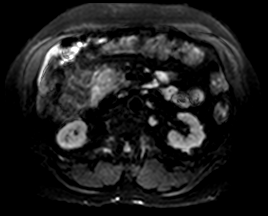
[im 41/96]
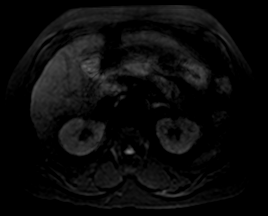
[im 55/96]
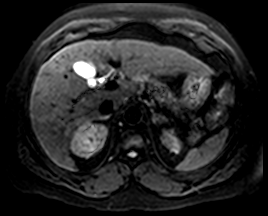
[im 68/96]
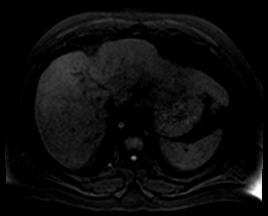
[im 82/96]
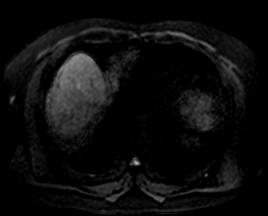
[im 96/96]
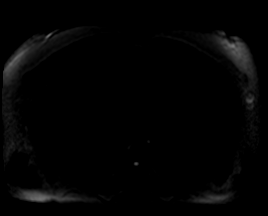

[Series 10: DWI · axial · 6.0mm · 1.42mm/px · z∈[-34,+189]mm · 3 of 32 slices shown (2 of 2)]
[im 1/32]
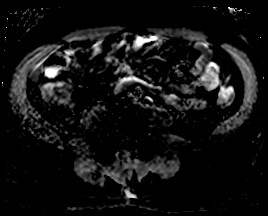
[im 16/32]
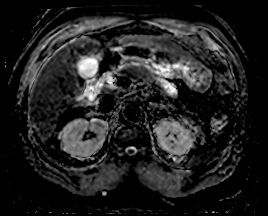
[im 32/32]
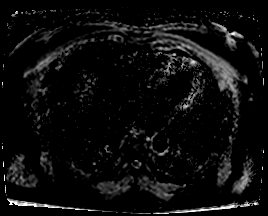

[Series 11: ax in and · axial · 3.0mm · 1.19mm/px · z∈[-29,+184]mm · 6 of 72 slices shown (1 of 2)]
[im 1/72]
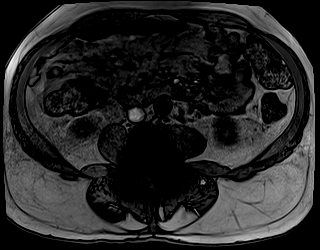
[im 15/72]
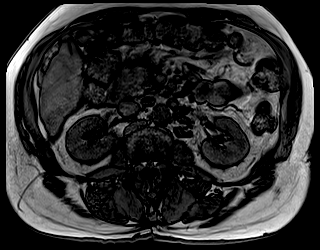
[im 29/72]
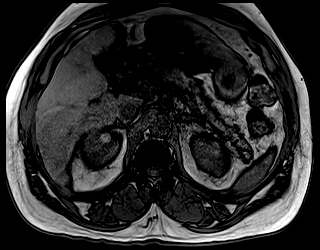
[im 43/72]
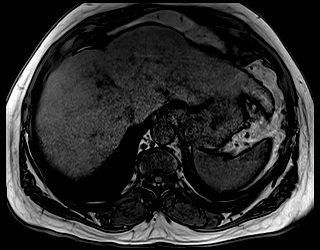
[im 57/72]
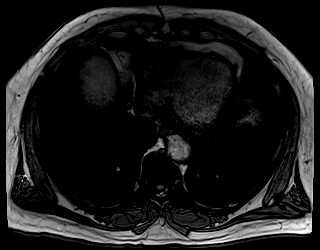
[im 72/72]
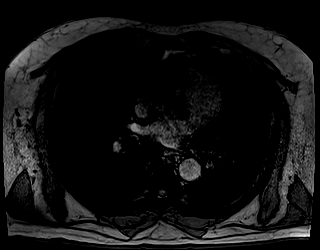

[Series 11: ax in and · axial · 3.0mm · 1.19mm/px · z∈[-29,+184]mm · 6 of 72 slices shown (2 of 2)]
[im 1/72]
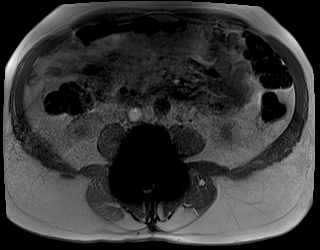
[im 15/72]
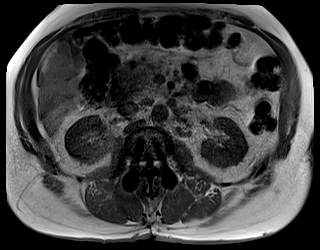
[im 29/72]
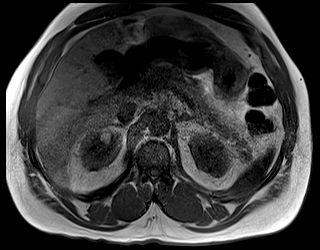
[im 43/72]
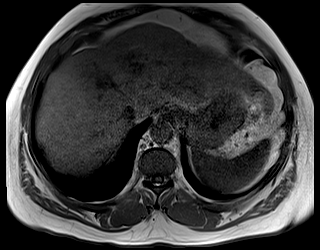
[im 57/72]
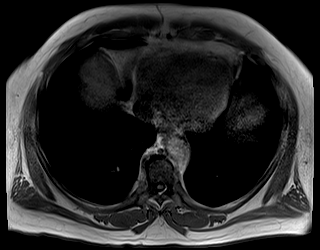
[im 72/72]
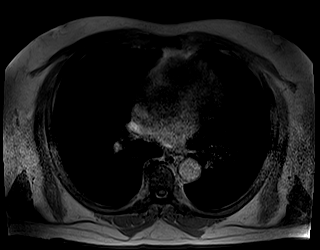

[Series 14: MRCP · coronal · 4.0mm · 1.12mm/px · 2 of 26 slices shown]
[im 1/26]
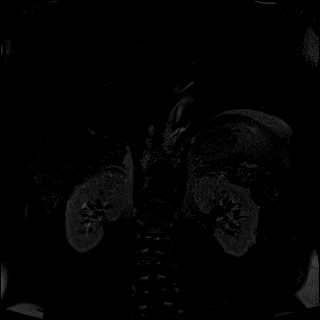
[im 26/26]
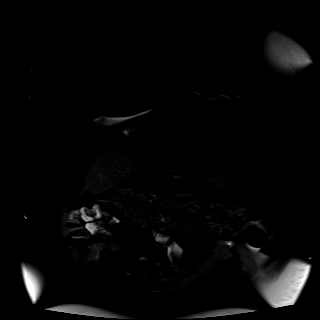

[Series 19: radials · coronal · 50.0mm · 0.78mm/px · 1 of 5 slices shown]
[im 1/5]
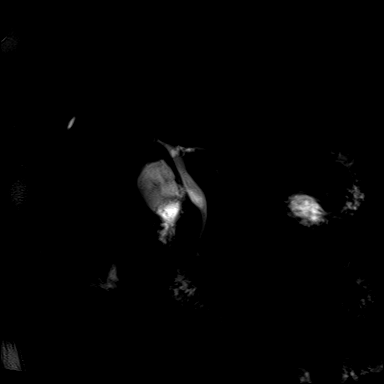

[Series 20: T1 dynamic · axial · 3.0mm · 1.19mm/px · z∈[-21,+192]mm · 6 of 72 slices shown]
[im 1/72]
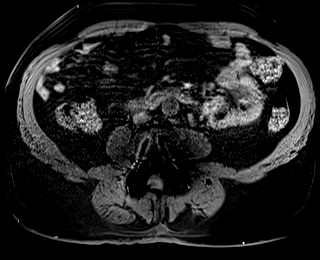
[im 15/72]
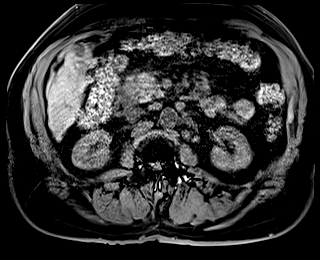
[im 29/72]
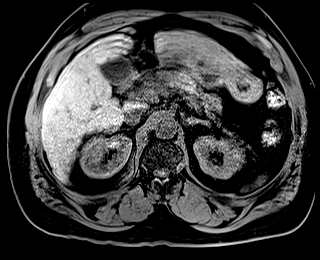
[im 43/72]
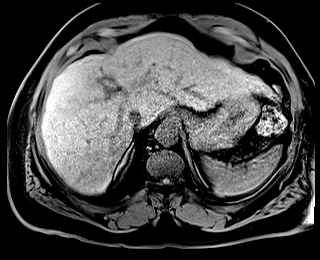
[im 57/72]
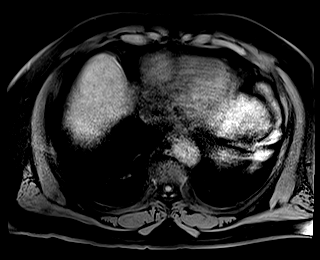
[im 72/72]
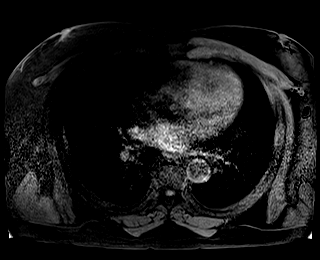

[39 of 48 positions shown; findings below may reference images not displayed]

FINDINGS: Lower chest:  Lung bases are clear.

Hepatobiliary: Liver has a lobular contour. Caudate lobe is
prominent. No ascites. No focal hepatic lesion on noncontrast exam.

Gallbladder is normal.  No gallstones noted.

The common hepatic duct and common bile duct are prominent. The
common bile duct measures 10 mm above the pancreas in tapers to 6 mm
(image 16 and image 14 of series 14). This tapering is smooth. No
filling defect within the common bile duct. No external lesion
identified on noncontrast exam.

Pancreas: No mass lesion identified in the pancreas. No IV contrast.
No pancreatic duct dilatation.

Spleen: Normal spleen.

Adrenals/urinary tract: Adrenal glands and kidneys are normal. High
intensity cyst within the RIGHT kidney measuring 9 mm on T1 weighted
imaging (image 42/20) is favored a benign hemorrhagic cyst.

Stomach/Bowel: Stomach and limited of the small bowel is
unremarkable

Vascular/Lymphatic: Abdominal aortic normal caliber. No
retroperitoneal periportal lymphadenopathy.

Musculoskeletal: No aggressive osseous lesion
IMPRESSION: 1. Smooth tapering of the common bile duct through the pancreas is
favored to represent benign variation. No obstructing lesion
identified on noncontrast exam.
2. Normal gallbladder.
3. Lobular liver consistent with cirrhosis.

## 2021-03-21 ENCOUNTER — Other Ambulatory Visit: Payer: Self-pay | Admitting: Nurse Practitioner

## 2021-03-21 DIAGNOSIS — R1010 Upper abdominal pain, unspecified: Secondary | ICD-10-CM

## 2021-04-18 NOTE — Progress Notes (Addendum)
Cardiology Office Note    Date:  04/22/2021   ID:  Joshua Maldonado, DOB [birth year], MRN [id number]   PCP:  Alanson Puls, [facility]  Cardiologist:  Rozann Lesches, MD  Advanced Practice Provider:  No care team member to display Electrophysiologist:  None   [id number]}   Chief Complaint  Patient presents with   Follow-up     History of Present Illness:  Joshua Maldonado is a 65 y.o. male w/PMH of HTN, HLD, OSA on CPAP and cirrhosis with history of Hepatitis C who was admitted for acute pancreatitis 09/2020 Cardiology consulted due to episodes of SVT. Echo normal LVEF 60-65% with moderate LVH no WMA treated with metoprolol.   Patient comes in accompanied by his daughter. Short of breath if he overexerts himself but has gained 30 lbs in the past year. Says he eats because he's bored since moving here. Doesn't notice any palpitations. Has tried to cut back on salt. Developed leg swelling after going to [place] on a train ride.right worse than left. Crt 1.76 in Dec. Had some done at PCP a couple weeks ago.    Past Medical History:  Diagnosis Date   Cirrhosis [facility])    Per patient, diagnosed at [facility].  Reports liver biopsy at [facility].  Suspected to be secondary to hepatitis C.and ETOH.    Dilation of biliary tract    Dilated CBD. MRI May 2021 suggesting benign variation.   GERD (gastroesophageal reflux disease)    Gout    Hepatitis C    Per patient, s/p treatment with Harvoni at [facility]; hep C RNA not detected April 2021.   HLD (hyperlipidemia)    Hypertension    Sleep apnea     Past Surgical History:  Procedure Laterality Date   BACK SURGERY     LIVER BIOPSY     Per patient- at [facility]    Current Medications: Current Meds  Medication Sig   allopurinol (ZYLOPRIM) 100 MG tablet Take 100 mg by mouth daily as needed (gout).    amLODipine (NORVASC) 10 MG tablet Take 1 tablet (10 mg total) by mouth daily.    atorvastatin (LIPITOR) 10 MG tablet Take 10 mg by mouth daily.   DULoxetine (CYMBALTA) 30 MG capsule Take 30 mg by mouth daily.   fluticasone (FLONASE) 50 MCG/ACT nasal spray Place 1 spray into both nostrils daily as needed for allergies or rhinitis.   furosemide (LASIX) 20 MG tablet Take 40 mg daily for 3 day then reduce to 20 mg daily   gabapentin (NEURONTIN) 600 MG tablet Take 1 tablet (600 mg total) by mouth 2 (two) times daily as needed.   hydrALAZINE (APRESOLINE) 10 MG tablet Take 1 tablet (10 mg total) by mouth every 8 (eight) hours.   Indomethacin 20 MG CAPS Take 1 capsule by mouth 3 (three) times daily as needed.   metoprolol tartrate (LOPRESSOR) 50 MG tablet Take 1 tablet (50 mg total) by mouth 2 (two) times daily.   pantoprazole (PROTONIX) 40 MG tablet TAKE 1 TABLET (40 MG TOTAL) BY MOUTH 2 (TWO) TIMES DAILY BEFORE A MEAL.   potassium chloride (KLOR-CON) 10 MEQ tablet Take 20 meq daily for 3 days then reduce to 10 meq Daily   sucralfate (CARAFATE) 1 g tablet Take 1 tablet (1 g total) by mouth daily.     Allergies:   Patient has no known allergies.   Social History   Socioeconomic History   Marital  status: Single    Spouse name: Not on file   Number of children: Not on file   Years of education: Not on file   Highest education level: Not on file  Occupational History   Not on file  Tobacco Use   Smoking status: Every Day    Packs/day: 0.25    Years: 12.00    Pack years: 3.00    Types: Cigarettes   Smokeless tobacco: Never  Vaping Use   Vaping Use: Never used  Substance and Sexual Activity   Alcohol use: Yes    Alcohol/week: 5.0 standard drinks    Types: 2 Cans of beer, 3 Shots of liquor per week    Comment: couple shots every 3 days.    Drug use: Not Currently    Types: Marijuana, Cocaine    Comment: Last used Marijuana in 2021. Last use cocaine in 2020.    Sexual activity: Yes  Other Topics Concern   Not on file  Social History Narrative   Not on file    Social Determinants of Health   Financial Resource Strain: Not on file  Food Insecurity: Not on file  Transportation Needs: Not on file  Physical Activity: Not on file  Stress: Not on file  Social Connections: Not on file     Family History:  The patient's  family history includes Hypertension in his father.   ROS:   Please see the history of present illness.    ROS All other systems reviewed and are negative.   PHYSICAL EXAM:   VS:  BP (!) 142/70   Pulse 97   Ht '5\' 7"'  (1.702 m)   Wt 227 lb (103 kg)   SpO2 97%   BMI 35.55 kg/m   Physical Exam  GEN: Obese, in no acute distress  Neck: Increased JVD, no carotid bruits, or masses Cardiac:RRR; no murmurs, rubs, or gallops  Respiratory: Decreased breath sounds with some scattered wheezes GI: soft, nontender, nondistended, + BS Ext: Bilateral leg edema but right much bigger than left Neuro:  Alert and Oriented x 3 Psych: euthymic mood, full affect  Wt Readings from Last 3 Encounters:  04/22/21 227 lb (103 kg)  12/02/20 228 lb (103.4 kg)  11/04/20 218 lb 9.6 oz (99.2 kg)      Studies/Labs Reviewed:   EKG:  EKG is not ordered today.   Recent Labs: 09/03/2020: Magnesium 1.8 09/04/2020: ALT 22 09/06/2020: Hemoglobin 15.5; Platelets 176 09/07/2020: BUN 24; Creatinine, Ser 1.76; Potassium 4.3; Sodium 134   Lipid Panel No results found for: CHOL, TRIG, HDL, CHOLHDL, VLDL, LDLCALC, LDLDIRECT  Additional studies/ records that were reviewed today include:   Echocardiogram: 09/04/2020 IMPRESSIONS     1. Left ventricular ejection fraction, by estimation, is 60 to 65%. The  left ventricle has normal function. The left ventricle has no regional  wall motion abnormalities. There is moderate left ventricular hypertrophy.  Left ventricular diastolic  parameters are indeterminate.   2. Right ventricular systolic function is normal. The right ventricular  size is normal.   3. The mitral valve is grossly normal. Trivial mitral  valve  regurgitation.   4. The aortic valve is tricuspid. Aortic valve regurgitation is not  visualized.   5. Aortic dilatation noted. There is mild dilatation of the aortic root,  measuring 39 mm.   6. The inferior vena cava is normal in size with greater than 50%  respiratory variability, suggesting right atrial pressure of 3 mmHg.  Risk Assessment/Calculations:  ASSESSMENT:    1. Paroxysmal SVT (supraventricular tachycardia) (HCC)   2. Leg swelling   3. Essential hypertension   4. Other hyperlipidemia      PLAN:  In order of problems listed above:  SVT in the setting of pancreatitis  Leg edema right greater than left with evidence of fluid overload.  Is getting extra salt in his diet.  Also traveled to [place] via train to do some painting last month.  Swelling developed during that trip.  We will check venous Dopplers of the right lower extremity to rule out DVT.  Add Lasix 40 mg once daily for 3 days then 20 mg daily K. Dur 20 mEq daily for 3 days then 10 mEq daily.  Obtain labs from PCP several weeks ago and recheck be met in 1 week.  2 g sodium diet.  2D echo 09/2020 normal LVEF with moderate LVH.  Addendum labs received from [facility] clinic 03/18/2021 creatinine was 2.22.  We will repeat be met next week and may need to hold Lasix pending next week's labs  HTN blood pressure fairly well controlled    OSA now on CPAP  Shared Decision Making/Informed Consent        Medication Adjustments/Labs and Tests Ordered: Current medicines are reviewed at length with the patient today.  Concerns regarding medicines are outlined above.  Medication changes, Labs and Tests ordered today are listed in the Patient Instructions below. Patient Instructions  Medication Instructions:  Your physician recommends that you continue on your current medications as directed. Please refer to the Current Medication list given to you today.  *If you need a refill on your cardiac  medications before your next appointment, please call your pharmacy*   Lab Work: Your physician recommends that you return for lab work in: 2 [place] ( 05/06/21)   If you have labs (blood work) drawn today and your tests are completely normal, you will receive your results only by: [place] (if you have [place]) OR A paper copy in the mail If you have any lab test that is abnormal or we need to change your treatment, we will call you to review the results.   Testing/Procedures: Your physician has requested that you have a lower or upper extremity venous duplex. This test is an ultrasound of the veins in the legs or arms. It looks at venous blood flow that carries blood from the heart to the legs or arms. Allow one hour for a Lower Venous exam. Allow thirty minutes for an Upper Venous exam. There are no restrictions or special instructions.     Follow-Up: At [facility], you and your health needs are our priority.  As part of our continuing mission to provide you with exceptional heart care, we have created designated Provider Care Teams.  These Care Teams include your primary Cardiologist (physician) and Advanced Practice Providers (APPs -  Physician Assistants and Nurse Practitioners) who all work together to provide you with the care you need, when you need it.  We recommend signing up for the patient portal called "MyChart".  Sign up information is provided on this After Visit Summary.  MyChart is used to connect with patients for Virtual Visits (Telemedicine).  Patients are able to view lab/test results, encounter notes, upcoming appointments, etc.  Non-urgent messages can be sent to your provider as well.   To learn more about what you can do with MyChart, go to [email or web address].    Your next appointment:   2 month(s)  The format for your next appointment:   In Person  Provider:   You will see one of the following Advanced Practice Providers on your designated Care  Team:   Bernerd Pho, PA-C  Ermalinda Barrios, PA-C     Other Instructions Thank you for choosing [facility]!     Sumner Boast, PA-C  04/22/2021 12:32 PM    [facility] [place], [place], [place]  [address] Phone: [phone]; Fax: [phone]

## 2021-04-22 ENCOUNTER — Ambulatory Visit (INDEPENDENT_AMBULATORY_CARE_PROVIDER_SITE_OTHER): Payer: Medicaid Other | Admitting: Physician Assistant

## 2021-04-22 ENCOUNTER — Other Ambulatory Visit: Payer: Self-pay

## 2021-04-22 ENCOUNTER — Encounter: Payer: Self-pay | Admitting: Physician Assistant

## 2021-04-22 VITALS — BP 142/70 | HR 97 | Ht 67.0 in | Wt 227.0 lb

## 2021-04-22 DIAGNOSIS — E7849 Other hyperlipidemia: Secondary | ICD-10-CM

## 2021-04-22 DIAGNOSIS — I10 Essential (primary) hypertension: Secondary | ICD-10-CM | POA: Diagnosis not present

## 2021-04-22 DIAGNOSIS — M7989 Other specified soft tissue disorders: Secondary | ICD-10-CM | POA: Diagnosis not present

## 2021-04-22 DIAGNOSIS — I471 Supraventricular tachycardia: Secondary | ICD-10-CM

## 2021-04-22 MED ORDER — FUROSEMIDE 20 MG PO TABS: ORAL_TABLET | ORAL | 3 refills | Status: DC

## 2021-04-22 MED ORDER — POTASSIUM CHLORIDE ER 10 MEQ PO TBCR: EXTENDED_RELEASE_TABLET | ORAL | 3 refills | Status: DC

## 2021-04-22 NOTE — Patient Instructions (Signed)
Medication Instructions:  Your physician recommends that you continue on your current medications as directed. Please refer to the Current Medication list given to you today.  *If you need a refill on your cardiac medications before your next appointment, please call your pharmacy*   Lab Work: Your physician recommends that you return for lab work in: 2 [place] ( 05/06/21)   If you have labs (blood work) drawn today and your tests are completely normal, you will receive your results only by: [place] (if you have [place]) OR A paper copy in the mail If you have any lab test that is abnormal or we need to change your treatment, we will call you to review the results.   Testing/Procedures: Your physician has requested that you have a lower or upper extremity venous duplex. This test is an ultrasound of the veins in the legs or arms. It looks at venous blood flow that carries blood from the heart to the legs or arms. Allow one hour for a Lower Venous exam. Allow thirty minutes for an Upper Venous exam. There are no restrictions or special instructions.     Follow-Up: At [facility], you and your health needs are our priority.  As part of our continuing mission to provide you with exceptional heart care, we have created designated Provider Care Teams.  These Care Teams include your primary Cardiologist (physician) and Advanced Practice Providers (APPs -  Physician Assistants and Nurse Practitioners) who all work together to provide you with the care you need, when you need it.  We recommend signing up for the patient portal called "MyChart".  Sign up information is provided on this After Visit Summary.  MyChart is used to connect with patients for Virtual Visits (Telemedicine).  Patients are able to view lab/test results, encounter notes, upcoming appointments, etc.  Non-urgent messages can be sent to your provider as well.   To learn more about what you can do with MyChart, go to  [email or web address].    Your next appointment:   2 month(s)  The format for your next appointment:   In Person  Provider:   You will see one of the following Advanced Practice Providers on your designated Care Team:   Bernerd Pho, PA-C  Ermalinda Barrios, PA-C     Other Instructions Thank you for choosing [facility]!

## 2021-04-25 ENCOUNTER — Ambulatory Visit (HOSPITAL_COMMUNITY)
Admission: RE | Admit: 2021-04-25 | Discharge: 2021-04-25 | Disposition: A | Discharge status: Home / Self Care | Payer: Medicaid Other | Source: Ambulatory Visit | Attending: Physician Assistant | Admitting: Physician Assistant

## 2021-04-25 ENCOUNTER — Other Ambulatory Visit: Payer: Self-pay

## 2021-04-25 DIAGNOSIS — M7989 Other specified soft tissue disorders: Secondary | ICD-10-CM | POA: Insufficient documentation

## 2021-04-29 ENCOUNTER — Other Ambulatory Visit (HOSPITAL_COMMUNITY)
Admission: RE | Admit: 2021-04-29 | Discharge: 2021-04-29 | Disposition: A | Discharge status: Home / Self Care | Payer: Medicaid Other | Source: Ambulatory Visit | Attending: Physician Assistant | Admitting: Physician Assistant

## 2021-04-29 ENCOUNTER — Other Ambulatory Visit: Payer: Self-pay

## 2021-04-29 DIAGNOSIS — I10 Essential (primary) hypertension: Secondary | ICD-10-CM | POA: Diagnosis not present

## 2021-04-29 LAB — BASIC METABOLIC PANEL
Anion gap: 7 (ref 5–15)
BUN: 22 mg/dL (ref 8–23)
CO2: 22 mmol/L (ref 22–32)
Calcium: 9 mg/dL (ref 8.9–10.3)
Chloride: 105 mmol/L (ref 98–111)
Creatinine, Ser: 2.25 mg/dL — ABNORMAL HIGH (ref 0.61–1.24)
GFR, Estimated: 32 mL/min — ABNORMAL LOW (ref >60)
Glucose, Bld: 96 mg/dL (ref 70–99)
Potassium: 4.1 mmol/L (ref 3.5–5.1)
Sodium: 134 mmol/L — ABNORMAL LOW (ref 135–145)

## 2021-04-30 ENCOUNTER — Telehealth: Payer: Self-pay

## 2021-04-30 DIAGNOSIS — Z79899 Other long term (current) drug therapy: Secondary | ICD-10-CM

## 2021-04-30 NOTE — Telephone Encounter (Signed)
Pt notified of results. Pt verified that he only takes 20 mg tablet daily. Pt agreeable to lab work in 1 week.

## 2021-04-30 NOTE — Telephone Encounter (Signed)
-----   Message from Theora Gianotti, NP sent at 04/29/2021  4:17 PM EDT ----- Kidney function is more abnormal than usual - likely secondary to recent lasix burst.  Please make sure that he is only taking '20mg'$  daily now, and f/u bmet in a week.

## 2021-05-13 NOTE — Progress Notes (Signed)
Cardiology Office Note    Date:  05/26/2021   ID:  Joshua Maldonado, DOB 1956/01/22, MRN [id number]   PCP:  [facility]  Cardiologist:  Rozann Lesches, MD   Advanced Practice Provider:  No care team member to display Electrophysiologist:  None   [id number]}   No chief complaint on file.   History of Present Illness:  Joshua Maldonado is a 65 y.o. male  w/PMH of HTN, HLD, OSA on CPAP and cirrhosis with history of Hepatitis C who was admitted for acute pancreatitis 09/2020 Cardiology consulted due to episodes of SVT. Echo normal LVEF 60-65% with moderate LVH no WMA treated with metoprolol.    I saw the patient 04/22/2021 with leg edema and I increased Lasix to 40 mg daily for 3 days then 20 mg daily.  Also ordered Dopplers to rule out DVT because of a long train ride, this was negative creatinine 03/18/2021 was 2.22.  Creatinine 2.25 on 04/29/2021 but went up to 2.69 05/20/21.  Patient comes in for f/u. Has lost 6 lbs. Watching his diet closer. Not eating as much. Still swelling but has gone down some. Was in ED with gout and given hydrocodone. Still having a lot of ankle pain. Used to take indocin but not given with renal function. Has some chest congestions-coughing yellow at times.    Past Medical History:  Diagnosis Date   Cirrhosis [facility])    Per patient, diagnosed at [facility].  Reports liver biopsy at [facility].  Suspected to be secondary to hepatitis C.and ETOH.    Dilation of biliary tract    Dilated CBD. MRI May 2021 suggesting benign variation.   GERD (gastroesophageal reflux disease)    Gout    Hepatitis C    Per patient, s/p treatment with Harvoni at [facility]; hep C RNA not detected April 2021.   HLD (hyperlipidemia)    Hypertension    Sleep apnea     Past Surgical History:  Procedure Laterality Date   BACK SURGERY     LIVER BIOPSY     Per patient- at [facility]    Current Medications: Current Meds   Medication Sig   allopurinol (ZYLOPRIM) 100 MG tablet Take 100 mg by mouth daily as needed (gout).    amLODipine (NORVASC) 10 MG tablet Take 1 tablet (10 mg total) by mouth daily.   atorvastatin (LIPITOR) 10 MG tablet Take 10 mg by mouth daily.   colchicine 0.6 MG tablet Take 1 tablet (0.6 mg total) by mouth daily.   DULoxetine (CYMBALTA) 30 MG capsule Take 30 mg by mouth daily.   fluticasone (FLONASE) 50 MCG/ACT nasal spray Place 1 spray into both nostrils daily as needed for allergies or rhinitis.   furosemide (LASIX) 20 MG tablet Take 40 mg daily for 3 day then reduce to 20 mg daily   gabapentin (NEURONTIN) 600 MG tablet Take 1 tablet (600 mg total) by mouth 2 (two) times daily as needed.   hydrALAZINE (APRESOLINE) 10 MG tablet Take 1 tablet (10 mg total) by mouth every 8 (eight) hours.   HYDROcodone-acetaminophen (NORCO/VICODIN) 5-325 MG tablet Take 1 tablet by mouth every 6 (six) hours as needed.   Indomethacin 20 MG CAPS Take 1 capsule by mouth 3 (three) times daily as needed.   pantoprazole (PROTONIX) 40 MG tablet TAKE 1 TABLET (40 MG TOTAL) BY MOUTH 2 (TWO) TIMES DAILY BEFORE A MEAL.   potassium chloride (KLOR-CON) 10 MEQ tablet  Take 20 meq daily for 3 days then reduce to 10 meq Daily   sucralfate (CARAFATE) 1 g tablet Take 1 tablet (1 g total) by mouth daily.   [DISCONTINUED] metoprolol tartrate (LOPRESSOR) 50 MG tablet Take 1 tablet (50 mg total) by mouth 2 (two) times daily.     Allergies:   Patient has no known allergies.   Social History   Socioeconomic History   Marital status: Single    Spouse name: Not on file   Number of children: Not on file   Years of education: Not on file   Highest education level: Not on file  Occupational History   Not on file  Tobacco Use   Smoking status: Every Day    Packs/day: 0.25    Years: 12.00    Pack years: 3.00    Types: Cigarettes   Smokeless tobacco: Never  Vaping Use   Vaping Use: Never used  Substance and Sexual Activity    Alcohol use: Yes    Alcohol/week: 5.0 standard drinks    Types: 2 Cans of beer, 3 Shots of liquor per week    Comment: couple shots every 3 days.    Drug use: Not Currently    Types: Marijuana, Cocaine    Comment: Last used Marijuana in 2021. Last use cocaine in 2020.    Sexual activity: Yes  Other Topics Concern   Not on file  Social History Narrative   Not on file   Social Determinants of Health   Financial Resource Strain: Not on file  Food Insecurity: Not on file  Transportation Needs: Not on file  Physical Activity: Not on file  Stress: Not on file  Social Connections: Not on file     Family History:  The patient's  family history includes Hypertension in his father.   ROS:   Please see the history of present illness.    ROS All other systems reviewed and are negative.   PHYSICAL EXAM:   VS:  BP (!) 138/92   Pulse 87   Ht _0  (1.702 m)   Wt 221 lb (100.2 kg)   SpO2 97%   BMI 34.61 kg/m   Physical Exam  GEN: Obese, in no acute distress  Neck: no JVD, carotid bruits, or masses Cardiac:RRR; no murmurs, rubs, or gallops  Respiratory:  decreased breath sounds but clear to auscultation bilaterally, normal work of breathing GI: soft, nontender, nondistended, + BS Ext: ankle edema L >R, Good distal pulses bilaterally Neuro:  Alert and Oriented x 3 Psych: euthymic mood, full affect  Wt Readings from Last 3 Encounters:  05/26/21 221 lb (100.2 kg)  05/20/21 230 lb (104.3 kg)  04/22/21 227 lb (103 kg)      Studies/Labs Reviewed:   EKG:  EKG is not ordered today.    Recent Labs: 09/03/2020: Magnesium 1.8 09/04/2020: ALT 22 05/20/2021: BUN 31; Creatinine, Ser 2.69; Hemoglobin 15.0; Platelets 149; Potassium 4.0; Sodium 133   Lipid Panel No results found for: CHOL, TRIG, HDL, CHOLHDL, VLDL, LDLCALC, LDLDIRECT  Additional studies/ records that were reviewed today include:  Lower extremity venous Doppler 04/25/2021 IMPRESSION: No evidence of DVT within  either lower extremity.     Electronically Signed   By: Sandi Mariscal M.D.   On: 04/25/2021 15:04    Echocardiogram: 09/04/2020 IMPRESSIONS     1. Left ventricular ejection fraction, by estimation, is 60 to 65%. The  left ventricle has normal function. The left ventricle has no regional  wall motion  abnormalities. There is moderate left ventricular hypertrophy.  Left ventricular diastolic  parameters are indeterminate.   2. Right ventricular systolic function is normal. The right ventricular  size is normal.   3. The mitral valve is grossly normal. Trivial mitral valve  regurgitation.   4. The aortic valve is tricuspid. Aortic valve regurgitation is not  visualized.   5. Aortic dilatation noted. There is mild dilatation of the aortic root,  measuring 39 mm.   6. The inferior vena cava is normal in size with greater than 50%  respiratory variability, suggesting right atrial pressure of 3 mmHg.   Risk Assessment/Calculations:         ASSESSMENT:    1. Paroxysmal SVT (supraventricular tachycardia) (HCC)   2. Leg swelling   3. Essential hypertension   4. OSA on CPAP   5. Acute idiopathic gout, unspecified site      PLAN:  In order of problems listed above:  SVT in the setting of pancreatitis no recurrence has been out of metoprolol for 2 months but did not tell us last office visit.  We will refill metoprolol.  Lower extremity edema with fluid overload Echo 09/2020 EF 60 to 65% with moderate LVH extra Lasix given last office visit but creatinine up to 2.25 04/29/21 and 2.69 05/20/21.  We will check be met today.  Hypertension blood pressure up but having pain from gout.  And also a lot of metoprolol.  We will refill.  OSA on CPAP  Acute gout.  He is on allopurinol and was given hydrocodone/acetaminophen in the ER.  He used to take indomethacin but with rising creatinine not advised.  We will give him colchicine and asked him to follow-up with PCP for this.  Shared  Decision Making/Informed Consent        Medication Adjustments/Labs and Tests Ordered: Current medicines are reviewed at length with the patient today.  Concerns regarding medicines are outlined above.  Medication changes, Labs and Tests ordered today are listed in the Patient Instructions below. Patient Instructions  Medication Instructions:  Your physician has recommended you make the following change in your medication:  START Colchicine 0.6 mg tablets daily  *If you need a refill on your cardiac medications before your next appointment, please call your pharmacy*   Lab Work: BMET If you have labs (blood work) drawn today and your tests are completely normal, you will receive your results only by: [place] (if you have MyChart) OR A paper copy in the mail If you have any lab test that is abnormal or we need to change your treatment, we will call you to review the results.   Testing/Procedures: None   Follow-Up: At [facility], you and your health needs are our priority.  As part of our continuing mission to provide you with exceptional heart care, we have created designated Provider Care Teams.  These Care Teams include your primary Cardiologist (physician) and Advanced Practice Providers (APPs -  Physician Assistants and Nurse Practitioners) who all work together to provide you with the care you need, when you need it.  We recommend signing up for the patient portal called "MyChart".  Sign up information is provided on this After Visit Summary.  MyChart is used to connect with patients for Virtual Visits (Telemedicine).  Patients are able to view lab/test results, encounter notes, upcoming appointments, etc.  Non-urgent messages can be sent to your provider as well.   To learn more about what you can do with MyChart, go  to [email or web address].    Your next appointment:   1 month(s)  The format for your next appointment:   In Person  Provider:   You may see  Rozann Lesches, MD or one of the following Advanced Practice Providers on your designated Care Team:   Ermalinda Barrios, PA-C    Other Instructions     Signed, Ermalinda Barrios, PA-C  05/26/2021 1:35 PM    [facility] [place], [place], [place]  [address] Phone: [phone]; Fax: [phone]

## 2021-05-20 ENCOUNTER — Encounter (HOSPITAL_COMMUNITY): Payer: Self-pay | Admitting: Emergency Medicine

## 2021-05-20 ENCOUNTER — Other Ambulatory Visit: Payer: Self-pay

## 2021-05-20 ENCOUNTER — Emergency Department (HOSPITAL_COMMUNITY): Payer: Medicaid Other

## 2021-05-20 ENCOUNTER — Emergency Department (HOSPITAL_COMMUNITY)
Admission: EM | Admit: 2021-05-20 | Discharge: 2021-05-20 | Disposition: A | Discharge status: Home / Self Care | Payer: Medicaid Other | Attending: Emergency Medicine | Admitting: Emergency Medicine

## 2021-05-20 DIAGNOSIS — Z79899 Other long term (current) drug therapy: Secondary | ICD-10-CM | POA: Insufficient documentation

## 2021-05-20 DIAGNOSIS — M25571 Pain in right ankle and joints of right foot: Secondary | ICD-10-CM | POA: Diagnosis not present

## 2021-05-20 DIAGNOSIS — I129 Hypertensive chronic kidney disease with stage 1 through stage 4 chronic kidney disease, or unspecified chronic kidney disease: Secondary | ICD-10-CM | POA: Diagnosis not present

## 2021-05-20 DIAGNOSIS — M79671 Pain in right foot: Secondary | ICD-10-CM | POA: Diagnosis present

## 2021-05-20 DIAGNOSIS — N189 Chronic kidney disease, unspecified: Secondary | ICD-10-CM | POA: Insufficient documentation

## 2021-05-20 DIAGNOSIS — F17210 Nicotine dependence, cigarettes, uncomplicated: Secondary | ICD-10-CM | POA: Diagnosis not present

## 2021-05-20 LAB — CBC WITH DIFFERENTIAL/PLATELET
Abs Immature Granulocytes: 0.03 10*3/uL (ref 0.00–0.07)
Basophils Absolute: 0 10*3/uL (ref 0.0–0.1)
Basophils Relative: 0 %
Eosinophils Absolute: 0 10*3/uL (ref 0.0–0.5)
Eosinophils Relative: 1 %
HCT: 45.2 % (ref 39.0–52.0)
Hemoglobin: 15 g/dL (ref 13.0–17.0)
Immature Granulocytes: 0 %
Lymphocytes Relative: 12 %
Lymphs Abs: 0.9 10*3/uL (ref 0.7–4.0)
MCH: 30.7 pg (ref 26.0–34.0)
MCHC: 33.2 g/dL (ref 30.0–36.0)
MCV: 92.4 fL (ref 80.0–100.0)
Monocytes Absolute: 0.9 10*3/uL (ref 0.1–1.0)
Monocytes Relative: 12 %
Neutro Abs: 5.7 10*3/uL (ref 1.7–7.7)
Neutrophils Relative %: 75 %
Platelets: 149 10*3/uL — ABNORMAL LOW (ref 150–400)
RBC: 4.89 MIL/uL (ref 4.22–5.81)
RDW: 12.7 % (ref 11.5–15.5)
WBC: 7.6 10*3/uL (ref 4.0–10.5)
nRBC: 0 % (ref 0.0–0.2)

## 2021-05-20 LAB — BASIC METABOLIC PANEL
Anion gap: 7 (ref 5–15)
BUN: 31 mg/dL — ABNORMAL HIGH (ref 8–23)
CO2: 21 mmol/L — ABNORMAL LOW (ref 22–32)
Calcium: 9 mg/dL (ref 8.9–10.3)
Chloride: 105 mmol/L (ref 98–111)
Creatinine, Ser: 2.69 mg/dL — ABNORMAL HIGH (ref 0.61–1.24)
GFR, Estimated: 26 mL/min — ABNORMAL LOW (ref >60)
Glucose, Bld: 127 mg/dL — ABNORMAL HIGH (ref 70–99)
Potassium: 4 mmol/L (ref 3.5–5.1)
Sodium: 133 mmol/L — ABNORMAL LOW (ref 135–145)

## 2021-05-20 LAB — URIC ACID: Uric Acid, Serum: 8.2 mg/dL (ref 3.7–8.6)

## 2021-05-20 MED ORDER — HYDROMORPHONE HCL 1 MG/ML IJ SOLN
1.0000 mg | Freq: Once | INTRAMUSCULAR | Status: AC
  Administered 2021-05-20: 1 mg via INTRAMUSCULAR
  Filled 2021-05-20: qty 1

## 2021-05-20 MED ORDER — HYDROCODONE-ACETAMINOPHEN 5-325 MG PO TABS: 1.0000 | ORAL_TABLET | Freq: Four times a day (QID) | ORAL | 0 refills | Status: DC | PRN

## 2021-05-20 NOTE — Discharge Instructions (Addendum)
Follow-up with your family doctor next week for recheck. 

## 2021-05-20 NOTE — ED Triage Notes (Signed)
Pt c/o left foot pain since Sunday. Pt denies any known injury. Pt states hx of gout.

## 2021-05-20 NOTE — ED Notes (Signed)
Pedal pulses noted to bilateral feet.  Pt c/o left foot pain.  Swelling noted to left foot and ankle.

## 2021-05-20 NOTE — ED Provider Notes (Signed)
[facility] EMERGENCY DEPARTMENT Provider Note   CSN: [id number] Arrival date & time: 05/20/21  1057     History Chief Complaint  Patient presents with   Foot Pain    Joshua Maldonado is a 65 y.o. male.  Patient complains of right ankle pain.  Patient has history of gout  The history is provided by the patient and medical records. No language interpreter was used.  Foot Pain This is a new problem. The current episode started more than 2 days ago. The problem occurs constantly. The problem has not changed since onset.Pertinent negatives include no chest pain, no abdominal pain and no headaches. Nothing aggravates the symptoms. Nothing relieves the symptoms. He has tried nothing for the symptoms.      Past Medical History:  Diagnosis Date   Cirrhosis [facility])    Per patient, diagnosed at [facility].  Reports liver biopsy at [facility].  Suspected to be secondary to hepatitis C.and ETOH.    Dilation of biliary tract    Dilated CBD. MRI May 2021 suggesting benign variation.   GERD (gastroesophageal reflux disease)    Gout    Hepatitis C    Per patient, s/p treatment with Harvoni at [facility]; hep C RNA not detected April 2021.   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/15/2021   Severe obstructive sleep apnea-hypopnea syndrome 01/15/2021   Paroxysmal SVT (supraventricular tachycardia) ([place]) 01/15/2021   Grief reaction 01/15/2021   History of pancreatitis 11/04/2020   GERD (gastroesophageal reflux disease) 11/04/2020   Colon cancer screening 11/04/2020   Acute pancreatitis 09/02/2020   Essential hypertension 09/02/2020   Hyperlipidemia 09/02/2020   Acute kidney injury superimposed on CKD ([place]) 09/02/2020   Hyperglycemia 09/02/2020   Elevated lipase 09/02/2020   History of hepatitis C 01/31/2020   Cirrhosis of liver without  ascites ([place]) 01/31/2020   Dilation of biliary tract 01/31/2020   Abdominal pain 01/31/2020   Constipation 01/31/2020    Past Surgical History:  Procedure Laterality Date   BACK SURGERY     LIVER BIOPSY     Per patient- at [facility]       Family History  Problem Relation Age of Onset   Hypertension Father    Colon cancer Neg Hx     Social History   Tobacco Use   Smoking status: Every Day    Packs/day: 0.25    Years: 12.00    Pack years: 3.00    Types: Cigarettes   Smokeless tobacco: Never  Vaping Use   Vaping Use: Never used  Substance Use Topics   Alcohol use: Yes    Alcohol/week: 5.0 standard drinks    Types: 2 Cans of beer, 3 Shots of liquor per week    Comment: couple shots every 3 days.    Drug use: Not Currently    Types: Marijuana, Cocaine    Comment: Last used Marijuana in 2021. Last use cocaine in 2020.     Home Medications Prior to Admission medications   Medication Sig Start Date End Date Taking? Authorizing Provider  HYDROcodone-acetaminophen (NORCO/VICODIN) 5-325 MG tablet Take 1 tablet by mouth every 6 (six) hours as needed. 05/20/21  Yes Milton Ferguson, MD  allopurinol (ZYLOPRIM) 100 MG tablet Take 100 mg by mouth daily as needed (gout).  04/05/20   [provider]  amLODipine (NORVASC) 10 MG tablet Take 1 tablet (10 mg total) by mouth daily. 09/07/20   Johnson, Clanford L, MD  atorvastatin (LIPITOR) 10 MG tablet Take 10 mg by mouth daily.    [provider]  DULoxetine (CYMBALTA) 30 MG capsule Take 30 mg by mouth daily. 07/12/20   [provider]  fluticasone (FLONASE) 50 MCG/ACT nasal spray Place 1 spray into both nostrils daily as needed for allergies or rhinitis.    [provider]  furosemide (LASIX) 20 MG tablet Take 40 mg daily for 3 day then reduce to 20 mg daily 04/22/21   Imogene Burn, PA-C  gabapentin (NEURONTIN) 600 MG tablet Take 1 tablet (600 mg total) by mouth 2 (two) times daily as needed. 09/07/20    Johnson, Clanford L, MD  hydrALAZINE (APRESOLINE) 10 MG tablet Take 1 tablet (10 mg total) by mouth every 8 (eight) hours. 09/07/20   Johnson, Clanford L, MD  Indomethacin 20 MG CAPS Take 1 capsule by mouth 3 (three) times daily as needed. 11/11/20   Avegno, Darrelyn Hillock, FNP  metoprolol tartrate (LOPRESSOR) 50 MG tablet Take 1 tablet (50 mg total) by mouth 2 (two) times daily. 09/07/20   Johnson, Clanford L, MD  pantoprazole (PROTONIX) 40 MG tablet TAKE 1 TABLET (40 MG TOTAL) BY MOUTH 2 (TWO) TIMES DAILY BEFORE A MEAL. 03/27/21 03/27/22  Annitta Needs, NP  potassium chloride (KLOR-CON) 10 MEQ tablet Take 20 meq daily for 3 days then reduce to 10 meq Daily 04/22/21   Imogene Burn, PA-C  sucralfate (CARAFATE) 1 g tablet Take 1 tablet (1 g total) by mouth daily. 09/07/20 09/07/21  Murlean Iba, MD    Allergies    Patient has no known allergies.  Review of Systems   Review of Systems  Constitutional:  Negative for appetite change and fatigue.  HENT:  Negative for congestion, ear discharge and sinus pressure.   Eyes:  Negative for discharge.  Respiratory:  Negative for cough.   Cardiovascular:  Negative for chest pain.  Gastrointestinal:  Negative for abdominal pain and diarrhea.  Genitourinary:  Negative for frequency and hematuria.  Musculoskeletal:  Negative for back pain.       Right foot pain  Skin:  Negative for rash.  Neurological:  Negative for seizures and headaches.  Psychiatric/Behavioral:  Negative for hallucinations.    Physical Exam Updated Vital Signs BP (!) 142/91 (BP Location: Right Arm)   Pulse 96   Temp 99 F (37.2 C) (Tympanic)   Resp 18   Ht '5\' 7"'$  (1.702 m)   Wt 104.3 kg   SpO2 97%   BMI 36.02 kg/m   Physical Exam Vitals and nursing note reviewed.  Constitutional:      Appearance: He is well-developed.  HENT:     Head: Normocephalic.     Nose: Nose normal.  Eyes:     General: No scleral icterus.    Conjunctiva/sclera: Conjunctivae normal.  Neck:      Thyroid: No thyromegaly.  Cardiovascular:     Rate and Rhythm: Normal rate and regular rhythm.     Heart sounds: No murmur heard.   No friction rub. No gallop.  Pulmonary:     Breath sounds: No stridor. No wheezing or rales.  Chest:     Chest wall: No tenderness.  Abdominal:     General: There is no distension.     Tenderness: There is no abdominal tenderness. There is no rebound.  Musculoskeletal:  Cervical back: Neck supple.     Comments: Tender right foot  Lymphadenopathy:     Cervical: No cervical adenopathy.  Skin:    Findings: No erythema or rash.  Neurological:     Mental Status: He is alert and oriented to person, place, and time.     Motor: No abnormal muscle tone.     Coordination: Coordination normal.  Psychiatric:        Behavior: Behavior normal.    ED Results / Procedures / Treatments   Labs (all labs ordered are listed, but only abnormal results are displayed) Labs Reviewed  CBC WITH DIFFERENTIAL/PLATELET - Abnormal; Notable for the following components:      Result Value   Platelets 149 (*)    All other components within normal limits  BASIC METABOLIC PANEL - Abnormal; Notable for the following components:   Sodium 133 (*)    CO2 21 (*)    Glucose, Bld 127 (*)    BUN 31 (*)    Creatinine, Ser 2.69 (*)    GFR, Estimated 26 (*)    All other components within normal limits  URIC ACID    EKG None  Radiology DG Foot Complete Left  Result Date: 05/20/2021 CLINICAL DATA:  Left foot pain.  No injury. EXAM: LEFT FOOT - COMPLETE 3+ VIEW COMPARISON:  None. FINDINGS: There is no evidence of fracture or dislocation. There is no evidence of arthropathy or other focal bone abnormality. Soft tissues are unremarkable. IMPRESSION: Negative. Electronically Signed   By: Titus Dubin M.D.   On: 05/20/2021 11:54    Procedures Procedures   Medications Ordered in ED Medications  HYDROmorphone (DILAUDID) injection 1 mg (1 mg Intramuscular Given 05/20/21 1302)     ED Course  I have reviewed the triage vital signs and the nursing notes.  Pertinent labs & imaging results that were available during my care of the patient were reviewed by me and considered in my medical decision making (see chart for details).    MDM Rules/Calculators/A&P                           Patient with right ankle pain and swelling.  Patient will be given some hydrocodone.  He will not be given nonsteroidals because of his kidney function Final Clinical Impression(s) / ED Diagnoses Final diagnoses:  Foot pain, right    Rx / DC Orders ED Discharge Orders          Ordered    HYDROcodone-acetaminophen (NORCO/VICODIN) 5-325 MG tablet  Every 6 hours PRN        05/20/21 1544             Milton Ferguson, MD 05/21/21 [phone]

## 2021-05-22 ENCOUNTER — Ambulatory Visit: Payer: Medicaid Other | Admitting: Adult Health

## 2021-05-26 ENCOUNTER — Other Ambulatory Visit: Payer: Self-pay

## 2021-05-26 ENCOUNTER — Encounter: Payer: Self-pay | Admitting: Physician Assistant

## 2021-05-26 ENCOUNTER — Other Ambulatory Visit (HOSPITAL_COMMUNITY)
Admission: RE | Admit: 2021-05-26 | Discharge: 2021-05-26 | Disposition: A | Discharge status: Home / Self Care | Payer: Medicaid Other | Source: Ambulatory Visit | Attending: Physician Assistant | Admitting: Physician Assistant

## 2021-05-26 ENCOUNTER — Other Ambulatory Visit: Payer: Self-pay | Admitting: Physician Assistant

## 2021-05-26 ENCOUNTER — Ambulatory Visit: Payer: Medicaid Other | Admitting: Physician Assistant

## 2021-05-26 VITALS — BP 138/92 | HR 87 | Ht 67.0 in | Wt 221.0 lb

## 2021-05-26 DIAGNOSIS — I471 Supraventricular tachycardia: Secondary | ICD-10-CM | POA: Diagnosis not present

## 2021-05-26 DIAGNOSIS — M7989 Other specified soft tissue disorders: Secondary | ICD-10-CM | POA: Diagnosis not present

## 2021-05-26 DIAGNOSIS — G4733 Obstructive sleep apnea (adult) (pediatric): Secondary | ICD-10-CM | POA: Diagnosis not present

## 2021-05-26 DIAGNOSIS — I10 Essential (primary) hypertension: Secondary | ICD-10-CM | POA: Diagnosis not present

## 2021-05-26 DIAGNOSIS — Z9989 Dependence on other enabling machines and devices: Secondary | ICD-10-CM

## 2021-05-26 DIAGNOSIS — M1000 Idiopathic gout, unspecified site: Secondary | ICD-10-CM

## 2021-05-26 LAB — BASIC METABOLIC PANEL
Anion gap: 7 (ref 5–15)
BUN: 27 mg/dL — ABNORMAL HIGH (ref 8–23)
CO2: 23 mmol/L (ref 22–32)
Calcium: 8.8 mg/dL — ABNORMAL LOW (ref 8.9–10.3)
Chloride: 102 mmol/L (ref 98–111)
Creatinine, Ser: 2.58 mg/dL — ABNORMAL HIGH (ref 0.61–1.24)
GFR, Estimated: 27 mL/min — ABNORMAL LOW (ref >60)
Glucose, Bld: 90 mg/dL (ref 70–99)
Potassium: 4.3 mmol/L (ref 3.5–5.1)
Sodium: 132 mmol/L — ABNORMAL LOW (ref 135–145)

## 2021-05-26 MED ORDER — METOPROLOL TARTRATE 50 MG PO TABS: 50.0000 mg | ORAL_TABLET | Freq: Two times a day (BID) | ORAL | 2 refills | Status: DC

## 2021-05-26 MED ORDER — COLCHICINE 0.6 MG PO TABS: 0.6000 mg | ORAL_TABLET | Freq: Every day | ORAL | 3 refills | Status: DC

## 2021-05-26 NOTE — Patient Instructions (Signed)
Medication Instructions:  Your physician has recommended you make the following change in your medication:  START Colchicine 0.6 mg tablets daily  *If you need a refill on your cardiac medications before your next appointment, please call your pharmacy*   Lab Work: BMET If you have labs (blood work) drawn today and your tests are completely normal, you will receive your results only by: [facility] (if you have MyChart) OR A paper copy in the mail If you have any lab test that is abnormal or we need to change your treatment, we will call you to review the results.   Testing/Procedures: None   Follow-Up: At [facility], you and your health needs are our priority.  As part of our continuing mission to provide you with exceptional heart care, we have created designated Provider Care Teams.  These Care Teams include your primary Cardiologist (physician) and Advanced Practice Providers (APPs -  Physician Assistants and Nurse Practitioners) who all work together to provide you with the care you need, when you need it.  We recommend signing up for the patient portal called "MyChart".  Sign up information is provided on this After Visit Summary.  MyChart is used to connect with patients for Virtual Visits (Telemedicine).  Patients are able to view lab/test results, encounter notes, upcoming appointments, etc.  Non-urgent messages can be sent to your provider as well.   To learn more about what you can do with MyChart, go to [email or web address].    Your next appointment:   1 month(s)  The format for your next appointment:   In Person  Provider:   You may see Rozann Lesches, MD or one of the following Advanced Practice Providers on your designated Care Team:   Ermalinda Barrios, PA-C    Other Instructions

## 2021-05-27 ENCOUNTER — Telehealth: Payer: Self-pay | Admitting: Physician Assistant

## 2021-05-27 DIAGNOSIS — N179 Acute kidney failure, unspecified: Secondary | ICD-10-CM

## 2021-05-27 DIAGNOSIS — N189 Chronic kidney disease, unspecified: Secondary | ICD-10-CM

## 2021-05-27 NOTE — Telephone Encounter (Signed)
-----   Message from Imogene Burn, PA-C sent at 05/26/2021  3:40 PM EDT ----- Kidney function still up. Please avoid all NSAID's. Please refer to renal. Make sure he's only taking lasix 20 mg once daily.

## 2021-05-27 NOTE — Telephone Encounter (Signed)
Spoke to pt who verbalized understanding of results. Pt had no other questions. Pt confirmed that he was only taking one 20 mg tablet daily.  Renal referral entered.

## 2021-05-27 NOTE — Telephone Encounter (Signed)
Pt is returning a call he received yesterday, he was not sure who called. Thinks it's about one of his new Rx's.   Please call [phone]

## 2021-05-27 NOTE — Telephone Encounter (Signed)
Imogene Burn, PA-C Kidney function still up. Please avoid all NSAID's. Please refer to renal. Make sure he's only taking lasix 20 mg once daily.   Medication- Colchicine 0.6 mg tablets QD- going through prior authorization.

## 2021-05-28 ENCOUNTER — Other Ambulatory Visit: Payer: Self-pay | Admitting: Physician Assistant

## 2021-06-02 NOTE — Progress Notes (Signed)
PATIENT: Joshua Maldonado DOB: 1956/05/01  REASON FOR VISIT: follow up HISTORY FROM: patient PRIMARY NEUROLOGIST: Dr. Brett Fairy  HISTORY OF PRESENT ILLNESS: Today 06/02/21:  Joshua Maldonado is a 65 year old male with a history of obstructive sleep apnea on CPAP.  He returns today for his initial CPAP download.  He reports that he still having trouble sleeping at night.  Has not noticed much difference with the CPAP.  His download is below.    HISTORY Joshua Maldonado is a 17 - year- old African American male patient and seen here on 12/02/2020 for a sleep consultation.  Chief concern according to patient :  "I do not know how severe my apnea was , at first I thought CPAP helped me but now I am not so sure, The patient can't answer any questions as to machine type, settings and  used it about 2 years, with a FFM.   Joshua Maldonado  has a past medical history of Cirrhosis ([place]), Dilation of biliary tract, GERD (gastroesophageal reflux disease), Gout, Hepatitis C, HLD (hyperlipidemia), Hypertension, and Sleep apnea.  He has surgical history of spine surgery, carpal tunnel .   The patient had the first sleep study in the year 2015 in [place]. it was a SPLIT study. He was unemployed at the time.    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 Family status is widowed, 2 adult children , one of them in [place], [place].  Pets are not  present. Tobacco use- none- quit 2021.  ETOH use ; "socially, Caffeine intake in form of Coffee( 2-3 / day). No Regular exercise.     Sleep habits are as follows: The patient's dinner time is unscheduled, variable - The patient goes to bed at any time between midnight and 2 AM  - having trouble to fall asleep-and continues to sleep for 3-4  hours, wakes for 2-3 bathroom breaks. Average sleep time is about 5 hours.    The preferred sleep position is sideways, with the support of 1 pillow.  Dreams are reportedly rare. 7 AM is the usual rise time.  The patient wakes up spontaneously.  He reports not feeling refreshed or restored in AM, with symptoms such as dry mouth , no morning headaches, and residual fatigue.  Naps are taken many days, lasting from 2-3 PM minutes and are more refreshing than nocturnal sleep.   REVIEW OF SYSTEMS: Out of a complete 14 system review of symptoms, the patient complains only of the following symptoms, and all other reviewed systems are negative.  FSS  38 ESS 8  ALLERGIES: No Known Allergies  HOME MEDICATIONS: Outpatient Medications Prior to Visit  Medication Sig Dispense Refill   allopurinol (ZYLOPRIM) 100 MG tablet Take 100 mg by mouth daily as needed (gout).      amLODipine (NORVASC) 10 MG tablet Take 1 tablet (10 mg total) by mouth daily. 30 tablet 2   atorvastatin (LIPITOR) 10 MG tablet Take 10 mg by mouth daily.     colchicine 0.6 MG tablet Take 1 tablet (0.6 mg total) by mouth daily. 90 tablet 3   DULoxetine (CYMBALTA) 30 MG capsule Take 30 mg by mouth daily.     fluticasone (  FLONASE) 50 MCG/ACT nasal spray Place 1 spray into both nostrils daily as needed for allergies or rhinitis.     furosemide (LASIX) 20 MG tablet Take 40 mg daily for 3 day then reduce to 20 mg daily 90 tablet 3   gabapentin (NEURONTIN) 600 MG tablet Take 1 tablet (600 mg total) by mouth 2 (two) times daily as needed.     hydrALAZINE (APRESOLINE) 10 MG tablet Take 1 tablet (10 mg total) by mouth every 8 (eight) hours. 90 tablet 2   HYDROcodone-acetaminophen (NORCO/VICODIN) 5-325 MG tablet Take 1 tablet by mouth every 6 (six) hours as needed. 20 tablet 0   Indomethacin 20 MG CAPS Take 1 capsule by mouth 3 (three) times daily as needed. 21 capsule 0    metoprolol tartrate (LOPRESSOR) 50 MG tablet Take 1 tablet (50 mg total) by mouth 2 (two) times daily. 60 tablet 2   pantoprazole (PROTONIX) 40 MG tablet TAKE 1 TABLET (40 MG TOTAL) BY MOUTH 2 (TWO) TIMES DAILY BEFORE A MEAL. 60 tablet 3   potassium chloride (KLOR-CON) 10 MEQ tablet Take 20 meq daily for 3 days then reduce to 10 meq Daily 90 tablet 3   sucralfate (CARAFATE) 1 g tablet Take 1 tablet (1 g total) by mouth daily.     No facility-administered medications prior to visit.    PAST MEDICAL HISTORY: Past Medical History:  Diagnosis Date   Cirrhosis [facility])    Per patient, diagnosed at [facility].  Reports liver biopsy at [facility].  Suspected to be secondary to hepatitis C.and ETOH.    Dilation of biliary tract    Dilated CBD. MRI May 2021 suggesting benign variation.   GERD (gastroesophageal reflux disease)    Gout    Hepatitis C    Per patient, s/p treatment with Harvoni at [facility]; hep C RNA not detected April 2021.   HLD (hyperlipidemia)    Hypertension    Sleep apnea     PAST SURGICAL HISTORY: Past Surgical History:  Procedure Laterality Date   BACK SURGERY     LIVER BIOPSY     Per patient- at [place]: Family History  Problem Relation Age of Onset   Hypertension Father    Colon cancer Neg Hx     SOCIAL HISTORY: Social History   Socioeconomic History   Marital status: Single    Spouse name: Not on file   Number of children: Not on file   Years of education: Not on file   Highest education level: Not on file  Occupational History   Not on file  Tobacco Use   Smoking status: Every Day    Packs/day: 0.25    Years: 12.00    Pack years: 3.00    Types: Cigarettes   Smokeless tobacco: Never  Vaping Use   Vaping Use: Never used  Substance and Sexual Activity   Alcohol use: Yes    Alcohol/week: 5.0 standard drinks    Types: 2 Cans of beer, 3 Shots of liquor per week    Comment: couple shots every 3 days.    Drug use:  Not Currently    Types: Marijuana, Cocaine    Comment: Last used Marijuana in 2021. Last use cocaine in 2020.    Sexual activity: Yes  Other Topics Concern   Not on file  Social History Narrative   Not on file   Social Determinants of Health   Financial Resource Strain: Not on file  Food  Insecurity: Not on file  Transportation Needs: Not on file  Physical Activity: Not on file  Stress: Not on file  Social Connections: Not on file  Intimate Partner Violence: Not on file      PHYSICAL EXAM  Vitals:   06/03/21 1006  BP: 128/82  Pulse: (!) 59  Weight: 222 lb 9.6 oz (101 kg)  Height: '5\' 7"'$  (1.702 m)   Body mass index is 34.86 kg/m.  Generalized: Well developed, in no acute distress  Chest: Lungs clear to auscultation bilaterally  Neurological examination  Mentation: Alert oriented to time, place, history taking. Follows all commands speech and language fluent Cranial nerve II-XII: Extraocular movements were full, visual field were full on confrontational test Head turning and shoulder shrug  were normal and symmetric. Motor: The motor testing reveals 5 over 5 strength of all 4 extremities. Good symmetric motor tone is noted throughout.  Sensory: Sensory testing is intact to soft touch on all 4 extremities. No evidence of extinction is noted.  Gait and station: Gait is normal.    DIAGNOSTIC DATA (LABS, IMAGING, TESTING) - I reviewed patient records, labs, notes, testing and imaging myself where available.  Lab Results  Component Value Date   WBC 7.6 05/20/2021   HGB 15.0 05/20/2021   HCT 45.2 05/20/2021   MCV 92.4 05/20/2021   PLT 149 (L) 05/20/2021      Component Value Date/Time   NA 132 (L) 05/26/2021 1419   K 4.3 05/26/2021 1419   CL 102 05/26/2021 1419   CO2 23 05/26/2021 1419   GLUCOSE 90 05/26/2021 1419   BUN 27 (H) 05/26/2021 1419   CREATININE 2.58 (H) 05/26/2021 1419   CREATININE 1.84 (H) 01/31/2020 1433   CALCIUM 8.8 (L) 05/26/2021 1419   PROT 7.8  09/04/2020 0845   ALBUMIN 3.3 (L) 09/04/2020 0845   AST 20 09/04/2020 0845   ALT 22 09/04/2020 0845   ALKPHOS 67 09/04/2020 0845   BILITOT 0.8 09/04/2020 0845   GFRNONAA 27 (L) 05/26/2021 1419   GFRNONAA 39 (L) 01/31/2020 1433   GFRAA 47 (L) 04/05/2020 1139   GFRAA 45 (L) 01/31/2020 1433      ASSESSMENT AND PLAN 65 y.o. year old male  has a past medical history of Cirrhosis ([place]), Dilation of biliary tract, GERD (gastroesophageal reflux disease), Gout, Hepatitis C, HLD (hyperlipidemia), Hypertension, and Sleep apnea. here with:  OSA on CPAP  - CPAP compliance suboptimal - Residual AHI remains elevated.  We will adjust pressure 9 to 18 cm water.  We will get a download in 1 month.  If no improvement and residual AHI remains elevated may recommend a CPAP titration. - Encourage patient to use CPAP nightly and > 4 hours each night - F/U in 6 months or sooner if needed   Ward Givens, MSN, NP-C 06/02/2021, 7:02 PM [facility] [address], [place], [place] [address] [phone]

## 2021-06-03 ENCOUNTER — Ambulatory Visit: Payer: Medicaid Other | Admitting: Adult Health

## 2021-06-03 ENCOUNTER — Encounter: Payer: Self-pay | Admitting: Adult Health

## 2021-06-03 VITALS — BP 128/82 | HR 59 | Ht 67.0 in | Wt 222.6 lb

## 2021-06-03 DIAGNOSIS — G4733 Obstructive sleep apnea (adult) (pediatric): Secondary | ICD-10-CM

## 2021-06-03 DIAGNOSIS — Z9989 Dependence on other enabling machines and devices: Secondary | ICD-10-CM

## 2021-06-03 NOTE — Patient Instructions (Signed)
Continue using CPAP nightly and greater than 4 hours each night I changed pressure setting If your symptoms worsen or you develop new symptoms please let us know.

## 2021-06-13 ENCOUNTER — Other Ambulatory Visit: Payer: Self-pay | Admitting: Physician Assistant

## 2021-06-16 NOTE — Telephone Encounter (Addendum)
Covering Michele's box this week.She previously prescribed this as a one time med for this patient for gout and recommended he f/u PCP for this. There is no particular other alternative for this medicine aside from steroids for gout so would recommend he see PCP/urgent care to discuss next steps if still having issues. (Cannot take NSAIDs due to renal problems.) Thx.

## 2021-06-23 ENCOUNTER — Ambulatory Visit: Payer: Medicaid Other | Admitting: Physician Assistant

## 2021-06-30 ENCOUNTER — Other Ambulatory Visit: Payer: Self-pay | Admitting: Gastroenterology

## 2021-06-30 DIAGNOSIS — R1010 Upper abdominal pain, unspecified: Secondary | ICD-10-CM

## 2021-07-01 NOTE — Progress Notes (Signed)
Cardiology Office Note    Date:  07/07/2021   ID:  Joshua Maldonado, DOB 1956/05/25, MRN [id number]   PCP:  Alanson Puls, [facility]  Cardiologist:  Rozann Lesches, MD   Advanced Practice Provider:  No care team member to display Electrophysiologist:  None   [id number]   Chief Complaint  Patient presents with   Follow-up     History of Present Illness:  Joshua Maldonado is a 65 y.o. male w/PMH of HTN, HLD, OSA on CPAP and cirrhosis with history of Hepatitis C who was admitted for acute pancreatitis 09/2020 Cardiology consulted due to episodes of SVT. Echo normal LVEF 60-65% with moderate LVH no WMA treated with metoprolol.    I saw the patient 04/22/2021 with leg edema and I increased Lasix to 40 mg daily for 3 days then 20 mg daily.  Also ordered Dopplers to rule out DVT because of a long train ride, this was negative creatinine 03/18/2021 was 2.22.  Creatinine 2.25 on 04/29/2021 but went up to 2.69 05/20/21.  I last saw the patient 05/26/2021 and he had lost 6 pounds and was trying to watch his diet closer.  He still has swelling but it was down some.  He was given hydrocodone in the ED for gout and was still having a lot of ankle pain.  Creatinine went up to 2.69 05/20/2021 with extra Lasix.  Repeat that day I saw was 2.58.  He was only on Lasix 20 mg once daily.  He was asked to stop all nonsteroidals and given colchicine for gout.  Was referred to renal.  He was also been out of metoprolol for 2 months so we refilled that.  Patient comes in for f/u. Denies chest pain, palpitations, leg swelling. Weight down, no further edema, dyspnea, palpitations or cardiac complaints. 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 Smoking 6 cigarettes daily.   Past Medical History:  Diagnosis Date   Cirrhosis [facility])    Per patient, diagnosed at [facility].  Reports liver biopsy at [facility].  Suspected to be secondary to hepatitis C.and ETOH.     Dilation of biliary tract    Dilated CBD. MRI May 2021 suggesting benign variation.   GERD (gastroesophageal reflux disease)    Gout    Hepatitis C    Per patient, s/p treatment with Harvoni at [facility]; hep C RNA not detected April 2021.   HLD (hyperlipidemia)    Hypertension    Sleep apnea     Past Surgical History:  Procedure Laterality Date   BACK SURGERY     LIVER BIOPSY     Per patient- at [facility]    Current Medications: Current Meds  Medication Sig   allopurinol (ZYLOPRIM) 100 MG tablet Take 100 mg by mouth daily as needed (gout).    amLODipine (NORVASC) 10 MG tablet Take 1 tablet (10 mg total) by mouth daily.   atorvastatin (LIPITOR) 10 MG tablet Take 10 mg by mouth daily.   colchicine 0.6 MG tablet Take 1 tablet (0.6 mg total) by mouth daily.   DULoxetine (CYMBALTA) 30 MG capsule Take 30 mg by mouth daily.   fluticasone (FLONASE) 50 MCG/ACT nasal spray Place 1 spray into both nostrils daily as needed for allergies or rhinitis.   fluticasone (FLONASE) 50 MCG/ACT nasal spray Place into the nose.   furosemide (LASIX) 20 MG tablet Take 40 mg daily for 3 day then reduce to 20  mg daily   gabapentin (NEURONTIN) 600 MG tablet Take 1 tablet (600 mg total) by mouth 2 (two) times daily as needed.   hydrALAZINE (APRESOLINE) 10 MG tablet Take 1 tablet (10 mg total) by mouth every 8 (eight) hours.   hydrALAZINE (APRESOLINE) 10 MG tablet Take by mouth.   HYDROcodone-acetaminophen (NORCO/VICODIN) 5-325 MG tablet Take 1 tablet by mouth every 6 (six) hours as needed.   Indomethacin 20 MG CAPS Take 1 capsule by mouth 3 (three) times daily as needed.   metoprolol tartrate (LOPRESSOR) 50 MG tablet Take 1 tablet (50 mg total) by mouth 2 (two) times daily.   pantoprazole (PROTONIX) 40 MG tablet TAKE 1 TABLET (40 MG TOTAL) BY MOUTH 2 (TWO) TIMES DAILY BEFORE A MEAL.   potassium chloride (KLOR-CON) 10 MEQ tablet Take 20 meq daily for 3 days then reduce to 10 meq Daily   sucralfate  (CARAFATE) 1 g tablet Take 1 tablet (1 g total) by mouth daily.     Allergies:   Patient has no known allergies.   Social History   Socioeconomic History   Marital status: Single    Spouse name: Not on file   Number of children: Not on file   Years of education: Not on file   Highest education level: Not on file  Occupational History   Not on file  Tobacco Use   Smoking status: Every Day    Packs/day: 0.25    Years: 12.00    Pack years: 3.00    Types: Cigarettes   Smokeless tobacco: Never  Vaping Use   Vaping Use: Never used  Substance and Sexual Activity   Alcohol use: Yes    Alcohol/week: 5.0 standard drinks    Types: 2 Cans of beer, 3 Shots of liquor per week    Comment: couple shots every 3 days.    Drug use: Not Currently    Types: Marijuana, Cocaine    Comment: Last used Marijuana in 2021. Last use cocaine in 2020.    Sexual activity: Yes  Other Topics Concern   Not on file  Social History Narrative   Not on file   Social Determinants of Health   Financial Resource Strain: Not on file  Food Insecurity: Not on file  Transportation Needs: Not on file  Physical Activity: Not on file  Stress: Not on file  Social Connections: Not on file     Family History:  The patient's  family history includes Hypertension in his father.   ROS:   Please see the history of present illness.    ROS All other systems reviewed and are negative.   PHYSICAL EXAM:   VS:  BP 118/72   Pulse 73   Ht '5\' 7"'$  (1.702 m)   Wt 212 lb (96.2 kg)   SpO2 97%   BMI 33.20 kg/m   Physical Exam  GEN: Obese, in no acute distress  Neck: no JVD, carotid bruits, or masses Cardiac:RRR; no murmurs, rubs, or gallops  Respiratory:  clear to auscultation bilaterally, normal work of breathing GI: soft, nontender, nondistended, + BS Ext: without cyanosis, clubbing, or edema, Good distal pulses bilaterally Neuro:  Alert and Oriented x 3 Psych: euthymic mood, full affect  Wt Readings from Last 3  Encounters:  07/07/21 212 lb (96.2 kg)  06/03/21 222 lb 9.6 oz (101 kg)  05/26/21 221 lb (100.2 kg)      Studies/Labs Reviewed:   EKG:  EKG is not ordered today.   Recent Labs: 09/03/2020:  Magnesium 1.8 09/04/2020: ALT 22 05/20/2021: Hemoglobin 15.0; Platelets 149 05/26/2021: BUN 27; Creatinine, Ser 2.58; Potassium 4.3; Sodium 132   Lipid Panel No results found for: CHOL, TRIG, HDL, CHOLHDL, VLDL, LDLCALC, LDLDIRECT  Additional studies/ records that were reviewed today include:   Lower extremity venous Doppler 04/25/2021 IMPRESSION: No evidence of DVT within either lower extremity.     Electronically Signed   By: Sandi Mariscal M.D.   On: 04/25/2021 15:04    Echocardiogram: 09/04/2020 IMPRESSIONS     1. Left ventricular ejection fraction, by estimation, is 60 to 65%. The  left ventricle has normal function. The left ventricle has no regional  wall motion abnormalities. There is moderate left ventricular hypertrophy.  Left ventricular diastolic  parameters are indeterminate.   2. Right ventricular systolic function is normal. The right ventricular  size is normal.   3. The mitral valve is grossly normal. Trivial mitral valve  regurgitation.   4. The aortic valve is tricuspid. Aortic valve regurgitation is not  visualized.   5. Aortic dilatation noted. There is mild dilatation of the aortic root,  measuring 39 mm.   6. The inferior vena cava is normal in size with greater than 50%  respiratory variability, suggesting right atrial pressure of 3 mmHg.     Risk Assessment/Calculations:         ASSESSMENT:    1. Paroxysmal SVT (supraventricular tachycardia) (HCC)   2. Leg swelling   3. CKD (chronic kidney disease) stage 4, GFR 15-29 ml/min (HCC)   4. Idiopathic gout, unspecified chronicity, unspecified site   5. OSA on CPAP   6. Tobacco abuse      PLAN:  In order of problems listed above:  SVT on metoprolol-no palpitations.  Lower extremity edema with  moderate LVH echo 09/2020 EF 60 to 65%-resolved on low dose lasix  CKD creatinine 2.58 05/26/2021-being followed by Dr. Theador Hawthorne  Hypertension blood pressure much better controlled on amlodipine, hydralazine, metoprolol and low-dose Lasix  Gout improved.  Insurance will not pay for colchicine  OSA compliant on CPAP  Tobacco abuse continues to smoke 6 cigarettes daily.  Smoking cessation discussed.    Shared Decision Making/Informed Consent        Medication Adjustments/Labs and Tests Ordered: Current medicines are reviewed at length with the patient today.  Concerns regarding medicines are outlined above.  Medication changes, Labs and Tests ordered today are listed in the Patient Instructions below. Patient Instructions  Medication Instructions:  Your physician recommends that you continue on your current medications as directed. Please refer to the Current Medication list given to you today.  *If you need a refill on your cardiac medications before your next appointment, please call your pharmacy*   Lab Work: NONE   If you have labs (blood work) drawn today and your tests are completely normal, you will receive your results only by: [place] (if you have MyChart) OR A paper copy in the mail If you have any lab test that is abnormal or we need to change your treatment, we will call you to review the results.   Testing/Procedures: NONE    Follow-Up: At [facility], you and your health needs are our priority.  As part of our continuing mission to provide you with exceptional heart care, we have created designated Provider Care Teams.  These Care Teams include your primary Cardiologist (physician) and Advanced Practice Providers (APPs -  Physician Assistants and Nurse Practitioners) who all work together to provide you with the  care you need, when you need it.  We recommend signing up for the patient portal called "MyChart".  Sign up information is provided on this  After Visit Summary.  MyChart is used to connect with patients for Virtual Visits (Telemedicine).  Patients are able to view lab/test results, encounter notes, upcoming appointments, etc.  Non-urgent messages can be sent to your provider as well.   To learn more about what you can do with MyChart, go to [email or web address].    Your next appointment:   6 month(s)  The format for your next appointment:   In Person  Provider:   Rozann Lesches, MD   Other Instructions Thank you for choosing [facility]!     Sumner Boast, PA-C  07/07/2021 2:00 PM    [facility] [address], [place], [place]  [address] Phone: [phone]; Fax: [phone]

## 2021-07-04 ENCOUNTER — Other Ambulatory Visit: Payer: Self-pay | Admitting: Nephrology

## 2021-07-04 ENCOUNTER — Other Ambulatory Visit (HOSPITAL_COMMUNITY): Payer: Self-pay | Admitting: Nephrology

## 2021-07-04 DIAGNOSIS — R809 Proteinuria, unspecified: Secondary | ICD-10-CM

## 2021-07-04 DIAGNOSIS — I129 Hypertensive chronic kidney disease with stage 1 through stage 4 chronic kidney disease, or unspecified chronic kidney disease: Secondary | ICD-10-CM

## 2021-07-04 DIAGNOSIS — N184 Chronic kidney disease, stage 4 (severe): Secondary | ICD-10-CM

## 2021-07-07 ENCOUNTER — Ambulatory Visit (INDEPENDENT_AMBULATORY_CARE_PROVIDER_SITE_OTHER): Payer: Medicaid Other | Admitting: Physician Assistant

## 2021-07-07 ENCOUNTER — Other Ambulatory Visit: Payer: Self-pay

## 2021-07-07 ENCOUNTER — Encounter: Payer: Self-pay | Admitting: Physician Assistant

## 2021-07-07 VITALS — BP 118/72 | HR 73 | Ht 67.0 in | Wt 212.0 lb

## 2021-07-07 DIAGNOSIS — I471 Supraventricular tachycardia: Secondary | ICD-10-CM | POA: Diagnosis not present

## 2021-07-07 DIAGNOSIS — G4733 Obstructive sleep apnea (adult) (pediatric): Secondary | ICD-10-CM

## 2021-07-07 DIAGNOSIS — Z720 Tobacco use: Secondary | ICD-10-CM

## 2021-07-07 DIAGNOSIS — N184 Chronic kidney disease, stage 4 (severe): Secondary | ICD-10-CM | POA: Diagnosis not present

## 2021-07-07 DIAGNOSIS — M1000 Idiopathic gout, unspecified site: Secondary | ICD-10-CM

## 2021-07-07 DIAGNOSIS — M7989 Other specified soft tissue disorders: Secondary | ICD-10-CM | POA: Diagnosis not present

## 2021-07-07 DIAGNOSIS — Z9989 Dependence on other enabling machines and devices: Secondary | ICD-10-CM

## 2021-07-07 NOTE — Patient Instructions (Signed)
Medication Instructions:  Your physician recommends that you continue on your current medications as directed. Please refer to the Current Medication list given to you today.  *If you need a refill on your cardiac medications before your next appointment, please call your pharmacy*   Lab Work: NONE   If you have labs (blood work) drawn today and your tests are completely normal, you will receive your results only by: . MyChart Message (if you have MyChart) OR . A paper copy in the mail If you have any lab test that is abnormal or we need to change your treatment, we will call you to review the results.   Testing/Procedures: NONE    Follow-Up: At [facility], you and your health needs are our priority.  As part of our continuing mission to provide you with exceptional heart care, we have created designated Provider Care Teams.  These Care Teams include your primary Cardiologist (physician) and Advanced Practice Providers (APPs -  Physician Assistants and Nurse Practitioners) who all work together to provide you with the care you need, when you need it.  We recommend signing up for the patient portal called "MyChart".  Sign up information is provided on this After Visit Summary.  MyChart is used to connect with patients for Virtual Visits (Telemedicine).  Patients are able to view lab/test results, encounter notes, upcoming appointments, etc.  Non-urgent messages can be sent to your provider as well.   To learn more about what you can do with MyChart, go to [email or web address].    Your next appointment:   6 month(s)  The format for your next appointment:   In Person  Provider:   Samuel McDowell, MD   Other Instructions Thank you for choosing [facility]!    

## 2021-07-14 ENCOUNTER — Other Ambulatory Visit: Payer: Self-pay

## 2021-07-14 ENCOUNTER — Ambulatory Visit (HOSPITAL_COMMUNITY)
Admission: RE | Admit: 2021-07-14 | Discharge: 2021-07-14 | Disposition: A | Discharge status: Home / Self Care | Payer: Medicaid Other | Source: Ambulatory Visit | Attending: Nephrology | Admitting: Nephrology

## 2021-07-14 DIAGNOSIS — R809 Proteinuria, unspecified: Secondary | ICD-10-CM | POA: Diagnosis present

## 2021-07-14 DIAGNOSIS — I129 Hypertensive chronic kidney disease with stage 1 through stage 4 chronic kidney disease, or unspecified chronic kidney disease: Secondary | ICD-10-CM

## 2021-07-14 DIAGNOSIS — N184 Chronic kidney disease, stage 4 (severe): Secondary | ICD-10-CM | POA: Diagnosis not present

## 2021-08-04 ENCOUNTER — Ambulatory Visit: Payer: Medicaid Other | Admitting: Cardiology

## 2021-08-05 ENCOUNTER — Encounter: Payer: Self-pay | Admitting: Gastroenterology

## 2021-08-05 NOTE — Progress Notes (Deleted)
Referring Provider: Alanson Puls [facility] Primary Care Physician:  [facility] Primary GI Physician: Dr. Gala Romney  No chief complaint on file.   HPI:   Joshua Maldonado is a 65 y.o. 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 No prior variceal screening s/p 2 EGD attempts. First attempt in July 2021, but patient did not follow n.p.o. instructions, so procedure was canceled.  Second attempt in November 2021, but procedure canceled due to diastolic BP 875 mm. Also, patient previously reported colonoscopy at [facility].  We requested records and no record of colonoscopy was found.   Last seen in our office 11/04/2020.  GERD well controlled on Protonix 40 mg twice daily.  Overall, much improved following a recent pancreatitis.  Reported mild upper abdominal pain every now and then.  Reported ongoing alcohol use every 3 days and suspected this was contributing to his intermittent upper abdominal pain.  No signs or symptoms of decompensated liver disease.  Constipation resolved with short course of MiraLAX, and no longer needing medications for this.  He was in need of colon cancer screening and esophageal variceal screening.  Patient was noted to have BP of 165/100, prior EGD canceled due to diastolic BP 643.  Reported he had not taken his BP medications in 3 days.  Recommended resuming BP medications, and come back in 2-3 weeks for BP recheck.  If BP improves, would proceed with colonoscopy and EGD.  Counseled on the importance of absolute alcohol cessation, continue PPI twice daily, follow-up in 4 months.  Patient did not return for BP recheck.   Today:  Cirrhosis:    Labs: Overdue. Most recent CBC and CMP 10/6 (LFTs, T bili, sodium, potassium within normal limits, creatinine 2.54, hemoglobin within normal  limits). Needs INR updated. MELD 15 in November/December 2021. Anticipate increase and kidney function has been worsening, following with Dr. Theador Hawthorne.  [place] Screening: Overdue. Last ultrasound November 2021. Variceal Screening:  Immune to Hep A.  Needs Hep B vaccination.   GERD:   Colon cancer screening:    Past Medical History:  Diagnosis Date   Cirrhosis [facility])    Per patient, diagnosed at [facility].  Reports liver biopsy at [facility].  Suspected to be secondary to hepatitis C.and ETOH.    Dilation of biliary tract    Dilated CBD. MRI May 2021 suggesting benign variation.   GERD (gastroesophageal reflux disease)    Gout    Hepatitis C    Per patient, s/p treatment with Harvoni at [facility]; hep C RNA not detected April 2021.   HLD (hyperlipidemia)    Hypertension    Sleep apnea     Past Surgical History:  Procedure Laterality Date   BACK SURGERY     LIVER BIOPSY     Per patient- at [facility]    Current Outpatient Medications  Medication Sig Dispense Refill   allopurinol (ZYLOPRIM) 100 MG tablet Take 100 mg by mouth daily as needed (gout).      amLODipine (NORVASC) 10 MG tablet Take 1 tablet (10 mg total) by mouth daily. 30 tablet 2   atorvastatin (LIPITOR) 10 MG tablet Take 10 mg by mouth daily.     colchicine 0.6 MG tablet Take 1 tablet (0.6 mg total) by mouth daily. 90 tablet 3   DULoxetine (CYMBALTA) 30  MG capsule Take 30 mg by mouth daily.     fluticasone (FLONASE) 50 MCG/ACT nasal spray Place 1 spray into both nostrils daily as needed for allergies or rhinitis.     fluticasone (FLONASE) 50 MCG/ACT nasal spray Place into the nose.     furosemide (LASIX) 20 MG tablet Take 40 mg daily for 3 day then reduce to 20 mg daily 90 tablet 3   gabapentin (NEURONTIN) 600 MG tablet Take 1 tablet (600 mg total) by mouth 2 (two) times daily as needed.     hydrALAZINE (APRESOLINE) 10 MG tablet Take 1 tablet (10 mg total) by mouth every 8 (eight) hours. 90 tablet 2    hydrALAZINE (APRESOLINE) 10 MG tablet Take by mouth.     HYDROcodone-acetaminophen (NORCO/VICODIN) 5-325 MG tablet Take 1 tablet by mouth every 6 (six) hours as needed. 20 tablet 0   Indomethacin 20 MG CAPS Take 1 capsule by mouth 3 (three) times daily as needed. 21 capsule 0   metoprolol tartrate (LOPRESSOR) 50 MG tablet Take 1 tablet (50 mg total) by mouth 2 (two) times daily. 60 tablet 2   pantoprazole (PROTONIX) 40 MG tablet TAKE 1 TABLET (40 MG TOTAL) BY MOUTH 2 (TWO) TIMES DAILY BEFORE A MEAL. 180 tablet 1   potassium chloride (KLOR-CON) 10 MEQ tablet Take 20 meq daily for 3 days then reduce to 10 meq Daily 90 tablet 3   sucralfate (CARAFATE) 1 g tablet Take 1 tablet (1 g total) by mouth daily.     No current facility-administered medications for this visit.    Allergies as of 08/06/2021   (No Known Allergies)    Family History  Problem Relation Age of Onset   Hypertension Father    Colon cancer Neg Hx     Social History   Socioeconomic History   Marital status: Single    Spouse name: Not on file   Number of children: Not on file   Years of education: Not on file   Highest education level: Not on file  Occupational History   Not on file  Tobacco Use   Smoking status: Every Day    Packs/day: 0.25    Years: 12.00    Pack years: 3.00    Types: Cigarettes   Smokeless tobacco: Never  Vaping Use   Vaping Use: Never used  Substance and Sexual Activity   Alcohol use: Yes    Alcohol/week: 5.0 standard drinks    Types: 2 Cans of beer, 3 Shots of liquor per week    Comment: couple shots every 3 days.    Drug use: Not Currently    Types: Marijuana, Cocaine    Comment: Last used Marijuana in 2021. Last use cocaine in 2020.    Sexual activity: Yes  Other Topics Concern   Not on file  Social History Narrative   Not on file   Social Determinants of Health   Financial Resource Strain: Not on file  Food Insecurity: Not on file  Transportation Needs: Not on file   Physical Activity: Not on file  Stress: Not on file  Social Connections: Not on file    Review of Systems: Gen: Denies fever, chills, anorexia. Denies fatigue, weakness, weight loss.  CV: Denies chest pain, palpitations, syncope, peripheral edema, and claudication. Resp: Denies dyspnea at rest, cough, wheezing, coughing up blood, and pleurisy. GI: Denies vomiting blood, jaundice, and fecal incontinence.   Denies dysphagia or odynophagia. Derm: Denies rash, itching, dry skin Psych: Denies depression, anxiety, memory  loss, confusion. No homicidal or suicidal ideation.  Heme: Denies bruising, bleeding, and enlarged lymph nodes.  Physical Exam: There were no vitals taken for this visit. General:   Alert and oriented. No distress noted. Pleasant and cooperative.  Head:  Normocephalic and atraumatic. Eyes:  Conjuctiva clear without scleral icterus. Mouth:  Oral mucosa pink and moist. Good dentition. No lesions. Heart:  S1, S2 present without murmurs appreciated. Lungs:  Clear to auscultation bilaterally. No wheezes, rales, or rhonchi. No distress.  Abdomen:  +BS, soft, non-tender and non-distended. No rebound or guarding. No HSM or masses noted. Msk:  Symmetrical without gross deformities. Normal posture. Extremities:  Without edema. Neurologic:  Alert and  oriented x4 Psych:  Alert and cooperative. Normal mood and affect.

## 2021-08-06 ENCOUNTER — Encounter: Payer: Self-pay | Admitting: Internal Medicine

## 2021-08-06 ENCOUNTER — Ambulatory Visit: Payer: Medicaid Other | Admitting: Gastroenterology

## 2021-08-27 ENCOUNTER — Ambulatory Visit: Payer: Medicaid Other | Admitting: Internal Medicine

## 2021-08-27 ENCOUNTER — Encounter: Payer: Self-pay | Admitting: Internal Medicine

## 2021-11-25 ENCOUNTER — Telehealth: Payer: Self-pay | Admitting: *Deleted

## 2021-11-25 NOTE — Telephone Encounter (Signed)
Spoke to patient has not worn CPAP since Nov 2022 . 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 Will keep his [facility] tomorrow to discuss his options for treatment

## 2021-11-26 ENCOUNTER — Telehealth: Payer: Medicaid Other | Admitting: Adult Health

## 2021-12-02 ENCOUNTER — Telehealth: Payer: Medicaid Other | Admitting: Adult Health

## 2021-12-24 ENCOUNTER — Other Ambulatory Visit: Payer: Self-pay | Admitting: Gastroenterology

## 2021-12-24 ENCOUNTER — Encounter: Payer: Self-pay | Admitting: Internal Medicine

## 2021-12-24 DIAGNOSIS — R1010 Upper abdominal pain, unspecified: Secondary | ICD-10-CM

## 2021-12-24 NOTE — Telephone Encounter (Signed)
Last office visit 11/04/20 ?

## 2021-12-24 NOTE — Telephone Encounter (Signed)
Sending in limited refills. He needs an OV as he hasn't been seen since January 2022. Needs to be an in person visit as we also need to follow-up on Cirrhosis.  ? ?Loma Sousa, please let patient know.  ? ?Erline Levine, please arrange follow-up.  ?

## 2021-12-29 NOTE — Telephone Encounter (Signed)
Spoke to pt, informed him he needs to have office visit. Pt voiced understanding.  ?

## 2022-04-16 ENCOUNTER — Other Ambulatory Visit: Payer: Self-pay | Admitting: Physician Assistant

## 2022-05-19 ENCOUNTER — Other Ambulatory Visit: Payer: Self-pay | Admitting: Cardiology

## 2022-06-02 ENCOUNTER — Other Ambulatory Visit: Payer: Self-pay | Admitting: Cardiology

## 2022-07-16 ENCOUNTER — Other Ambulatory Visit: Payer: Self-pay | Admitting: Cardiology

## 2022-07-17 ENCOUNTER — Other Ambulatory Visit: Payer: Self-pay | Admitting: Physician Assistant

## 2022-07-26 NOTE — Progress Notes (Unsigned)
Cardiology Office Note  Date: 07/27/2022   ID: Joshua Maldonado, DOB Sep 15, 1956, MRN [id number]  PCP:  [facility]  Cardiologist:  Marjo Bicker, MD Electrophysiologist:  None   Reason for Office Visit: Follow-up of paroxysmal SVT   History of Present Illness: Joshua Maldonado is a 66 y.o. male known to have HTN, HLD, paroxysmal SVT in the setting of acute pancreatitis in 2021, nicotine abuse, OSA on CPAP presented to the clinic for follow-up visit. No interval ER visits or hospitalizations. Patient denied any rest or exertional chest discomfort, tightness, heaviness or pressure, rest or exertional dyspnea, palpitations, light-headedness, syncope and claudication. Reported intermittent right lower extremity swelling. compliant with medications and no side-effects. No bleeding complications. Smoking 1 pack per 3 days.  Past Medical History:  Diagnosis Date   Cirrhosis [facility])    Per patient, diagnosed at [facility].  Reports liver biopsy at [facility].  Suspected to be secondary to hepatitis C.and ETOH.    CKD (chronic kidney disease)    Dilation of biliary tract    Dilated CBD. MRI May 2021 suggesting benign variation.   GERD (gastroesophageal reflux disease)    Gout    Hepatitis C    Per patient, s/p treatment with Harvoni at [facility]; hep C RNA not detected April 2021.   HLD (hyperlipidemia)    Hypertension    LVH (left ventricular hypertrophy)    Moderate, normal EF.   Paroxysmal SVT (supraventricular tachycardia)    Sleep apnea     Past Surgical History:  Procedure Laterality Date   BACK SURGERY     LIVER BIOPSY     Per patient- at [facility]    Current Outpatient Medications  Medication Sig Dispense Refill   allopurinol (ZYLOPRIM) 100 MG tablet Take 100 mg by mouth daily as needed (gout).      amLODipine (NORVASC) 10 MG tablet Take 1 tablet (10 mg total) by mouth daily. 30 tablet 2   atorvastatin (LIPITOR) 10 MG tablet Take 10 mg by  mouth daily.     colchicine 0.6 MG tablet Take 1 tablet (0.6 mg total) by mouth daily. Please attend scheduled appointment for additional refills. (Patient taking differently: Take 0.6 mg by mouth as needed. Please attend scheduled appointment for additional refills.) 30 tablet 0   furosemide (LASIX) 20 MG tablet TAKE 1 TABLET BY MOUTH EVERY DAY 30 tablet 2   gabapentin (NEURONTIN) 600 MG tablet Take 1 tablet (600 mg total) by mouth 2 (two) times daily as needed.     hydrALAZINE (APRESOLINE) 10 MG tablet Take 1 tablet (10 mg total) by mouth every 8 (eight) hours. 90 tablet 2   Indomethacin 20 MG CAPS Take 1 capsule by mouth 3 (three) times daily as needed. 21 capsule 0   metoprolol tartrate (LOPRESSOR) 50 MG tablet Take 1 tablet (50 mg total) by mouth 2 (two) times daily. 60 tablet 2   potassium chloride (KLOR-CON) 10 MEQ tablet Take 20 meq daily for 3 days then reduce to 10 meq Daily 90 tablet 3   DULoxetine (CYMBALTA) 30 MG capsule Take 30 mg by mouth daily. (Patient not taking: Reported on 07/27/2022)     pantoprazole (PROTONIX) 40 MG tablet Take 1 tablet (40 mg total) by mouth 2 (two) times daily before a meal. (Patient not taking: Reported on 07/27/2022) 180 tablet 0   No current facility-administered medications for this visit.   Allergies:  Patient has no known allergies.  Social History: The patient  reports that he has been smoking cigarettes. He has a 3.00 pack-year smoking history. He has never used smokeless tobacco. He reports current alcohol use of about 5.0 standard drinks of alcohol per week. He reports that he does not currently use drugs after having used the following drugs: Marijuana and Cocaine.   Family History: The patient's family history includes Hypertension in his father.   ROS:  Please see the history of present illness. Otherwise, complete review of systems is positive for none.  All other systems are reviewed and negative.   Physical Exam: VS:  BP 138/88   Pulse  (!) 59   Ht 5\' 7"  (1.702 m)   Wt 202 lb (91.6 kg)   SpO2 94%   BMI 31.64 kg/m , BMI Body mass index is 31.64 kg/m.  Wt Readings from Last 3 Encounters:  07/27/22 202 lb (91.6 kg)  07/07/21 212 lb (96.2 kg)  06/03/21 222 lb 9.6 oz (101 kg)    General: Patient appears comfortable at rest. HEENT: Conjunctiva and lids normal, oropharynx clear with moist mucosa. Neck: Supple, no elevated JVP or carotid bruits, no thyromegaly. Lungs: Clear to auscultation, nonlabored breathing at rest. Cardiac: Regular rate and rhythm, no S3 or significant systolic murmur, no pericardial rub. Abdomen: Soft, nontender, no hepatomegaly, bowel sounds present, no guarding or rebound. Extremities: No pitting edema, distal pulses 2+. Skin: Warm and dry. Musculoskeletal: No kyphosis. Neuropsychiatric: Alert and oriented x3, affect grossly appropriate.  ECG:  An ECG dated 07/27/2022 was personally reviewed today and demonstrated:  Sinus bradycardia, heart rate 59, QTc 413 ms  Recent Labwork: No results found for requested labs within last 365 days.  No results found for: "CHOL", "TRIG", "HDL", "CHOLHDL", "VLDL", "LDLCALC", "LDLDIRECT"  Other Studies Reviewed Today: Echo in 2021  1. Left ventricular ejection fraction, by estimation, is 60 to 65%. The  left ventricle has normal function. The left ventricle has no regional  wall motion abnormalities. There is moderate left ventricular hypertrophy.  Left ventricular diastolic  parameters are indeterminate.   2. Right ventricular systolic function is normal. The right ventricular  size is normal.   3. The mitral valve is grossly normal. Trivial mitral valve  regurgitation.   4. The aortic valve is tricuspid. Aortic valve regurgitation is not  visualized.   5. Aortic dilatation noted. There is mild dilatation of the aortic root,  measuring 39 mm.   6. The inferior vena cava is normal in size with greater than 50%  respiratory variability, suggesting right  atrial pressure of 3 mmHg.   Assessment and Plan: Patient is a 66 year old M known to have HTN, HLD, paroxysmal SVT in the setting of acute pancreatitis in 2021, nicotine abuse, OSA on CPAP presented to the clinic for follow-up visit.   #Paroxysmal SVT in setting of acute pancreatitis Plan -Continue metoprolol tartrate 50 mg twice daily  #HTN, partially controlled Plan -Continue home antihypertensives, amlodipine 10 mg once daily, hydralazine 10 mg 3 times a day. Instructed patient to check his blood pressure at home in the morning every day. 2 g sodium diet.  #Nicotine abuse Plan -Smoking cessation counseling provided, instructed patient to cut down cigarettes.  He voiced understanding.  #RLE swelling Plan -Patient had isolated right lower extremity swelling last year for which his PCP in [place] prescribed Lasix 20 mg after which the swelling resolved. He also stated that his swelling resolved after cutting down salt in his diet 2.  Instructed to take him  Lasix 20 mg as needed for SOB or lower EXTR swelling.  I have spent a total of 30 minutes with patient reviewing chart, EKGs, labs and examining patient as well as establishing an assessment and plan that was discussed with the patient.  > 50% of time was spent in direct patient care.      Medication Adjustments/Labs and Tests Ordered: Current medicines are reviewed at length with the patient today.  Concerns regarding medicines are outlined above.   Tests Ordered: No orders of the defined types were placed in this encounter.   Medication Changes: No orders of the defined types were placed in this encounter.   Disposition:  Follow up  1 year  Signed Lavonya Hoerner Verne Spurr, MD, 07/27/2022 11:01 AM    [facility] at [facility] [address] [place], [place], [place] [address]

## 2022-07-27 ENCOUNTER — Ambulatory Visit: Payer: Medicaid Other | Attending: Internal Medicine | Admitting: Internal Medicine

## 2022-07-27 ENCOUNTER — Encounter: Payer: Self-pay | Admitting: Internal Medicine

## 2022-07-27 VITALS — BP 138/88 | HR 59 | Ht 67.0 in | Wt 202.0 lb

## 2022-07-27 DIAGNOSIS — M7989 Other specified soft tissue disorders: Secondary | ICD-10-CM

## 2022-07-27 DIAGNOSIS — I471 Supraventricular tachycardia, unspecified: Secondary | ICD-10-CM | POA: Diagnosis not present

## 2022-07-27 DIAGNOSIS — Z720 Tobacco use: Secondary | ICD-10-CM | POA: Diagnosis not present

## 2022-07-27 MED ORDER — FUROSEMIDE 20 MG PO TABS: 20.0000 mg | ORAL_TABLET | Freq: Every day | ORAL | 3 refills | Status: DC | PRN

## 2022-07-27 MED ORDER — METOPROLOL TARTRATE 50 MG PO TABS: 50.0000 mg | ORAL_TABLET | Freq: Two times a day (BID) | ORAL | 3 refills | Status: DC

## 2022-07-27 NOTE — Patient Instructions (Signed)
Medication Instructions:  Your physician has recommended you make the following change in your medication:  Take lasix 20 mg as needed Continue all other medications as prescribed  Labwork: none  Testing/Procedures: none  Follow-Up:  Your physician recommends that you schedule a follow-up appointment in: 1 year. You will receive a call in about 10 months reminding you to schedule your appointment. If you do not receive this call, please contact our office.   Any Other Special Instructions Will Be Listed Below (If Applicable).  If you need a refill on your cardiac medications before your next appointment, please call your pharmacy.

## 2022-08-14 ENCOUNTER — Other Ambulatory Visit: Payer: Self-pay | Admitting: Physician Assistant

## 2022-08-25 ENCOUNTER — Other Ambulatory Visit: Payer: Self-pay | Admitting: Physician Assistant

## 2022-09-17 ENCOUNTER — Other Ambulatory Visit: Payer: Self-pay | Admitting: Physician Assistant

## 2022-09-19 ENCOUNTER — Other Ambulatory Visit: Payer: Self-pay | Admitting: Physician Assistant

## 2022-09-21 ENCOUNTER — Other Ambulatory Visit: Payer: Self-pay | Admitting: Physician Assistant

## 2022-09-22 ENCOUNTER — Telehealth: Payer: Self-pay | Admitting: *Deleted

## 2022-09-22 NOTE — Telephone Encounter (Signed)
Left detailed voice message - informing him that we have denied the Colchicine x 2 with his pharmacy.  We do not maintain this medication for gout - need to request from pcp.

## 2023-01-04 ENCOUNTER — Other Ambulatory Visit (HOSPITAL_COMMUNITY): Payer: Self-pay | Admitting: Nephrology

## 2023-01-04 DIAGNOSIS — I129 Hypertensive chronic kidney disease with stage 1 through stage 4 chronic kidney disease, or unspecified chronic kidney disease: Secondary | ICD-10-CM

## 2023-01-04 DIAGNOSIS — N170 Acute kidney failure with tubular necrosis: Secondary | ICD-10-CM

## 2023-01-04 DIAGNOSIS — N184 Chronic kidney disease, stage 4 (severe): Secondary | ICD-10-CM

## 2023-01-29 ENCOUNTER — Other Ambulatory Visit: Payer: Self-pay | Admitting: Student

## 2023-01-29 ENCOUNTER — Other Ambulatory Visit: Payer: Self-pay | Admitting: Radiology

## 2023-01-29 DIAGNOSIS — N189 Chronic kidney disease, unspecified: Secondary | ICD-10-CM

## 2023-01-29 NOTE — Progress Notes (Signed)
Chief Complaint: Patient was seen in consultation today for chronic kidney disease/proteinuria; non-focal renal biopsy   Referring Physician(s): Bhutani,Manpreet S  Supervising Physician: Gilmer Mor  Patient Status: [facility] - Out-pt  History of Present Illness: Joshua Maldonado is a 67 y.o. male with a medical history significant for cirrhosis, Hepatitis C, HTN, paroxysmal atrial fibrillation and chronic kidney disease. He is followed by Nephrology. His creatinine levels have been trending upwards over the past few years and is now at 3.4. UPEP/SPEP and 24-hour urine UPEP/Urine IFE negative for M spike. Autoimmune work up and renal ultrasound were also negative. Additional work up was positive for proteinuria and hyperkalemia.    Interventional Radiology has been asked to evaluate this patient for an image-guided non-focal renal biopsy for further work up.   Past Medical History:  Diagnosis Date   Cirrhosis [facility])    Per patient, diagnosed at [facility].  Reports liver biopsy at [facility].  Suspected to be secondary to hepatitis C.and ETOH.    CKD (chronic kidney disease)    Dilation of biliary tract    Dilated CBD. MRI May 2021 suggesting benign variation.   GERD (gastroesophageal reflux disease)    Gout    Hepatitis C    Per patient, s/p treatment with Harvoni at [facility]; hep C RNA not detected April 2021.   HLD (hyperlipidemia)    Hypertension    LVH (left ventricular hypertrophy)    Moderate, normal EF.   Paroxysmal SVT (supraventricular tachycardia)    Sleep apnea     Past Surgical History:  Procedure Laterality Date   BACK SURGERY     LIVER BIOPSY     Per patient- at [facility]    Allergies: Patient has no known allergies.  Medications: Prior to Admission medications   Medication Sig Start Date End Date Taking? Authorizing Provider  allopurinol (ZYLOPRIM) 100 MG tablet Take 100 mg by mouth daily as needed (gout).  04/05/20   [provider]   amLODipine (NORVASC) 10 MG tablet Take 1 tablet (10 mg total) by mouth daily. 09/07/20   Johnson, Clanford L, MD  atorvastatin (LIPITOR) 10 MG tablet Take 10 mg by mouth daily.    [provider]  colchicine 0.6 MG tablet TAKE 1 TABLET DAILY. PLEASE ATTEND SCHEDULED APPOINTMENT FOR ADDITIONAL REFILLS. 08/26/22   Dyann Kief, PA-C  DULoxetine (CYMBALTA) 30 MG capsule Take 30 mg by mouth daily. Patient not taking: Reported on 07/27/2022 07/12/20   [provider]  furosemide (LASIX) 20 MG tablet Take 1 tablet (20 mg total) by mouth daily as needed for edema. 07/27/22   Mallipeddi, Vishnu P, MD  gabapentin (NEURONTIN) 600 MG tablet Take 1 tablet (600 mg total) by mouth 2 (two) times daily as needed. 09/07/20   Johnson, Clanford L, MD  hydrALAZINE (APRESOLINE) 10 MG tablet Take 1 tablet (10 mg total) by mouth every 8 (eight) hours. 09/07/20   Johnson, Clanford L, MD  Indomethacin 20 MG CAPS Take 1 capsule by mouth 3 (three) times daily as needed. 11/11/20   Avegno, Zachery Dakins, FNP  metoprolol tartrate (LOPRESSOR) 50 MG tablet Take 1 tablet (50 mg total) by mouth 2 (two) times daily. 07/27/22   Mallipeddi, Vishnu P, MD  pantoprazole (PROTONIX) 40 MG tablet Take 1 tablet (40 mg total) by mouth 2 (two) times daily before a meal. Patient not taking: Reported on 07/27/2022 12/24/21   Letta Median, PA-C  potassium chloride (KLOR-CON) 10 MEQ tablet Take 20 meq daily  for 3 days then reduce to 10 meq Daily 04/22/21   Dyann Kief, PA-C     Family History  Problem Relation Age of Onset   Hypertension Father    Colon cancer Neg Hx     Social History   Socioeconomic History   Marital status: Single    Spouse name: Not on file   Number of children: Not on file   Years of education: Not on file   Highest education level: Not on file  Occupational History   Not on file  Tobacco Use   Smoking status: Every Day    Packs/day: 0.25    Years: 12.00    Additional pack years: 0.00     Total pack years: 3.00    Types: Cigarettes   Smokeless tobacco: Never  Vaping Use   Vaping Use: Never used  Substance and Sexual Activity   Alcohol use: Yes    Alcohol/week: 5.0 standard drinks of alcohol    Types: 2 Cans of beer, 3 Shots of liquor per week    Comment: couple shots every 3 days.    Drug use: Not Currently    Types: Marijuana, Cocaine    Comment: Last used Marijuana in 2021. Last use cocaine in 2020.    Sexual activity: Yes  Other Topics Concern   Not on file  Social History Narrative   Not on file   Social Determinants of Health   Financial Resource Strain: Not on file  Food Insecurity: Not on file  Transportation Needs: Not on file  Physical Activity: Not on file  Stress: Not on file  Social Connections: Not on file    Review of Systems: A 12 point ROS discussed and pertinent positives are indicated in the HPI above.  All other systems are negative.  Review of Systems  Constitutional:  Negative for appetite change and fatigue.  Respiratory:  Negative for cough and shortness of breath.   Cardiovascular:  Positive for leg swelling. Negative for chest pain.  Gastrointestinal:  Negative for abdominal pain, diarrhea, nausea and vomiting.  Genitourinary:  Negative for difficulty urinating.  Musculoskeletal:  Negative for back pain.  Neurological:  Negative for dizziness and headaches.    Vital Signs: BP (!) 136/90   Pulse (!) 57   Temp 98.2 F (36.8 C) (Oral)   Resp 16   Ht 5\' 7"  (1.702 m)   Wt 201 lb (91.2 kg)   SpO2 95%   BMI 31.48 kg/m   Physical Exam Constitutional:      General: He is not in acute distress.    Appearance: He is not ill-appearing.  HENT:     Mouth/Throat:     Mouth: Mucous membranes are moist.     Pharynx: Oropharynx is clear.  Cardiovascular:     Rate and Rhythm: Normal rate and regular rhythm.     Pulses: Normal pulses.     Heart sounds: Normal heart sounds.  Pulmonary:     Effort: Pulmonary effort is normal.      Breath sounds: Normal breath sounds.  Abdominal:     General: Bowel sounds are normal.     Palpations: Abdomen is soft.     Tenderness: There is no abdominal tenderness.  Musculoskeletal:     Right lower leg: Edema present.     Left lower leg: Edema present.  Skin:    General: Skin is warm and dry.  Neurological:     Mental Status: He is alert and oriented to person,  place, and time.  Psychiatric:        Mood and Affect: Mood normal.        Behavior: Behavior normal.        Thought Content: Thought content normal.        Judgment: Judgment normal.     Imaging: No results found.  Labs:  CBC: Recent Labs    02/01/23 0630  WBC 6.7  HGB 14.3  HCT 43.0  PLT 161    COAGS: Recent Labs    02/01/23 0630  INR 1.0    BMP: No results for input(s): "NA", "K", "CL", "CO2", "GLUCOSE", "BUN", "CALCIUM", "CREATININE", "GFRNONAA", "GFRAA" in the last 8760 hours.  Invalid input(s): "CMP"  LIVER FUNCTION TESTS: No results for input(s): "BILITOT", "AST", "ALT", "ALKPHOS", "PROT", "ALBUMIN" in the last 8760 hours.  TUMOR MARKERS: No results for input(s): "AFPTM", "CEA", "CA199", "CHROMGRNA" in the last 8760 hours.  Assessment and Plan:  CKD stage IV; nephrotic range proteinuria: Joshua Maldonado, 67 year old male, presents today to the [facility] Interventional Radiology department for an image-guided non-focal renal biopsy.  Risks and benefits of this procedure were discussed with the patient and/or patient's family including, but not limited to bleeding, infection, damage to adjacent structures or low yield requiring additional tests.  All of the questions were answered and there is agreement to proceed. He has been NPO. He is a full code. He does not take any blood-thinning medications.   Consent signed and in chart.   Thank you for this interesting consult.  I greatly enjoyed meeting Joshua Maldonado and look forward to participating in their care.  A copy of this report was  sent to the requesting provider on this date.  Electronically Signed: Alwyn Ren, AGACNP-BC [phone] 02/01/2023, 7:22 AM   I spent a total of  30 Minutes   in face to face in clinical consultation, greater than 50% of which was counseling/coordinating care for non-focal renal biopsy

## 2023-01-31 ENCOUNTER — Other Ambulatory Visit: Payer: Self-pay | Admitting: Student

## 2023-02-01 ENCOUNTER — Encounter (HOSPITAL_COMMUNITY): Payer: Self-pay

## 2023-02-01 ENCOUNTER — Ambulatory Visit (HOSPITAL_COMMUNITY)
Admission: RE | Admit: 2023-02-01 | Discharge: 2023-02-01 | Disposition: A | Discharge status: Home / Self Care | Payer: Medicaid Other | Source: Ambulatory Visit | Attending: Nephrology | Admitting: Nephrology

## 2023-02-01 DIAGNOSIS — N189 Chronic kidney disease, unspecified: Secondary | ICD-10-CM

## 2023-02-01 DIAGNOSIS — I480 Paroxysmal atrial fibrillation: Secondary | ICD-10-CM | POA: Diagnosis not present

## 2023-02-01 DIAGNOSIS — I129 Hypertensive chronic kidney disease with stage 1 through stage 4 chronic kidney disease, or unspecified chronic kidney disease: Secondary | ICD-10-CM | POA: Insufficient documentation

## 2023-02-01 DIAGNOSIS — N184 Chronic kidney disease, stage 4 (severe): Secondary | ICD-10-CM | POA: Diagnosis not present

## 2023-02-01 DIAGNOSIS — E875 Hyperkalemia: Secondary | ICD-10-CM | POA: Insufficient documentation

## 2023-02-01 DIAGNOSIS — N170 Acute kidney failure with tubular necrosis: Secondary | ICD-10-CM

## 2023-02-01 DIAGNOSIS — K7460 Unspecified cirrhosis of liver: Secondary | ICD-10-CM | POA: Diagnosis not present

## 2023-02-01 DIAGNOSIS — B1920 Unspecified viral hepatitis C without hepatic coma: Secondary | ICD-10-CM | POA: Diagnosis not present

## 2023-02-01 DIAGNOSIS — R809 Proteinuria, unspecified: Secondary | ICD-10-CM | POA: Insufficient documentation

## 2023-02-01 LAB — CBC
HCT: 43 % (ref 39.0–52.0)
Hemoglobin: 14.3 g/dL (ref 13.0–17.0)
MCH: 30.9 pg (ref 26.0–34.0)
MCHC: 33.3 g/dL (ref 30.0–36.0)
MCV: 92.9 fL (ref 80.0–100.0)
Platelets: 161 10*3/uL (ref 150–400)
RBC: 4.63 MIL/uL (ref 4.22–5.81)
RDW: 12.9 % (ref 11.5–15.5)
WBC: 6.7 10*3/uL (ref 4.0–10.5)
nRBC: 0 % (ref 0.0–0.2)

## 2023-02-01 LAB — PROTIME-INR
INR: 1 (ref 0.8–1.2)
Prothrombin Time: 13.5 seconds (ref 11.4–15.2)

## 2023-02-01 MED ORDER — GELATIN ABSORBABLE 12-7 MM EX MISC: 1.0000 | Freq: Once | CUTANEOUS | Status: DC

## 2023-02-01 MED ORDER — FENTANYL CITRATE (PF) 100 MCG/2ML IJ SOLN
INTRAMUSCULAR | Status: AC
  Filled 2023-02-01: qty 2

## 2023-02-01 MED ORDER — FENTANYL CITRATE (PF) 100 MCG/2ML IJ SOLN
INTRAMUSCULAR | Status: AC | PRN
  Administered 2023-02-01: 25 ug via INTRAVENOUS

## 2023-02-01 MED ORDER — MIDAZOLAM HCL 2 MG/2ML IJ SOLN
INTRAMUSCULAR | Status: AC | PRN
  Administered 2023-02-01: 1 mg via INTRAVENOUS

## 2023-02-01 MED ORDER — SODIUM CHLORIDE 0.9 % IV SOLN: INTRAVENOUS | Status: DC

## 2023-02-01 MED ORDER — LIDOCAINE HCL (PF) 1 % IJ SOLN: 10.0000 mL | Freq: Once | INTRAMUSCULAR | Status: DC

## 2023-02-01 MED ORDER — MIDAZOLAM HCL 2 MG/2ML IJ SOLN
INTRAMUSCULAR | Status: AC
  Filled 2023-02-01: qty 2

## 2023-02-01 NOTE — Procedures (Signed)
Interventional Radiology Procedure Note  Procedure: US guided biopsy of right kidney Complications: None EBL: None Recommendations: - Bedrest 2 hours.   - Routine wound care - Follow up pathology - Advance diet   Signed,  Gilmer Mor, DO

## 2023-02-01 NOTE — Progress Notes (Signed)
Patient and daughter was given discharge instructions. Both verbalized understanding. 

## 2023-02-05 ENCOUNTER — Encounter (HOSPITAL_COMMUNITY): Payer: Self-pay

## 2023-02-08 LAB — SURGICAL PATHOLOGY

## 2023-05-23 ENCOUNTER — Ambulatory Visit
Admission: EM | Admit: 2023-05-23 | Discharge: 2023-05-23 | Disposition: A | Discharge status: Home / Self Care | Payer: Medicaid Other | Attending: Nurse Practitioner | Admitting: Nurse Practitioner

## 2023-05-23 DIAGNOSIS — L03314 Cellulitis of groin: Secondary | ICD-10-CM | POA: Diagnosis not present

## 2023-05-23 MED ORDER — DOXYCYCLINE HYCLATE 100 MG PO TABS: 100.0000 mg | ORAL_TABLET | Freq: Two times a day (BID) | ORAL | 0 refills | Status: DC

## 2023-05-23 NOTE — ED Provider Notes (Signed)
[facility]    CSN: [id number] Arrival date & time: 05/23/23  1433      History   Chief Complaint No chief complaint on file.   HPI Joshua Maldonado is a 67 y.o. male.   The history is provided by the patient.   Patient presents for complaints of a "boil" to the left groin that been present for the past 4 days.  He states the area has become more painful to touch.  He denies fever, chills, chest pain, abdominal pain, nausea, vomiting, diarrhea, or drainage from the site.  Patient reports he has been applying Neosporin to the area.  Patient denies history of diabetes.  Past Medical History:  Diagnosis Date   Cirrhosis [facility])    Per patient, diagnosed at [facility].  Reports liver biopsy at [facility].  Suspected to be secondary to hepatitis C.and ETOH.    CKD (chronic kidney disease)    Dilation of biliary tract    Dilated CBD. MRI May 2021 suggesting benign variation.   GERD (gastroesophageal reflux disease)    Gout    Hepatitis C    Per patient, s/p treatment with Harvoni at [facility]; hep C RNA not detected April 2021.   HLD (hyperlipidemia)    Hypertension    LVH (left ventricular hypertrophy)    Moderate, normal EF.   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/15/2021   Severe obstructive sleep apnea-hypopnea syndrome 01/15/2021   Paroxysmal SVT (supraventricular tachycardia) 01/15/2021   Grief reaction 01/15/2021   History of pancreatitis 11/04/2020   GERD (gastroesophageal reflux disease) 11/04/2020   Colon cancer screening 11/04/2020   Acute pancreatitis 09/02/2020   Essential hypertension 09/02/2020   Hyperlipidemia 09/02/2020   Acute kidney injury superimposed on CKD (HCC) 09/02/2020   Hyperglycemia 09/02/2020   Elevated lipase  09/02/2020   History of hepatitis C 01/31/2020   Cirrhosis of liver without ascites (HCC) 01/31/2020   Dilation of biliary tract 01/31/2020   Abdominal pain 01/31/2020   Constipation 01/31/2020    Past Surgical History:  Procedure Laterality Date   BACK SURGERY     LIVER BIOPSY     Per patient- at [facility] Medications    Prior to Admission medications   Medication Sig Start Date End Date Taking? Authorizing Provider  doxycycline (VIBRA-TABS) 100 MG tablet Take 1 tablet (100 mg total) by mouth 2 (two) times daily. 05/23/23  Yes Korinne Greenstein-Warren, Sadie Haber, NP  allopurinol (ZYLOPRIM) 100 MG tablet Take 100 mg by mouth daily as needed (gout).  04/05/20   [provider]  amLODipine (NORVASC) 10 MG tablet Take 1 tablet (10 mg total) by mouth daily. 09/07/20   Johnson, Clanford L, MD  atorvastatin (LIPITOR) 10 MG tablet Take 10 mg by mouth daily.    [provider]  colchicine 0.6 MG tablet TAKE 1 TABLET DAILY. PLEASE ATTEND SCHEDULED APPOINTMENT FOR ADDITIONAL REFILLS. 08/26/22   Dyann Kief, PA-C  DULoxetine (CYMBALTA) 30 MG capsule Take 30 mg by mouth daily. Patient not taking: Reported on 07/27/2022 07/12/20   [provider]  furosemide (LASIX) 20 MG tablet Take 1 tablet (20 mg total) by mouth daily as needed for edema. 07/27/22   Mallipeddi, Orion Modest, MD  gabapentin (  NEURONTIN) 600 MG tablet Take 1 tablet (600 mg total) by mouth 2 (two) times daily as needed. 09/07/20   Johnson, Clanford L, MD  hydrALAZINE (APRESOLINE) 10 MG tablet Take 1 tablet (10 mg total) by mouth every 8 (eight) hours. 09/07/20   Johnson, Clanford L, MD  Indomethacin 20 MG CAPS Take 1 capsule by mouth 3 (three) times daily as needed. 11/11/20   Avegno, Zachery Dakins, FNP  metoprolol tartrate (LOPRESSOR) 50 MG tablet Take 1 tablet (50 mg total) by mouth 2 (two) times daily. 07/27/22   Mallipeddi, Vishnu P, MD  pantoprazole (PROTONIX) 40 MG tablet Take 1 tablet (40 mg total) by  mouth 2 (two) times daily before a meal. Patient not taking: Reported on 07/27/2022 12/24/21   Letta Median, PA-C  potassium chloride (KLOR-CON) 10 MEQ tablet Take 20 meq daily for 3 days then reduce to 10 meq Daily 04/22/21   Dyann Kief, PA-C    Family History Family History  Problem Relation Age of Onset   Hypertension Father    Colon cancer Neg Hx     Social History Social History   Tobacco Use   Smoking status: Every Day    Current packs/day: 0.25    Average packs/day: 0.3 packs/day for 12.0 years (3.0 ttl pk-yrs)    Types: Cigarettes   Smokeless tobacco: Never  Vaping Use   Vaping status: Never Used  Substance Use Topics   Alcohol use: Yes    Alcohol/week: 5.0 standard drinks of alcohol    Types: 2 Cans of beer, 3 Shots of liquor per week    Comment: couple shots every 3 days.    Drug use: Not Currently    Types: Marijuana, Cocaine    Comment: Last used Marijuana in 2021. Last use cocaine in 2020.      Allergies   Patient has no known allergies.   Review of Systems Review of Systems Per HPI  Physical Exam Triage Vital Signs ED Triage Vitals  Encounter Vitals Group     BP 05/23/23 1525 (!) 160/80     Systolic BP Percentile --      Diastolic BP Percentile --      Pulse Rate 05/23/23 1525 73     Resp 05/23/23 1525 18     Temp 05/23/23 1525 98.4 F (36.9 C)     Temp Source 05/23/23 1525 Oral     SpO2 05/23/23 1525 95 %     Weight --      Height --      Head Circumference --      Peak Flow --      Pain Score 05/23/23 1526 5     Pain Loc --      Pain Education --      Exclude from Growth Chart --    No data found.  Updated Vital Signs BP (!) 160/80 (BP Location: Right Arm)   Pulse 73   Temp 98.4 F (36.9 C) (Oral)   Resp 18   SpO2 95%   Visual Acuity Right Eye Distance:   Left Eye Distance:   Bilateral Distance:    Right Eye Near:   Left Eye Near:    Bilateral Near:     Physical Exam Vitals and nursing note reviewed.   Constitutional:      General: He is not in acute distress.    Appearance: Normal appearance.  HENT:     Head: Normocephalic.  Eyes:     Extraocular Movements: Extraocular  movements intact.     Pupils: Pupils are equal, round, and reactive to light.  Pulmonary:     Effort: Pulmonary effort is normal.  Skin:    General: Skin is warm and dry.       Neurological:     General: No focal deficit present.     Mental Status: He is alert and oriented to person, place, and time.  Psychiatric:        Mood and Affect: Mood normal.        Behavior: Behavior normal.      UC Treatments / Results  Labs (all labs ordered are listed, but only abnormal results are displayed) Labs Reviewed - No data to display  EKG   Radiology No results found.  Procedures Procedures (including critical care time)  Medications Ordered in UC Medications - No data to display  Initial Impression / Assessment and Plan / UC Course  I have reviewed the triage vital signs and the nursing notes.  Pertinent labs & imaging results that were available during my care of the patient were reviewed by me and considered in my medical decision making (see chart for details).  The patient is well appearing, he is in no acute distress, vital signs are stable.  Patient with cellulitis in the left groin.  There is an induration present, but there is no fluctuance present at this time.  Will treat empirically with doxycycline 100 mg twice daily for the next 5 days.  Supportive care recommendations were provided and discussed with the patient to include warm compresses, over-the-counter analgesics, and cleansing the area with an antibacterial soap.  Discussed indications with patient of when follow-up may be necessary, along with indications of when going to the emergency department would be indicated.  Patient is in agreement with this plan of care and verbalizes understanding.  All questions were answered.  Patient stable  for discharge.  Final Clinical Impressions(s) / UC Diagnoses   Final diagnoses:  Cellulitis of left groin     Discharge Instructions      Take medication as prescribed. Warm compresses to the affected area 3-4 times daily. Clean the area at least twice daily with Dial Gold bar soap or another type of antibacterial soap. If the area begins to drain, keep it covered. Follow-up in this clinic if you continue to experience pain is, increased swelling, or other concerns.  Go to the emergency department if you develop fever, chills, generalized fatigue, nausea, vomiting, or if the area of redness spreads into the genital region, or if you have foul-smelling drainage.     ED Prescriptions     Medication Sig Dispense Auth. Provider   doxycycline (VIBRA-TABS) 100 MG tablet Take 1 tablet (100 mg total) by mouth 2 (two) times daily. 10 tablet Lessie Manigo-Warren, Sadie Haber, NP      PDMP not reviewed this encounter.   Abran Cantor, NP 05/23/23 1538

## 2023-05-23 NOTE — ED Triage Notes (Signed)
Pt reports he has a boil on his left side groin area that is sore x 4 days.

## 2023-05-23 NOTE — Discharge Instructions (Signed)
Take medication as prescribed. Warm compresses to the affected area 3-4 times daily. Clean the area at least twice daily with Dial Gold bar soap or another type of antibacterial soap. If the area begins to drain, keep it covered. Follow-up in this clinic if you continue to experience pain is, increased swelling, or other concerns.  Go to the emergency department if you develop fever, chills, generalized fatigue, nausea, vomiting, or if the area of redness spreads into the genital region, or if you have foul-smelling drainage.

## 2023-05-23 NOTE — ED Notes (Signed)
Called pt 1 time from the waiting room and 1 time via telephone and no answer.

## 2023-06-10 ENCOUNTER — Other Ambulatory Visit: Payer: Self-pay | Admitting: *Deleted

## 2023-06-10 DIAGNOSIS — N186 End stage renal disease: Secondary | ICD-10-CM

## 2023-06-17 ENCOUNTER — Other Ambulatory Visit (HOSPITAL_COMMUNITY): Payer: Medicaid Other

## 2023-06-17 ENCOUNTER — Encounter (HOSPITAL_COMMUNITY): Payer: Medicaid Other

## 2023-06-17 ENCOUNTER — Encounter: Payer: Medicaid Other | Admitting: Vascular Surgery

## 2023-08-02 ENCOUNTER — Ambulatory Visit (INDEPENDENT_AMBULATORY_CARE_PROVIDER_SITE_OTHER)
Admission: RE | Admit: 2023-08-02 | Discharge: 2023-08-02 | Disposition: A | Discharge status: Home / Self Care | Payer: 59 | Source: Ambulatory Visit | Attending: Vascular Surgery

## 2023-08-02 ENCOUNTER — Encounter: Payer: Self-pay | Admitting: Surgery

## 2023-08-02 ENCOUNTER — Ambulatory Visit (HOSPITAL_COMMUNITY)
Admission: RE | Admit: 2023-08-02 | Discharge: 2023-08-02 | Disposition: A | Discharge status: Home / Self Care | Payer: 59 | Source: Ambulatory Visit | Attending: Vascular Surgery | Admitting: Vascular Surgery

## 2023-08-02 ENCOUNTER — Ambulatory Visit (INDEPENDENT_AMBULATORY_CARE_PROVIDER_SITE_OTHER): Payer: 59 | Admitting: Surgery

## 2023-08-02 VITALS — BP 145/92 | HR 60 | Temp 98.2°F | Resp 20 | Ht 67.0 in | Wt 198.0 lb

## 2023-08-02 DIAGNOSIS — N184 Chronic kidney disease, stage 4 (severe): Secondary | ICD-10-CM | POA: Diagnosis not present

## 2023-08-02 DIAGNOSIS — N186 End stage renal disease: Secondary | ICD-10-CM | POA: Diagnosis present

## 2023-08-02 NOTE — Progress Notes (Signed)
[facility] [place]  Patient name: Joshua Maldonado MRN: [id number] DOB: 10/21/1955 Sex: male   REQUESTING PROVIDER:    Johnn Hai   REASON FOR CONSULT:    Dialysis access  HISTORY OF PRESENT ILLNESS:   Joshua Maldonado is a 67 y.o. male, who is referred for dialysis access.  The patient is right-handed.  He does not have a pacemaker or defibrillator.  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  Cocaine abuse history also could be playing a role.  The patient has a history of cirrhosis, likely related to hepatitis.  He takes a statin for hypercholesterolemia.  He currently works as a Naval [employer].  PAST MEDICAL HISTORY    Past Medical History:  Diagnosis Date   Cirrhosis [facility])    Per patient, diagnosed at [facility].  Reports liver biopsy at [facility].  Suspected to be secondary to hepatitis C.and ETOH.    CKD (chronic kidney disease)    Dilation of biliary tract    Dilated CBD. MRI May 2021 suggesting benign variation.   GERD (gastroesophageal reflux disease)    Gout    Hepatitis C    Per patient, s/p treatment with Harvoni at [facility]; hep C RNA not detected April 2021.   HLD (hyperlipidemia)    Hypertension    LVH (left ventricular hypertrophy)    Moderate, normal EF.   Paroxysmal SVT (supraventricular tachycardia)    Sleep apnea      FAMILY HISTORY   Family History  Problem Relation Age of Onset   Hypertension Father    Colon cancer Neg Hx     SOCIAL HISTORY:   Social History   Socioeconomic History   Marital status: Single    Spouse name: Not on file   Number of children: Not on file   Years of education: Not on file   Highest education level: Not on file  Occupational History   Not on file  Tobacco Use   Smoking status: Every Day    Current packs/day: 0.25    Average packs/day: 0.3 packs/day for 12.0 years (3.0 ttl pk-yrs)    Types: Cigarettes    Smokeless tobacco: Never  Vaping Use   Vaping status: Never Used  Substance and Sexual Activity   Alcohol use: Yes    Alcohol/week: 5.0 standard drinks of alcohol    Types: 2 Cans of beer, 3 Shots of liquor per week    Comment: couple shots every 3 days.    Drug use: Not Currently    Types: Marijuana, Cocaine    Comment: Last used Marijuana in 2021. Last use cocaine in 2020.    Sexual activity: Yes  Other Topics Concern   Not on file  Social History Narrative   Not on file   Social Determinants of Health   Financial Resource Strain: Not on file  Food Insecurity: Not on file  Transportation Needs: Not on file  Physical Activity: Not on file  Stress: Not on file  Social Connections: Not on file  Intimate Partner Violence: Not on file    ALLERGIES:    No Known Allergies  CURRENT MEDICATIONS:    Current Outpatient Medications  Medication Sig Dispense Refill   allopurinol (ZYLOPRIM) 100 MG tablet Take 100 mg by mouth daily as needed (gout).      amLODipine (NORVASC) 10 MG tablet Take 1 tablet (10 mg total) by mouth daily. 30 tablet 2   atorvastatin (LIPITOR) 10 MG tablet Take 10  mg by mouth daily.     colchicine 0.6 MG tablet TAKE 1 TABLET DAILY. PLEASE ATTEND SCHEDULED APPOINTMENT FOR ADDITIONAL REFILLS. 30 tablet 0   doxycycline (VIBRA-TABS) 100 MG tablet Take 1 tablet (100 mg total) by mouth 2 (two) times daily. 10 tablet 0   DULoxetine (CYMBALTA) 30 MG capsule Take 30 mg by mouth daily. (Patient not taking: Reported on 07/27/2022)     furosemide (LASIX) 20 MG tablet Take 1 tablet (20 mg total) by mouth daily as needed for edema. 90 tablet 3   gabapentin (NEURONTIN) 600 MG tablet Take 1 tablet (600 mg total) by mouth 2 (two) times daily as needed.     hydrALAZINE (APRESOLINE) 10 MG tablet Take 1 tablet (10 mg total) by mouth every 8 (eight) hours. 90 tablet 2   Indomethacin 20 MG CAPS Take 1 capsule by mouth 3 (three) times daily as needed. 21 capsule 0   metoprolol  tartrate (LOPRESSOR) 50 MG tablet Take 1 tablet (50 mg total) by mouth 2 (two) times daily. 180 tablet 3   pantoprazole (PROTONIX) 40 MG tablet Take 1 tablet (40 mg total) by mouth 2 (two) times daily before a meal. (Patient not taking: Reported on 07/27/2022) 180 tablet 0   potassium chloride (KLOR-CON) 10 MEQ tablet Take 20 meq daily for 3 days then reduce to 10 meq Daily 90 tablet 3   No current facility-administered medications for this visit.    REVIEW OF SYSTEMS:   [X]  denotes positive finding, [ ]  denotes negative finding Cardiac  Comments:  Chest pain or chest pressure:    Shortness of breath upon exertion:    Short of breath when lying flat:    Irregular heart rhythm:        Vascular    Pain in calf, thigh, or hip brought on by ambulation:    Pain in feet at night that wakes you up from your sleep:     Blood clot in your veins:    Leg swelling:  x       Pulmonary    Oxygen at home:    Productive cough:     Wheezing:         Neurologic    Sudden weakness in arms or legs:     Sudden numbness in arms or legs:     Sudden onset of difficulty speaking or slurred speech:    Temporary loss of vision in one eye:     Problems with dizziness:         Gastrointestinal    Blood in stool:      Vomited blood:         Genitourinary    Burning when urinating:     Blood in urine:        Psychiatric    Major depression:         Hematologic    Bleeding problems:    Problems with blood clotting too easily:        Skin    Rashes or ulcers:        Constitutional    Fever or chills:     PHYSICAL EXAM:   There were no vitals filed for this visit.  GENERAL: The patient is a well-nourished male, in no acute distress. The vital signs are documented above. CARDIAC: There is a regular rate and rhythm.  VASCULAR: Palpable radial pulse PULMONARY: Nonlabored respiration MUSCULOSKELETAL: There are no major deformities or cyanosis. NEUROLOGIC: No focal weakness or paresthesias  are  detected. SKIN: There are no ulcers or rashes noted. PSYCHIATRIC: The patient has a normal affect.  STUDIES:   I have reviewed the following:  +-----------------+-------------+----------+---------+  Right Cephalic   Diameter (cm)Depth (cm)Findings   +-----------------+-------------+----------+---------+  Shoulder            0.64                          +-----------------+-------------+----------+---------+  Prox upper arm       0.60                          +-----------------+-------------+----------+---------+  Mid upper arm        0.42                          +-----------------+-------------+----------+---------+  Dist upper arm       0.37                          +-----------------+-------------+----------+---------+  Antecubital fossa    0.63                          +-----------------+-------------+----------+---------+  Prox forearm      0.37 / 0.24           branching  +-----------------+-------------+----------+---------+  Mid forearm          0.23                          +-----------------+-------------+----------+---------+  Dist forearm         0.20                          +-----------------+-------------+----------+---------+  Wrist               0.19                          +-----------------+-------------+----------+---------+   +-----------------+-------------+----------+---------+  Right Basilic    Diameter (cm)Depth (cm)Findings   +-----------------+-------------+----------+---------+  Prox upper arm    0.55 / 0.48           branching  +-----------------+-------------+----------+---------+  Mid upper arm        0.56                          +-----------------+-------------+----------+---------+  Dist upper arm       0.32                          +-----------------+-------------+----------+---------+  Antecubital fossa    0.39                           +-----------------+-------------+----------+---------+  Prox forearm         0.20                          +-----------------+-------------+----------+---------+   +-----------------+-------------+----------+--------+  Left Cephalic    Diameter (cm)Depth (cm)Findings  +-----------------+-------------+----------+--------+  Shoulder            0.42                         +-----------------+-------------+----------+--------+  Prox upper arm       0.43                         +-----------------+-------------+----------+--------+  Mid upper arm        0.25                         +-----------------+-------------+----------+--------+  Dist upper arm       0.31                         +-----------------+-------------+----------+--------+  Antecubital fossa    0.37                         +-----------------+-------------+----------+--------+  Prox forearm         0.32                         +-----------------+-------------+----------+--------+  Mid forearm          0.19                         +-----------------+-------------+----------+--------+  Dist forearm      0.19 / 0.20                     +-----------------+-------------+----------+--------+   +-----------------+-------------+----------+---------+  Left Basilic     Diameter (cm)Depth (cm)Findings   +-----------------+-------------+----------+---------+  Prox upper arm       0.41                          +-----------------+-------------+----------+---------+  Mid upper arm        0.40               branching  +-----------------+-------------+----------+---------+  Dist upper arm    0.32 / 0.36                      +-----------------+-------------+----------+---------+  Antecubital fossa 0.33 / 0.26                      +-----------------+-------------+----------+---------+  Prox forearm         0.15                           +-----------------+-------------+----------+---------+  ASSESSMENT and PLAN   Stage IV renal disease: The patient is right-handed.  I discussed with the patient and his daughter that we would proceed with a right arm fistula.  I discussed the details of the operation as well as the risks and benefits including the need for additional procedures and the risk of steal.  All questions were answered.   Charlena Cross, MD, FACS [facility] [phone] Pager [phone]

## 2023-08-09 ENCOUNTER — Ambulatory Visit: Payer: Medicaid Other | Admitting: Internal Medicine

## 2023-08-10 ENCOUNTER — Encounter: Payer: Self-pay | Admitting: *Deleted

## 2023-08-10 ENCOUNTER — Ambulatory Visit: Payer: Self-pay | Admitting: Student

## 2023-08-12 NOTE — Progress Notes (Signed)
  Cardiology Office Note:  .   Date:  08/16/2023  ID:  Joshua Maldonado, DOB [birth year], MRN [id number] PCP: Maggie Font, NP  [facility] Providers Cardiologist:  Marjo Bicker, MD    History of Present Illness: .   Joshua Maldonado is a 67 y.o. male  with history of HTN, HLD, paroxysmal SVT in the setting of acute pancreatitis in 2021, nicotine abuse, OSA on CPAP, CKD.   Patient comes in for yearly f/u. Drives a truck long haul. No chest pain, palpitations. Has occasional swelling and take furosemide. smokes 1/4 ppd, trying to quit. No regular exercise.   ROS:    Studies Reviewed: [name]    EKG Interpretation Date/Time:  Monday August 16 2023 13:01:46 EST Ventricular Rate:  60 PR Interval:  160 QRS Duration:  86 QT Interval:  446 QTC Calculation: 446 R Axis:   34  Text Interpretation: Normal sinus rhythm Normal ECG When compared with ECG of 03-Sep-2020 08:39, PREVIOUS ECG IS PRESENT Confirmed by Jacolyn Reedy [phone]) on 08/16/2023 1:04:46 PM    Prior CV Studies:   Echo in 2021  1. Left ventricular ejection fraction, by estimation, is 60 to 65%. The  left ventricle has normal function. The left ventricle has no regional  wall motion abnormalities. There is moderate left ventricular hypertrophy.  Left ventricular diastolic  parameters are indeterminate.   2. Right ventricular systolic function is normal. The right ventricular  size is normal.   3. The mitral valve is grossly normal. Trivial mitral valve  regurgitation.   4. The aortic valve is tricuspid. Aortic valve regurgitation is not  visualized.   5. Aortic dilatation noted. There is mild dilatation of the aortic root,  measuring 39 mm.   6. The inferior vena cava is normal in size with greater than 50%  respiratory variability, suggesting right atrial pressure of 3 mmHg.      Risk Assessment/Calculations:             Physical Exam:   VS:  BP 112/76   Pulse 61   Ht 5\' 7"  (1.702 m)   Wt 195 lb 9.6  oz (88.7 kg)   SpO2 95%   BMI 30.64 kg/m    Wt Readings from Last 3 Encounters:  08/16/23 195 lb 9.6 oz (88.7 kg)  08/02/23 198 lb (89.8 kg)  02/01/23 201 lb (91.2 kg)    GEN: Well nourished, well developed in no acute distress NECK: Bilateral carotid bruits.  No JVD;   CARDIAC:  RRR, no murmurs, rubs, gallops RESPIRATORY: decreased breath sounds with scattered rhonchi and wheezing ABDOMEN: Soft, non-tender, non-distended EXTREMITIES:  No edema; No deformity   ASSESSMENT AND PLAN: .    SVT in the setting of acute pancreatitis on metoprolol-no symptoms  HTN-well controlled  COPD/Nicotine abuse-smoking cessation discussed-refer to pulmonary  CKD stage V for AV fistula. Crt 5.4 07/05/23  Bilateral carotid bruits -check dopplers       Dispo: f/u in 1 yr.  Signed, Jacolyn Reedy, PA-C

## 2023-08-16 ENCOUNTER — Encounter: Payer: Self-pay | Admitting: Physician Assistant

## 2023-08-16 ENCOUNTER — Ambulatory Visit: Payer: 59 | Attending: Physician Assistant | Admitting: Physician Assistant

## 2023-08-16 VITALS — BP 112/76 | HR 61 | Ht 67.0 in | Wt 195.6 lb

## 2023-08-16 DIAGNOSIS — I471 Supraventricular tachycardia, unspecified: Secondary | ICD-10-CM

## 2023-08-16 DIAGNOSIS — Z720 Tobacco use: Secondary | ICD-10-CM | POA: Diagnosis not present

## 2023-08-16 DIAGNOSIS — R0989 Other specified symptoms and signs involving the circulatory and respiratory systems: Secondary | ICD-10-CM

## 2023-08-16 DIAGNOSIS — J449 Chronic obstructive pulmonary disease, unspecified: Secondary | ICD-10-CM

## 2023-08-16 DIAGNOSIS — I10 Essential (primary) hypertension: Secondary | ICD-10-CM | POA: Diagnosis not present

## 2023-08-16 DIAGNOSIS — N185 Chronic kidney disease, stage 5: Secondary | ICD-10-CM

## 2023-08-16 NOTE — Patient Instructions (Addendum)
Medication Instructions:  Your physician recommends that you continue on your current medications as directed. Please refer to the Current Medication list given to you today.   Labwork: None today  Testing/Procedures: Your physician has requested that you have a carotid duplex. This test is an ultrasound of the carotid arteries in your neck. It looks at blood flow through these arteries that supply the brain with blood. Allow one hour for this exam. There are no restrictions or special instructions.   Follow-Up: 1 year Dr.Mallipeddi  Any Other Special Instructions Will Be Listed Below (If Applicable).   You have been referred to pulmonary, they will call you to schedule an appointment     Please exercise 30  minutes a day, 5 days a week   If you need a refill on your cardiac medications before your next appointment, please call your pharmacy.

## 2023-08-18 ENCOUNTER — Other Ambulatory Visit: Payer: Self-pay

## 2023-08-18 DIAGNOSIS — N184 Chronic kidney disease, stage 4 (severe): Secondary | ICD-10-CM

## 2023-08-30 ENCOUNTER — Ambulatory Visit (HOSPITAL_COMMUNITY): Payer: 59

## 2023-09-20 ENCOUNTER — Ambulatory Visit (INDEPENDENT_AMBULATORY_CARE_PROVIDER_SITE_OTHER): Payer: 59 | Admitting: Urology

## 2023-09-20 ENCOUNTER — Encounter: Payer: Self-pay | Admitting: Urology

## 2023-09-20 ENCOUNTER — Telehealth: Payer: Self-pay

## 2023-09-20 VITALS — BP 103/71 | HR 72

## 2023-09-20 DIAGNOSIS — N5201 Erectile dysfunction due to arterial insufficiency: Secondary | ICD-10-CM

## 2023-09-20 DIAGNOSIS — Z125 Encounter for screening for malignant neoplasm of prostate: Secondary | ICD-10-CM

## 2023-09-20 MED ORDER — SILDENAFIL CITRATE 20 MG PO TABS: ORAL_TABLET | ORAL | 11 refills | Status: AC

## 2023-09-20 NOTE — Telephone Encounter (Signed)
PA started on Aidenafil Citrate 20 MG ([id number])

## 2023-09-20 NOTE — Progress Notes (Signed)
09/20/2023 10:11 AM   [name] Feb 09, 1956 [id number]  Referring provider: Maggie Font, NP [phone], [address] [place],  [place] [address]  No chief complaint on file.   HPI:  New pt -   1) ED - he had issues getting and maintaining erection since 2022. He tried sildenafil in the past. Worked well. He had two kids. He has a good libido.   IPSS 11. No voiding complaints.   Assoc with hep c, cirrhosis, a fib, CKD (cr 3.3, gfr 20, prior renal US benign). No NTG use.   He is a Hospital doctor. Long distance. Wife passed two years ago.    PMH: Past Medical History:  Diagnosis Date   Cirrhosis [facility])    Per patient, diagnosed at [facility].  Reports liver biopsy at [facility].  Suspected to be secondary to hepatitis C.and ETOH.    CKD (chronic kidney disease)    Dilation of biliary tract    Dilated CBD. MRI May 2021 suggesting benign variation.   GERD (gastroesophageal reflux disease)    Gout    Hepatitis C    Per patient, s/p treatment with Harvoni at [facility]; hep C RNA not detected April 2021.   HLD (hyperlipidemia)    Hypertension    LVH (left ventricular hypertrophy)    Moderate, normal EF.   Paroxysmal SVT (supraventricular tachycardia) (HCC)    Sleep apnea     Surgical History: Past Surgical History:  Procedure Laterality Date   BACK SURGERY     LIVER BIOPSY     Per patient- at [facility] Medications:  Allergies as of 09/20/2023   No Known Allergies      Medication List        Accurate as of September 20, 2023 10:11 AM. If you have any questions, ask your nurse or doctor.          allopurinol 100 MG tablet Commonly known as: ZYLOPRIM Take 100 mg by mouth daily as needed (gout).   amLODipine 10 MG tablet Commonly known as: NORVASC Take 1 tablet (10 mg total) by mouth daily.   atorvastatin 10 MG tablet Commonly known as: LIPITOR Take 10 mg by mouth daily.   colchicine 0.6 MG tablet TAKE 1 TABLET DAILY. PLEASE  ATTEND SCHEDULED APPOINTMENT FOR ADDITIONAL REFILLS.   doxycycline 100 MG tablet Commonly known as: VIBRA-TABS Take 1 tablet (100 mg total) by mouth 2 (two) times daily.   DULoxetine 30 MG capsule Commonly known as: CYMBALTA Take 30 mg by mouth daily.   furosemide 20 MG tablet Commonly known as: LASIX Take 1 tablet (20 mg total) by mouth daily as needed for edema.   gabapentin 600 MG tablet Commonly known as: NEURONTIN Take 1 tablet (600 mg total) by mouth 2 (two) times daily as needed.   hydrALAZINE 10 MG tablet Commonly known as: APRESOLINE Take 1 tablet (10 mg total) by mouth every 8 (eight) hours.   Indomethacin 20 MG Caps Take 1 capsule by mouth 3 (three) times daily as needed.   metoprolol tartrate 50 MG tablet Commonly known as: LOPRESSOR Take 1 tablet (50 mg total) by mouth 2 (two) times daily.   pantoprazole 40 MG tablet Commonly known as: PROTONIX Take 1 tablet (40 mg total) by mouth 2 (two) times daily before a meal.   potassium chloride 10 MEQ tablet Commonly known as: KLOR-CON Take 20 meq daily for 3 days then reduce to 10 meq Daily  Allergies: No Known Allergies  Family History: Family History  Problem Relation Age of Onset   Hypertension Father    Colon cancer Neg Hx     Social History:  reports that he has been smoking cigarettes. He has a 3 pack-year smoking history. He has never used smokeless tobacco. He reports current alcohol use of about 5.0 standard drinks of alcohol per week. He reports that he does not currently use drugs after having used the following drugs: Marijuana and Cocaine.   Physical Exam: BP 103/71   Pulse 72   Constitutional:  Alert and oriented, No acute distress. HEENT: [place] AT, moist mucus membranes.  Trachea midline, no masses. Cardiovascular: No clubbing, cyanosis, or edema. Respiratory: Normal respiratory effort, no increased work of breathing. GI: Abdomen is soft, nontender, nondistended, no abdominal  masses GU: No CVA tenderness Skin: No rashes, bruises or suspicious lesions. Neurologic: Grossly intact, no focal deficits, moving all 4 extremities. Psychiatric: Normal mood and affect.  Laboratory Data: Lab Results  Component Value Date   WBC 6.7 02/01/2023   HGB 14.3 02/01/2023   HCT 43.0 02/01/2023   MCV 92.9 02/01/2023   PLT 161 02/01/2023    Lab Results  Component Value Date   CREATININE 2.58 (H) 05/26/2021    No results found for: "PSA"  No results found for: "TESTOSTERONE"  No results found for: "HGBA1C"  Urinalysis    Component Value Date/Time   COLORURINE YELLOW 09/02/2020 1201   APPEARANCEUR CLEAR 09/02/2020 1201   LABSPEC 1.015 09/02/2020 1201   PHURINE 8.0 09/02/2020 1201   GLUCOSEU NEGATIVE 09/02/2020 1201   HGBUR NEGATIVE 09/02/2020 1201   BILIRUBINUR NEGATIVE 09/02/2020 1201   KETONESUR NEGATIVE 09/02/2020 1201   PROTEINUR >=300 (A) 09/02/2020 1201   NITRITE NEGATIVE 09/02/2020 1201   LEUKOCYTESUR NEGATIVE 09/02/2020 1201    Lab Results  Component Value Date   BACTERIA NONE SEEN 09/02/2020    Pertinent Imaging:  No results found for this or any previous visit.  Results for orders placed during the hospital encounter of 04/25/21  US Venous Img Lower Bilateral (DVT)  Narrative CLINICAL DATA:  Bilateral lower extremity pain and edema for the past 1-2 months, right greater than left. Evaluate for DVT.  EXAM: BILATERAL LOWER EXTREMITY VENOUS DOPPLER ULTRASOUND  TECHNIQUE: Gray-scale sonography with graded compression, as well as color Doppler and duplex ultrasound were performed to evaluate the lower extremity deep venous systems from the level of the common femoral vein and including the common femoral, femoral, profunda femoral, popliteal and calf veins including the posterior tibial, peroneal and gastrocnemius veins when visible. The superficial great saphenous vein was also interrogated. Spectral Doppler was utilized to evaluate  flow at rest and with distal augmentation maneuvers in the common femoral, femoral and popliteal veins.  COMPARISON:  None.  FINDINGS: RIGHT LOWER EXTREMITY  Common Femoral Vein: No evidence of thrombus. Normal compressibility, respiratory phasicity and response to augmentation.  Saphenofemoral Junction: No evidence of thrombus. Normal compressibility and flow on color Doppler imaging.  Profunda Femoral Vein: No evidence of thrombus. Normal compressibility and flow on color Doppler imaging.  Femoral Vein: No evidence of thrombus. Normal compressibility, respiratory phasicity and response to augmentation.  Popliteal Vein: No evidence of thrombus. Normal compressibility, respiratory phasicity and response to augmentation.  Calf Veins: No evidence of thrombus. Normal compressibility and flow on color Doppler imaging.  Superficial Great Saphenous Vein: No evidence of thrombus. Normal compressibility.  Venous Reflux:  None.  Other Findings:  None.  LEFT  LOWER EXTREMITY  Common Femoral Vein: No evidence of thrombus. Normal compressibility, respiratory phasicity and response to augmentation.  Saphenofemoral Junction: No evidence of thrombus. Normal compressibility and flow on color Doppler imaging.  Profunda Femoral Vein: No evidence of thrombus. Normal compressibility and flow on color Doppler imaging.  Femoral Vein: No evidence of thrombus. Normal compressibility, respiratory phasicity and response to augmentation.  Popliteal Vein: No evidence of thrombus. Normal compressibility, respiratory phasicity and response to augmentation.  Calf Veins: No evidence of thrombus. Normal compressibility and flow on color Doppler imaging.  Superficial Great Saphenous Vein: No evidence of thrombus. Normal compressibility.  Venous Reflux:  None.  Other Findings:  None.  IMPRESSION: No evidence of DVT within either lower extremity.   Electronically Signed By: Simonne Come  M.D. On: 04/25/2021 15:04  No results found for this or any previous visit.  No results found for this or any previous visit.  Results for orders placed during the hospital encounter of 07/14/21  US RENAL  Narrative CLINICAL DATA:  Stage IV renal insufficiency  EXAM: RENAL / URINARY TRACT ULTRASOUND COMPLETE  COMPARISON:  09/04/2020  FINDINGS: Right Kidney:  Renal measurements: 9.8 x 4.7 x 5.9 cm = volume: 142 mL. Diffuse increased renal cortical echotexture. Simple cyst upper pole measuring 1.6 cm. No hydronephrosis.  Left Kidney:  Renal measurements: 9.7 x 5.4 x 5.5 cm = volume: 151 mL. Diffuse increased renal cortical echotexture. No hydronephrosis or renal mass.  Bladder:  Appears normal for degree of bladder distention.  Other:  None.  IMPRESSION: 1. Bilateral increased renal cortical echotexture consistent with medical renal disease. Otherwise unremarkable exam.   Electronically Signed By: Sharlet Salina M.D. On: 07/15/2021 21:23   Assessment & Plan:    ED -I discussed with Jeannett Senior the etiology of the ED.  We discussed the nature risk benefits of PDE 5 inhibitors, injections, VED and constriction bands.  All questions answered.  He will try sildenafil again.  We discussed the relationship between sildenafil and the cardiovascular health, nitroglycerin etc. PSA was sent. Discussed PSA / PCa screening.   No follow-ups on file.  Jerilee Field, MD  [facility]  [address] [place], [place] [address] [phone]

## 2023-09-21 LAB — PSA: Prostate Specific Ag, Serum: 1.2 ng/mL (ref 0.0–4.0)

## 2023-09-27 ENCOUNTER — Ambulatory Visit (HOSPITAL_COMMUNITY): Payer: 59

## 2023-10-01 ENCOUNTER — Ambulatory Visit (HOSPITAL_COMMUNITY)
Admission: RE | Admit: 2023-10-01 | Discharge: 2023-10-01 | Disposition: A | Discharge status: Home / Self Care | Payer: 59 | Source: Ambulatory Visit | Attending: Physician Assistant | Admitting: Physician Assistant

## 2023-10-01 DIAGNOSIS — R0989 Other specified symptoms and signs involving the circulatory and respiratory systems: Secondary | ICD-10-CM | POA: Insufficient documentation

## 2023-10-12 ENCOUNTER — Telehealth: Payer: Self-pay

## 2023-10-12 NOTE — Telephone Encounter (Signed)
 Pts daughter called requesting to move patient's surgery out due to patient was not ready and will not be in town on 10/15/23. Surgery rescheduled for 12/09/23. Instructions reviewed and she voiced understanding.

## 2023-10-18 ENCOUNTER — Ambulatory Visit: Payer: Self-pay | Admitting: Medical

## 2023-12-07 ENCOUNTER — Other Ambulatory Visit: Payer: Self-pay

## 2023-12-08 ENCOUNTER — Other Ambulatory Visit: Payer: Self-pay

## 2023-12-08 ENCOUNTER — Encounter (HOSPITAL_COMMUNITY): Payer: Self-pay | Admitting: Surgery

## 2023-12-08 NOTE — Anesthesia Preprocedure Evaluation (Addendum)
 Anesthesia Evaluation  Patient identified by MRN, date of birth, ID band Patient awake    Reviewed: Allergy & Precautions, NPO status , Patient's Chart, lab work & pertinent test results, reviewed documented beta blocker date and time   Airway Mallampati: III  TM Distance: >3 FB Neck ROM: Full    Dental  (+) Chipped, Missing, Dental Advisory Given   Pulmonary sleep apnea , Current Smoker and Patient abstained from smoking.   Pulmonary exam normal breath sounds clear to auscultation       Cardiovascular hypertension, Pt. on medications and Pt. on home beta blockers Normal cardiovascular exam+ dysrhythmias Supra Ventricular Tachycardia  Rhythm:Regular Rate:Normal  TTE 09/04/2020: 1. Left ventricular ejection fraction, by estimation, is 60 to 65%. The  left ventricle has normal function. The left ventricle has no regional  wall motion abnormalities. There is moderate left ventricular hypertrophy.  Left ventricular diastolic  parameters are indeterminate.   2. Right ventricular systolic function is normal. The right ventricular  size is normal.   3. The mitral valve is grossly normal. Trivial mitral valve  regurgitation.   4. The aortic valve is tricuspid. Aortic valve regurgitation is not  visualized.   5. Aortic dilatation noted. There is mild dilatation of the aortic root,  measuring 39 mm.   6. The inferior vena cava is normal in size with greater than 50%  respiratory variability, suggesting right atrial pressure of 3 mmHg    Neuro/Psych negative neurological ROS  negative psych ROS   GI/Hepatic ,GERD  ,,(+) Cirrhosis     substance abuse  alcohol use, Hepatitis -, C  Endo/Other  negative endocrine ROS    Renal/GU CRFRenal disease  negative genitourinary   Musculoskeletal negative musculoskeletal ROS (+)    Abdominal   Peds  Hematology negative hematology ROS (+)   Anesthesia Other Findings 68 year old male  with pertinent history including HTN, HLD, chronic hepatitis C s/p treatment with Harvoni, GERD, PSVT in the setting of acute pancreatitis in 2021, OSA on CPAP, current smoker, CKD, history of cocaine abuse (reportedly 10/2018)  Reproductive/Obstetrics                             Anesthesia Physical Anesthesia Plan  ASA: 3  Anesthesia Plan: MAC and Regional   Post-op Pain Management: Regional block*   Induction: Intravenous  PONV Risk Score and Plan: 1 and Propofol infusion, Treatment may vary due to age or medical condition, Midazolam, Ondansetron and Dexamethasone  Airway Management Planned: Natural Airway  Additional Equipment:   Intra-op Plan:   Post-operative Plan:   Informed Consent: I have reviewed the patients History and Physical, chart, labs and discussed the procedure including the risks, benefits and alternatives for the proposed anesthesia with the patient or authorized representative who has indicated his/her understanding and acceptance.     Dental advisory given  Plan Discussed with: CRNA  Anesthesia Plan Comments: ( )        Anesthesia Quick Evaluation

## 2023-12-08 NOTE — Progress Notes (Signed)
 SDW call  Patient was given pre-op instructions over the phone. Patient verbalized understanding of instructions provided.     PCP - Maggie Font, NP Cardiologist - Dr. Luane School Pulmonary:    PPM/ICD - denies Device Orders - na Rep Notified - na   Chest x-ray - na EKG -  08/16/2023 Stress Test - ECHO - 09/04/2020 Cardiac Cath -   Sleep Study/sleep apnea/CPAP: Dx with sleep apnea, states he uses his CPAP sometimes.  Non-diabetic  Blood Thinner Instructions: denies Aspirin Instructions:denies   ERAS Protcol - NPO  Anesthesia review: Yes. OSA w/CPAP, HTN, HLD, SVT   Patient denies shortness of breath, fever, cough and chest pain over the phone call  Your procedure is scheduled on Thursday December 09, 2023  Report to [facility] Main Entrance "A" at  0530  A.M., then check in with the Admitting office.  Call this number if you have problems the morning of surgery:  [phone]   If you have any questions prior to your surgery date call [phone]: Open Monday-Friday 8am-4pm If you experience any cold or flu symptoms such as cough, fever, chills, shortness of breath, etc. between now and your scheduled surgery, please notify us at the above number    Remember:  Do not eat or drink after midnight the night before your surgery  Take these medicines the morning of surgery with A SIP OF WATER:  Lipitor, colchicine, hydralazine, metoprolol  As needed: Allopurinol, gabapentin, indomethacin  As of today, STOP taking any Aspirin (unless otherwise instructed by your surgeon) Aleve, Naproxen, Ibuprofen, Motrin, Advil, Goody's, BC's, all herbal medications, fish oil, and all vitamins.

## 2023-12-08 NOTE — Progress Notes (Signed)
 Anesthesia Chart Review: Same-day workup  68 year old male with pertinent history including HTN, HLD, chronic hepatitis C s/p treatment with Harvoni, GERD, PSVT in the setting of acute pancreatitis in 2021, OSA on CPAP, current smoker, CKD, history of cocaine abuse (reportedly 10/2018).    Echo 09/2020 showed EF 60 to 65%, moderate LVH, normal RV function, no significant valvular abnormalities.  He follows annually with cardiology.  Last seen by Wynell Balloon, PA-C on 08/16/2023.  At that time his hypertension was noted to be well-controlled, no palpitations, discussed that he would likely be proceeding with AV fistula for progression of CKD.  Carotid Dopplers were ordered to evaluate bruit, done 10/01/2023 and showed minimal disease bilaterally.  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  He recently had peritoneal dialysis catheter placed 11/29/2023 at [facility].  He will need day of surgery labs and evaluation.  EKG 08/16/2023: NSR.  Rate 60.  TTE 09/04/2020: 1. Left ventricular ejection fraction, by estimation, is 60 to 65%. The  left ventricle has normal function. The left ventricle has no regional  wall motion abnormalities. There is moderate left ventricular hypertrophy.  Left ventricular diastolic  parameters are indeterminate.   2. Right ventricular systolic function is normal. The right ventricular  size is normal.   3. The mitral valve is grossly normal. Trivial mitral valve  regurgitation.   4. The aortic valve is tricuspid. Aortic valve regurgitation is not  visualized.   5. Aortic dilatation noted. There is mild dilatation of the aortic root,  measuring 39 mm.   6. The inferior vena cava is normal in size with greater than 50%  respiratory variability, suggesting right atrial pressure of 3 mmHg.     [name] [facility] Short Stay Center/Anesthesiology Phone [phone] 12/08/2023 8:53 AM

## 2023-12-09 ENCOUNTER — Other Ambulatory Visit (HOSPITAL_COMMUNITY): Payer: Self-pay

## 2023-12-09 ENCOUNTER — Ambulatory Visit (HOSPITAL_COMMUNITY)
Admission: RE | Admit: 2023-12-09 | Discharge: 2023-12-09 | Disposition: A | Discharge status: Home / Self Care | Payer: 59 | Attending: Surgery | Admitting: Surgery

## 2023-12-09 ENCOUNTER — Encounter (HOSPITAL_COMMUNITY): Payer: Self-pay | Admitting: Surgery

## 2023-12-09 ENCOUNTER — Other Ambulatory Visit: Payer: Self-pay

## 2023-12-09 ENCOUNTER — Ambulatory Visit (HOSPITAL_BASED_OUTPATIENT_CLINIC_OR_DEPARTMENT_OTHER): Admitting: Physician Assistant

## 2023-12-09 ENCOUNTER — Ambulatory Visit (HOSPITAL_COMMUNITY): Admitting: Physician Assistant

## 2023-12-09 ENCOUNTER — Encounter (HOSPITAL_COMMUNITY)
Admission: RE | Disposition: A | Discharge status: Home / Self Care | Payer: Self-pay | Source: Home / Self Care | Attending: Surgery

## 2023-12-09 DIAGNOSIS — N184 Chronic kidney disease, stage 4 (severe): Secondary | ICD-10-CM | POA: Diagnosis not present

## 2023-12-09 DIAGNOSIS — K219 Gastro-esophageal reflux disease without esophagitis: Secondary | ICD-10-CM | POA: Diagnosis not present

## 2023-12-09 DIAGNOSIS — N185 Chronic kidney disease, stage 5: Secondary | ICD-10-CM

## 2023-12-09 DIAGNOSIS — Z79899 Other long term (current) drug therapy: Secondary | ICD-10-CM | POA: Insufficient documentation

## 2023-12-09 DIAGNOSIS — N189 Chronic kidney disease, unspecified: Secondary | ICD-10-CM | POA: Diagnosis not present

## 2023-12-09 DIAGNOSIS — G4730 Sleep apnea, unspecified: Secondary | ICD-10-CM | POA: Diagnosis not present

## 2023-12-09 DIAGNOSIS — K7460 Unspecified cirrhosis of liver: Secondary | ICD-10-CM | POA: Diagnosis not present

## 2023-12-09 DIAGNOSIS — G4733 Obstructive sleep apnea (adult) (pediatric): Secondary | ICD-10-CM

## 2023-12-09 DIAGNOSIS — I129 Hypertensive chronic kidney disease with stage 1 through stage 4 chronic kidney disease, or unspecified chronic kidney disease: Secondary | ICD-10-CM

## 2023-12-09 DIAGNOSIS — I472 Ventricular tachycardia, unspecified: Secondary | ICD-10-CM | POA: Insufficient documentation

## 2023-12-09 DIAGNOSIS — F17210 Nicotine dependence, cigarettes, uncomplicated: Secondary | ICD-10-CM | POA: Insufficient documentation

## 2023-12-09 DIAGNOSIS — E7800 Pure hypercholesterolemia, unspecified: Secondary | ICD-10-CM | POA: Diagnosis not present

## 2023-12-09 DIAGNOSIS — I517 Cardiomegaly: Secondary | ICD-10-CM | POA: Diagnosis not present

## 2023-12-09 HISTORY — PX: AV FISTULA PLACEMENT: SHX1204

## 2023-12-09 LAB — POCT I-STAT, CHEM 8
BUN: 59 mg/dL — ABNORMAL HIGH (ref 8–23)
Calcium, Ion: 1.88 mmol/L (ref 1.15–1.40)
Chloride: 113 mmol/L — ABNORMAL HIGH (ref 98–111)
Creatinine, Ser: 5.8 mg/dL — ABNORMAL HIGH (ref 0.61–1.24)
Glucose, Bld: 72 mg/dL (ref 70–99)
HCT: 38 % — ABNORMAL LOW (ref 39.0–52.0)
Hemoglobin: 12.9 g/dL — ABNORMAL LOW (ref 13.0–17.0)
Potassium: 4.1 mmol/L (ref 3.5–5.1)
Sodium: 143 mmol/L (ref 135–145)
TCO2: 24 mmol/L (ref 22–32)

## 2023-12-09 SURGERY — ARTERIOVENOUS (AV) FISTULA CREATION
Anesthesia: Monitor Anesthesia Care | Site: Arm Upper | Laterality: Right

## 2023-12-09 MED ORDER — MIDAZOLAM HCL 5 MG/5ML IJ SOLN
INTRAMUSCULAR | Status: DC | PRN
  Administered 2023-12-09 (×2): 1 mg via INTRAVENOUS

## 2023-12-09 MED ORDER — PHENYLEPHRINE HCL-NACL 20-0.9 MG/250ML-% IV SOLN
INTRAVENOUS | Status: DC | PRN
Start: 2023-12-09 — End: 2023-12-09
  Administered 2023-12-09: 35 ug/min via INTRAVENOUS

## 2023-12-09 MED ORDER — SODIUM CHLORIDE 0.9 % IV SOLN: INTRAVENOUS | Status: DC | PRN

## 2023-12-09 MED ORDER — FENTANYL CITRATE (PF) 250 MCG/5ML IJ SOLN
INTRAMUSCULAR | Status: AC
  Filled 2023-12-09: qty 5

## 2023-12-09 MED ORDER — CHLORHEXIDINE GLUCONATE 0.12 % MT SOLN: 15.0000 mL | Freq: Once | OROMUCOSAL | Status: AC

## 2023-12-09 MED ORDER — CHLORHEXIDINE GLUCONATE 0.12 % MT SOLN
OROMUCOSAL | Status: AC
  Administered 2023-12-09: 15 mL via OROMUCOSAL
  Filled 2023-12-09: qty 15

## 2023-12-09 MED ORDER — 0.9 % SODIUM CHLORIDE (POUR BTL) OPTIME
TOPICAL | Status: DC | PRN
  Administered 2023-12-09: 1000 mL

## 2023-12-09 MED ORDER — SODIUM CHLORIDE 0.9 % IV SOLN: INTRAVENOUS | Status: DC

## 2023-12-09 MED ORDER — HEPARIN 6000 UNIT IRRIGATION SOLUTION
Status: DC | PRN
  Administered 2023-12-09: 1

## 2023-12-09 MED ORDER — ONDANSETRON HCL 4 MG/2ML IJ SOLN
INTRAMUSCULAR | Status: DC | PRN
  Administered 2023-12-09: 4 mg via INTRAVENOUS

## 2023-12-09 MED ORDER — FENTANYL CITRATE (PF) 250 MCG/5ML IJ SOLN
INTRAMUSCULAR | Status: DC | PRN
  Administered 2023-12-09 (×2): 50 ug via INTRAVENOUS

## 2023-12-09 MED ORDER — PROPOFOL 500 MG/50ML IV EMUL
INTRAVENOUS | Status: DC | PRN
  Administered 2023-12-09: 50 ug/kg/min via INTRAVENOUS

## 2023-12-09 MED ORDER — SURGIFLO WITH THROMBIN (HEMOSTATIC MATRIX KIT) OPTIME
TOPICAL | Status: DC | PRN
  Administered 2023-12-09: 1 via TOPICAL

## 2023-12-09 MED ORDER — CEFAZOLIN SODIUM-DEXTROSE 2-4 GM/100ML-% IV SOLN
2.0000 g | INTRAVENOUS | Status: AC
  Administered 2023-12-09: 2 g via INTRAVENOUS

## 2023-12-09 MED ORDER — PROPOFOL 10 MG/ML IV BOLUS
INTRAVENOUS | Status: AC
  Filled 2023-12-09: qty 20

## 2023-12-09 MED ORDER — MEPIVACAINE HCL (PF) 2 % IJ SOLN
INTRAMUSCULAR | Status: DC | PRN
  Administered 2023-12-09: 20 mL

## 2023-12-09 MED ORDER — OXYCODONE-ACETAMINOPHEN 5-325 MG PO TABS
1.0000 | ORAL_TABLET | Freq: Four times a day (QID) | ORAL | 0 refills | Status: DC | PRN
  Filled 2023-12-09: qty 12, 3d supply, fill #0

## 2023-12-09 MED ORDER — MIDAZOLAM HCL 2 MG/2ML IJ SOLN
INTRAMUSCULAR | Status: AC
  Filled 2023-12-09: qty 2

## 2023-12-09 MED ORDER — CEFAZOLIN SODIUM-DEXTROSE 2-4 GM/100ML-% IV SOLN
INTRAVENOUS | Status: AC
  Filled 2023-12-09: qty 100

## 2023-12-09 MED ORDER — CHLORHEXIDINE GLUCONATE 4 % EX SOLN: 60.0000 mL | Freq: Once | CUTANEOUS | Status: DC

## 2023-12-09 MED ORDER — ORAL CARE MOUTH RINSE: 15.0000 mL | Freq: Once | OROMUCOSAL | Status: AC

## 2023-12-09 MED ORDER — CHLORHEXIDINE GLUCONATE 4 % EX SOLN
60.0000 mL | Freq: Once | CUTANEOUS | Status: DC
Start: 2023-12-10 — End: 2023-12-11

## 2023-12-09 MED ORDER — HEPARIN 6000 UNIT IRRIGATION SOLUTION
Status: AC
Start: 2023-12-09 — End: ?
  Filled 2023-12-09: qty 500

## 2023-12-09 SURGICAL SUPPLY — 31 items
ARMBAND PINK RESTRICT EXTREMIT (MISCELLANEOUS) ×2 IMPLANT
BAG COUNTER SPONGE SURGICOUNT (BAG) ×1 IMPLANT
CANISTER SUCT 3000ML PPV (MISCELLANEOUS) ×1 IMPLANT
CLIP TI MEDIUM 6 (CLIP) ×1 IMPLANT
CLIP TI WIDE RED SMALL 6 (CLIP) ×1 IMPLANT
COVER PROBE W GEL 5X96 (DRAPES) ×1 IMPLANT
DERMABOND ADVANCED .7 DNX12 (GAUZE/BANDAGES/DRESSINGS) ×1 IMPLANT
DERMABOND ADVANCED .7 DNX6 (GAUZE/BANDAGES/DRESSINGS) IMPLANT
ELECT REM PT RETURN 9FT ADLT (ELECTROSURGICAL) ×1 IMPLANT
ELECTRODE REM PT RTRN 9FT ADLT (ELECTROSURGICAL) ×1 IMPLANT
GLOVE SURG SS PI 7.5 STRL IVOR (GLOVE) ×3 IMPLANT
GOWN STRL REUS W/ TWL LRG LVL3 (GOWN DISPOSABLE) ×2 IMPLANT
GOWN STRL REUS W/ TWL XL LVL3 (GOWN DISPOSABLE) ×1 IMPLANT
HEMOSTAT SNOW SURGICEL 2X4 (HEMOSTASIS) IMPLANT
KIT BASIN OR (CUSTOM PROCEDURE TRAY) ×1 IMPLANT
KIT TURNOVER KIT B (KITS) ×1 IMPLANT
LOOP VESSEL MINI RED (MISCELLANEOUS) IMPLANT
NS IRRIG 1000ML POUR BTL (IV SOLUTION) ×1 IMPLANT
PACK CV ACCESS (CUSTOM PROCEDURE TRAY) ×1 IMPLANT
PAD ARMBOARD 7.5X6 YLW CONV (MISCELLANEOUS) ×2 IMPLANT
SLING ARM FOAM STRAP LRG (SOFTGOODS) IMPLANT
SLING ARM FOAM STRAP MED (SOFTGOODS) IMPLANT
SPONGE T-LAP 18X18 ~~LOC~~+RFID (SPONGE) IMPLANT
SURGIFLO W/THROMBIN 8M KIT (HEMOSTASIS) IMPLANT
SUT PROLENE 6 0 BV (SUTURE) IMPLANT
SUT PROLENE 6 0 CC (SUTURE) ×1 IMPLANT
SUT VIC AB 3-0 SH 27X BRD (SUTURE) ×1 IMPLANT
SUT VICRYL 4-0 PS2 18IN ABS (SUTURE) ×1 IMPLANT
TOWEL GREEN STERILE (TOWEL DISPOSABLE) ×1 IMPLANT
UNDERPAD 30X36 HEAVY ABSORB (UNDERPADS AND DIAPERS) ×1 IMPLANT
WATER STERILE IRR 1000ML POUR (IV SOLUTION) ×1 IMPLANT

## 2023-12-09 NOTE — Op Note (Signed)
    Patient name: Joshua Maldonado MRN: [id number] DOB: 09-Jul-1956 Sex: male  12/09/2023 Pre-operative Diagnosis: CKD$ Post-operative diagnosis:  Same Surgeon:  Durene Cal Assistants:  Clinton Gallant, PA Procedure:   Right brachiocephalic fistula Anesthesia:  regional Blood Loss:  minimal Specimens:  none  Findings: 5 mm disease-free artery, 4 mm cephalic vein  Indications: This is a 68 year old gentleman in need of permanent access for dialysis.  He comes in today for fistula creation  Procedure:  The patient was identified in the holding area and taken to [facility] OR ROOM 16  The patient was then placed supine on the table. regional anesthesia was administered.  The patient was prepped and draped in the usual sterile fashion.  A time out was called and antibiotics were administered.  A PA was necessary to expedite the procedure and assist with technical details.  She help with exposure by providing suction and retraction.  She helped with the anastomosis by following the suture.  She help with wound closure.  Ultrasound was used to evaluate the surface veins in the right arm.  He had an excellent cephalic vein.  I made a transverse incision just proximal to the antecubital crease and dissected out the brachial artery which was a disease-free 5 mm artery.  It was encircled with Vesseloops.  Then identified the cephalic vein and fully mobilized it, ligating side branches with silk ties.  The vein was marked for orientation and ligated distally.  It distended nicely with heparin saline.  The brachial artery was then occluded with vascular clamps and a #11 blade was used to make an arteriotomy which was extended longitudinally with Potts scissors.  The vein was cut to the appropriate length and spatulated to fit the size of the arteriotomy.  A running anastomosis was created with 6-0 Prolene.  Prior to completion, the probe flushing maneuvers were performed and the anastomosis was completed.  There was an  excellent thrill within the fistula and a palpable right radial pulse.  The wound was then irrigated.  Hemostasis was achieved.  The incision was closed with a deep layer of 3-0 Vicryl in subcuticular closure with Dermabond.  There were no immediate complications.   Disposition: To PACU stable.   Juleen China, M.D., [facility] [place] Office: [phone] Pager:  [phone]

## 2023-12-09 NOTE — Anesthesia Procedure Notes (Signed)
 Anesthesia Regional Block: Supraclavicular block   Pre-Anesthetic Checklist: , timeout performed,  Correct Patient, Correct Site, Correct Laterality,  Correct Procedure, Correct Position, site marked,  Risks and benefits discussed,  Pre-op evaluation,  At surgeon's request and post-op pain management  Laterality: Right  Prep: Maximum Sterile Barrier Precautions used, chloraprep       Needles:  Injection technique: Single-shot  Needle Type: Echogenic Stimulator Needle     Needle Length: 5cm  Needle Gauge: 21     Additional Needles:   Procedures:,,,, ultrasound used (permanent image in chart),,    Narrative:  Start time: 12/09/2023 7:05 AM End time: 12/09/2023 7:10 AM Injection made incrementally with aspirations every 5 mL. Anesthesiologist: Elmer Picker, MD

## 2023-12-09 NOTE — H&P (Signed)
 Patient name: Joshua Maldonado           MRN: [id number]        DOB: [birth year]          Sex: male     REQUESTING PROVIDER:     Johnn Hai     REASON FOR CONSULT:    Dialysis access   HISTORY OF PRESENT ILLNESS:    Joshua Maldonado is a 68 y.o. male, who is referred for dialysis access.  The patient is right-handed.  He does not have a pacemaker or defibrillator.  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  Cocaine abuse history also could be playing a role.   The patient has a history of cirrhosis, likely related to hepatitis.  He takes a statin for hypercholesterolemia.  He currently works as a [employer] architect.   PAST MEDICAL HISTORY          Past Medical History:  Diagnosis Date   Cirrhosis [facility])      Per patient, diagnosed at [facility].  Reports liver biopsy at [facility].  Suspected to be secondary to hepatitis C.and ETOH.    CKD (chronic kidney disease)     Dilation of biliary tract      Dilated CBD. MRI May 2021 suggesting benign variation.   GERD (gastroesophageal reflux disease)     Gout     Hepatitis C      Per patient, s/p treatment with Harvoni at [facility]; hep C RNA not detected April 2021.   HLD (hyperlipidemia)     Hypertension     LVH (left ventricular hypertrophy)      Moderate, normal EF.   Paroxysmal SVT (supraventricular tachycardia)     Sleep apnea              FAMILY HISTORY         Family History  Problem Relation Age of Onset   Hypertension Father     Colon cancer Neg Hx            SOCIAL HISTORY:    Social History         Socioeconomic History   Marital status: Single      Spouse name: Not on file   Number of children: Not on file   Years of education: Not on file   Highest education level: Not on file  Occupational History   Not on file  Tobacco Use   Smoking status: Every Day      Current packs/day: 0.25      Average packs/day: 0.3 packs/day for 12.0 years (3.0 ttl pk-yrs)      Types:  Cigarettes   Smokeless tobacco: Never  Vaping Use   Vaping status: Never Used  Substance and Sexual Activity   Alcohol use: Yes      Alcohol/week: 5.0 standard drinks of alcohol      Types: 2 Cans of beer, 3 Shots of liquor per week      Comment: couple shots every 3 days.    Drug use: Not Currently      Types: Marijuana, Cocaine      Comment: Last used Marijuana in 2021. Last use cocaine in 2020.    Sexual activity: Yes  Other Topics Concern   Not on file  Social History Narrative   Not on file    Social Determinants of Health    Financial Resource Strain: Not on file  Food Insecurity: Not on file  Transportation Needs: Not on  file  Physical Activity: Not on file  Stress: Not on file  Social Connections: Not on file  Intimate Partner Violence: Not on file      ALLERGIES:      Allergies  No Known Allergies     CURRENT MEDICATIONS:            Current Outpatient Medications  Medication Sig Dispense Refill   allopurinol (ZYLOPRIM) 100 MG tablet Take 100 mg by mouth daily as needed (gout).        amLODipine (NORVASC) 10 MG tablet Take 1 tablet (10 mg total) by mouth daily. 30 tablet 2   atorvastatin (LIPITOR) 10 MG tablet Take 10 mg by mouth daily.       colchicine 0.6 MG tablet TAKE 1 TABLET DAILY. PLEASE ATTEND SCHEDULED APPOINTMENT FOR ADDITIONAL REFILLS. 30 tablet 0   doxycycline (VIBRA-TABS) 100 MG tablet Take 1 tablet (100 mg total) by mouth 2 (two) times daily. 10 tablet 0   DULoxetine (CYMBALTA) 30 MG capsule Take 30 mg by mouth daily. (Patient not taking: Reported on 07/27/2022)       furosemide (LASIX) 20 MG tablet Take 1 tablet (20 mg total) by mouth daily as needed for edema. 90 tablet 3   gabapentin (NEURONTIN) 600 MG tablet Take 1 tablet (600 mg total) by mouth 2 (two) times daily as needed.       hydrALAZINE (APRESOLINE) 10 MG tablet Take 1 tablet (10 mg total) by mouth every 8 (eight) hours. 90 tablet 2   Indomethacin 20 MG CAPS Take 1 capsule by mouth 3  (three) times daily as needed. 21 capsule 0   metoprolol tartrate (LOPRESSOR) 50 MG tablet Take 1 tablet (50 mg total) by mouth 2 (two) times daily. 180 tablet 3   pantoprazole (PROTONIX) 40 MG tablet Take 1 tablet (40 mg total) by mouth 2 (two) times daily before a meal. (Patient not taking: Reported on 07/27/2022) 180 tablet 0   potassium chloride (KLOR-CON) 10 MEQ tablet Take 20 meq daily for 3 days then reduce to 10 meq Daily 90 tablet 3      No current facility-administered medications for this visit.        REVIEW OF SYSTEMS:    [X]  denotes positive finding, [ ]  denotes negative finding Cardiac   Comments:  Chest pain or chest pressure:      Shortness of breath upon exertion:      Short of breath when lying flat:      Irregular heart rhythm:             Vascular      Pain in calf, thigh, or hip brought on by ambulation:      Pain in feet at night that wakes you up from your sleep:       Blood clot in your veins:      Leg swelling:  x           Pulmonary      Oxygen at home:      Productive cough:       Wheezing:              Neurologic      Sudden weakness in arms or legs:       Sudden numbness in arms or legs:       Sudden onset of difficulty speaking or slurred speech:      Temporary loss of vision in one eye:       Problems with dizziness:  Gastrointestinal      Blood in stool:         Vomited blood:              Genitourinary      Burning when urinating:       Blood in urine:             Psychiatric      Major depression:              Hematologic      Bleeding problems:      Problems with blood clotting too easily:             Skin      Rashes or ulcers:             Constitutional      Fever or chills:        PHYSICAL EXAM:    There were no vitals filed for this visit.   GENERAL: The patient is a well-nourished male, in no acute distress. The vital signs are documented above. CARDIAC: There is a regular rate and rhythm.  VASCULAR:  Palpable radial pulse PULMONARY: Nonlabored respiration MUSCULOSKELETAL: There are no major deformities or cyanosis. NEUROLOGIC: No focal weakness or paresthesias are detected. SKIN: There are no ulcers or rashes noted. PSYCHIATRIC: The patient has a normal affect.   STUDIES:    I have reviewed the following:   +-----------------+-------------+----------+---------+  Right Cephalic   Diameter (cm)Depth (cm)Findings   +-----------------+-------------+----------+---------+  Shoulder            0.64                          +-----------------+-------------+----------+---------+  Prox upper arm       0.60                          +-----------------+-------------+----------+---------+  Mid upper arm        0.42                          +-----------------+-------------+----------+---------+  Dist upper arm       0.37                          +-----------------+-------------+----------+---------+  Antecubital fossa    0.63                          +-----------------+-------------+----------+---------+  Prox forearm      0.37 / 0.24           branching  +-----------------+-------------+----------+---------+  Mid forearm          0.23                          +-----------------+-------------+----------+---------+  Dist forearm         0.20                          +-----------------+-------------+----------+---------+  Wrist               0.19                          +-----------------+-------------+----------+---------+   +-----------------+-------------+----------+---------+  Right Basilic    Diameter (cm)Depth (cm)Findings   +-----------------+-------------+----------+---------+  Prox upper arm    0.55 / 0.48           branching  +-----------------+-------------+----------+---------+  Mid upper arm        0.56                          +-----------------+-------------+----------+---------+  Dist upper arm       0.32                           +-----------------+-------------+----------+---------+  Antecubital fossa    0.39                          +-----------------+-------------+----------+---------+  Prox forearm         0.20                          +-----------------+-------------+----------+---------+   +-----------------+-------------+----------+--------+  Left Cephalic    Diameter (cm)Depth (cm)Findings  +-----------------+-------------+----------+--------+  Shoulder            0.42                         +-----------------+-------------+----------+--------+  Prox upper arm       0.43                         +-----------------+-------------+----------+--------+  Mid upper arm        0.25                         +-----------------+-------------+----------+--------+  Dist upper arm       0.31                         +-----------------+-------------+----------+--------+  Antecubital fossa    0.37                         +-----------------+-------------+----------+--------+  Prox forearm         0.32                         +-----------------+-------------+----------+--------+  Mid forearm          0.19                         +-----------------+-------------+----------+--------+  Dist forearm      0.19 / 0.20                     +-----------------+-------------+----------+--------+   +-----------------+-------------+----------+---------+  Left Basilic     Diameter (cm)Depth (cm)Findings   +-----------------+-------------+----------+---------+  Prox upper arm       0.41                          +-----------------+-------------+----------+---------+  Mid upper arm        0.40               branching  +-----------------+-------------+----------+---------+  Dist upper arm    0.32 / 0.36                      +-----------------+-------------+----------+---------+  Antecubital fossa 0.33 / 0.26                       +-----------------+-------------+----------+---------+  Prox forearm         0.15                          +-----------------+-------------+----------+---------+  ASSESSMENT and PLAN    Stage IV renal disease: The patient is right-handed.  I discussed with the patient and his daughter that we would proceed with a right arm fistula.  I discussed the details of the operation as well as the risks and benefits including the need for additional procedures and the risk of steal.  All questions were answered.     Charlena Cross, MD, FACS [facility] [phone] Pager [phone]

## 2023-12-09 NOTE — Discharge Instructions (Signed)

## 2023-12-09 NOTE — Transfer of Care (Signed)
 Immediate Anesthesia Transfer of Care Note  Patient: Joshua Maldonado  Procedure(s) Performed: RIGHT ARM ARTERIOVENOUS (AV) FISTULA CREATION (Right: Arm Upper)  Patient Location: PACU  Anesthesia Type:MAC and Regional  Level of Consciousness: awake, alert , oriented, and patient cooperative  Airway & Oxygen Therapy: Patient Spontanous Breathing and Patient connected to face mask oxygen  Post-op Assessment: Report given to RN, Post -op Vital signs reviewed and stable, Patient moving all extremities, Patient moving all extremities X 4, and Patient able to stick tongue midline  Post vital signs: Reviewed and stable  Last Vitals:  Vitals Value Taken Time  BP 129/82 12/09/23 0900  Temp    Pulse 55 12/09/23 0904  Resp 11 12/09/23 0904  SpO2 98 % 12/09/23 0904  Vitals shown include unfiled device data.  Last Pain:  Vitals:   12/09/23 0636  TempSrc:   PainSc: 0-No pain         Complications: No notable events documented.

## 2023-12-09 NOTE — Anesthesia Postprocedure Evaluation (Signed)
 Anesthesia Post Note  Patient: Joshua Maldonado  Procedure(s) Performed: RIGHT ARM ARTERIOVENOUS (AV) FISTULA CREATION (Right: Arm Upper)     Patient location during evaluation: PACU Anesthesia Type: Regional and MAC Level of consciousness: awake and alert Pain management: pain level controlled Vital Signs Assessment: post-procedure vital signs reviewed and stable Respiratory status: spontaneous breathing, nonlabored ventilation, respiratory function stable and patient connected to nasal cannula oxygen Cardiovascular status: stable and blood pressure returned to baseline Postop Assessment: no apparent nausea or vomiting Anesthetic complications: no  No notable events documented.  Last Vitals:  Vitals:   12/09/23 0900 12/09/23 0915  BP: 129/82 105/88  Pulse: (!) 57 (!) 56  Resp: 13 13  Temp: 36.4 C 36.4 C  SpO2: 98% 94%    Last Pain:  Vitals:   12/09/23 0900  TempSrc:   PainSc: Asleep                 Imer Foxworth L Taiya Nutting

## 2023-12-10 ENCOUNTER — Encounter (HOSPITAL_COMMUNITY): Payer: Self-pay | Admitting: Surgery

## 2023-12-13 DIAGNOSIS — N5201 Erectile dysfunction due to arterial insufficiency: Secondary | ICD-10-CM | POA: Insufficient documentation

## 2023-12-29 ENCOUNTER — Telehealth: Payer: Self-pay

## 2023-12-29 NOTE — Telephone Encounter (Signed)
 Triage/Advice/Appointment: -pt LM stating his arm was swelling -returned call to pt who stated that oh it is ok, it went away and also to check when his f/u appt was.  Pt did not review his discharge summary nor knew where it was for post surgical care.  -pt requested to email the summary to him b/c he did not know how to get into his Mychart.   -emailed d/c summary

## 2024-01-04 ENCOUNTER — Ambulatory Visit (INDEPENDENT_AMBULATORY_CARE_PROVIDER_SITE_OTHER): Admitting: Physician Assistant

## 2024-01-04 VITALS — BP 109/68 | HR 63 | Temp 98.3°F | Resp 18 | Ht 67.0 in | Wt 178.8 lb

## 2024-01-04 DIAGNOSIS — N186 End stage renal disease: Secondary | ICD-10-CM

## 2024-01-04 NOTE — Progress Notes (Signed)
  POST OPERATIVE OFFICE NOTE    CC:  F/u for surgery  HPI:  This is a 68 y.o. male who is s/p right AV fistula creation  on 12/09/23 by Dr. Myra Gianotti.    Pt returns today for follow up.  Pt states he was concerned about the swollen area of the right arm at the inner elbow.  This is wear his incision is.  He states he noticed some redness a week ago.  No drainage, loss of motor or sensation in the right UE.     No Known Allergies  Current Outpatient Medications  Medication Sig Dispense Refill   allopurinol (ZYLOPRIM) 100 MG tablet Take 100 mg by mouth daily as needed (gout).      amLODipine (NORVASC) 10 MG tablet Take 1 tablet (10 mg total) by mouth daily. 30 tablet 2   atorvastatin (LIPITOR) 10 MG tablet Take 10 mg by mouth daily.     colchicine 0.6 MG tablet TAKE 1 TABLET DAILY. PLEASE ATTEND SCHEDULED APPOINTMENT FOR ADDITIONAL REFILLS. 30 tablet 0   DULoxetine (CYMBALTA) 30 MG capsule Take 30 mg by mouth daily.     furosemide (LASIX) 20 MG tablet Take 1 tablet (20 mg total) by mouth daily as needed for edema. 90 tablet 3   gabapentin (NEURONTIN) 600 MG tablet Take 1 tablet (600 mg total) by mouth 2 (two) times daily as needed.     hydrALAZINE (APRESOLINE) 10 MG tablet Take 1 tablet (10 mg total) by mouth every 8 (eight) hours. 90 tablet 2   Indomethacin 20 MG CAPS Take 1 capsule by mouth 3 (three) times daily as needed. 21 capsule 0   metoprolol tartrate (LOPRESSOR) 50 MG tablet Take 1 tablet (50 mg total) by mouth 2 (two) times daily. 180 tablet 3   oxyCODONE-acetaminophen (PERCOCET/ROXICET) 5-325 MG tablet Take 1 tablet by mouth every 6 (six) hours as needed. 12 tablet 0   pantoprazole (PROTONIX) 40 MG tablet Take 1 tablet (40 mg total) by mouth 2 (two) times daily before a meal. 180 tablet 0   potassium chloride (KLOR-CON) 10 MEQ tablet Take 20 meq daily for 3 days then reduce to 10 meq Daily 90 tablet 3   sildenafil (REVATIO) 20 MG tablet Take 1-5 tablets as needed prior to sexual  activity 45 tablet 11   No current facility-administered medications for this visit.     ROS:  See HPI  Physical Exam:    Incision:  healing well without skin compromise, no erythema or edema other than the anastomotic area that is soft and compressible. Extremities:  Palpable thrill and palpable radial pulse     Assessment/Plan:  This is a 68 y.o. male who is s/p:right UE AV BC fistula creation 1 month out from surgery.    No skin compromise well developing with good thrill and no symptoms of steal.  He will keep his f/u in 3 weeks for fistula duplex to check for maturity.  He has a working PD catheter.     Mosetta Pigeon PA-C [facility] [phone]   Clinic MD:  Lenell Antu

## 2024-01-06 ENCOUNTER — Other Ambulatory Visit: Payer: Self-pay

## 2024-01-06 DIAGNOSIS — N186 End stage renal disease: Secondary | ICD-10-CM

## 2024-01-31 ENCOUNTER — Ambulatory Visit: Attending: Surgery | Admitting: Physician Assistant

## 2024-01-31 ENCOUNTER — Ambulatory Visit (HOSPITAL_COMMUNITY)
Admission: RE | Admit: 2024-01-31 | Discharge: 2024-01-31 | Disposition: A | Discharge status: Home / Self Care | Source: Ambulatory Visit | Attending: Surgery | Admitting: Surgery

## 2024-01-31 ENCOUNTER — Encounter: Payer: Self-pay | Admitting: Physician Assistant

## 2024-01-31 VITALS — BP 173/91 | HR 69 | Temp 98.4°F | Ht 67.0 in | Wt 187.8 lb

## 2024-01-31 DIAGNOSIS — N186 End stage renal disease: Secondary | ICD-10-CM | POA: Diagnosis present

## 2024-01-31 NOTE — Progress Notes (Signed)
  POST OPERATIVE OFFICE NOTE    CC:  F/u for surgery  HPI:  This is a 68 y.o. male who is s/p right arm brachiocephalic fistula creation by Dr. Charlotte Cookey on 12/09/2023.  He denies any signs or symptoms of steal syndrome in the right hand.  He believes his incision has healed.  He currently is on peritoneal dialysis.  ESRD is managed by Dr. Carrolyn Clan.  No Known Allergies  Current Outpatient Medications  Medication Sig Dispense Refill   allopurinol (ZYLOPRIM) 100 MG tablet Take 100 mg by mouth daily as needed (gout).      atorvastatin  (LIPITOR) 10 MG tablet Take 10 mg by mouth daily.     colchicine  0.6 MG tablet TAKE 1 TABLET DAILY. PLEASE ATTEND SCHEDULED APPOINTMENT FOR ADDITIONAL REFILLS. 30 tablet 0   DULoxetine (CYMBALTA) 30 MG capsule Take 30 mg by mouth daily.     furosemide  (LASIX ) 20 MG tablet Take 1 tablet (20 mg total) by mouth daily as needed for edema. 90 tablet 3   gabapentin  (NEURONTIN ) 600 MG tablet Take 1 tablet (600 mg total) by mouth 2 (two) times daily as needed.     Indomethacin  20 MG CAPS Take 1 capsule by mouth 3 (three) times daily as needed. 21 capsule 0   metoprolol  tartrate (LOPRESSOR ) 50 MG tablet Take 1 tablet (50 mg total) by mouth 2 (two) times daily. 180 tablet 3   pantoprazole  (PROTONIX ) 40 MG tablet Take 1 tablet (40 mg total) by mouth 2 (two) times daily before a meal. 180 tablet 0   potassium chloride  (KLOR-CON ) 10 MEQ tablet Take 20 meq daily for 3 days then reduce to 10 meq Daily 90 tablet 3   sildenafil  (REVATIO ) 20 MG tablet Take 1-5 tablets as needed prior to sexual activity 45 tablet 11   amLODipine  (NORVASC ) 10 MG tablet Take 1 tablet (10 mg total) by mouth daily. (Patient not taking: Reported on 01/31/2024) 30 tablet 2   hydrALAZINE  (APRESOLINE ) 10 MG tablet Take 1 tablet (10 mg total) by mouth every 8 (eight) hours. (Patient not taking: Reported on 01/31/2024) 90 tablet 2   oxyCODONE -acetaminophen  (PERCOCET/ROXICET) 5-325 MG tablet Take 1 tablet by mouth  every 6 (six) hours as needed. (Patient not taking: Reported on 01/31/2024) 12 tablet 0   No current facility-administered medications for this visit.     ROS:  See HPI  Physical Exam:  Vitals:   01/31/24 1013  BP: (!) 173/91  Pulse: 69  Temp: 98.4 F (36.9 C)  TempSrc: Temporal  SpO2: 97%  Weight: 187 lb 12.8 oz (85.2 kg)  Height: 5\' 7"  (1.702 m)    Incision: Right arm incision healed Extremities: Palpable right radial pulse; palpable thrill throughout the right upper arm Neuro: A&O  Assessment/Plan:  This is a 68 y.o. male who is s/p: Right brachiocephalic fistula creation on 12/09/2023  Patent right arm brachiocephalic fistula without signs or symptoms of steal syndrome in the right hand.  Encouraged the patient to exercise the right hand when at rest.  Okay to begin cannulating fistula on 03/10/2024.  Patient currently has no plans to use fistula for HD.  He is currently on PD and his catheter is working well.  He will follow-up on an as-needed basis.   Cordie Deters, PA-C [facility] [phone]  Clinic MD:  Charlotte Cookey

## 2024-02-08 ENCOUNTER — Encounter (INDEPENDENT_AMBULATORY_CARE_PROVIDER_SITE_OTHER): Payer: Self-pay | Admitting: *Deleted

## 2024-03-20 ENCOUNTER — Ambulatory Visit: Payer: 59 | Admitting: Urology

## 2024-06-02 ENCOUNTER — Encounter: Payer: Self-pay | Admitting: Emergency Medicine

## 2024-06-02 ENCOUNTER — Inpatient Hospital Stay (HOSPITAL_COMMUNITY)
Admission: EM | Admit: 2024-06-02 | Discharge: 2024-06-09 | DRG: 981 | Disposition: A | Discharge status: Home / Self Care | Attending: Student | Admitting: Student

## 2024-06-02 ENCOUNTER — Other Ambulatory Visit: Payer: Self-pay

## 2024-06-02 ENCOUNTER — Ambulatory Visit
Admission: EM | Admit: 2024-06-02 | Discharge: 2024-06-02 | Disposition: A | Discharge status: Home / Self Care | Attending: Family Medicine | Admitting: Family Medicine

## 2024-06-02 DIAGNOSIS — N186 End stage renal disease: Secondary | ICD-10-CM | POA: Diagnosis present

## 2024-06-02 DIAGNOSIS — I120 Hypertensive chronic kidney disease with stage 5 chronic kidney disease or end stage renal disease: Secondary | ICD-10-CM | POA: Diagnosis present

## 2024-06-02 DIAGNOSIS — Z91410 Personal history of adult physical and sexual abuse: Secondary | ICD-10-CM

## 2024-06-02 DIAGNOSIS — T8571XA Infection and inflammatory reaction due to peritoneal dialysis catheter, initial encounter: Principal | ICD-10-CM | POA: Diagnosis present

## 2024-06-02 DIAGNOSIS — M109 Gout, unspecified: Secondary | ICD-10-CM | POA: Diagnosis present

## 2024-06-02 DIAGNOSIS — A4101 Sepsis due to Methicillin susceptible Staphylococcus aureus: Secondary | ICD-10-CM | POA: Diagnosis present

## 2024-06-02 DIAGNOSIS — Y841 Kidney dialysis as the cause of abnormal reaction of the patient, or of later complication, without mention of misadventure at the time of the procedure: Secondary | ICD-10-CM | POA: Diagnosis present

## 2024-06-02 DIAGNOSIS — I10 Essential (primary) hypertension: Secondary | ICD-10-CM | POA: Diagnosis present

## 2024-06-02 DIAGNOSIS — K7460 Unspecified cirrhosis of liver: Secondary | ICD-10-CM | POA: Diagnosis present

## 2024-06-02 DIAGNOSIS — K219 Gastro-esophageal reflux disease without esophagitis: Secondary | ICD-10-CM | POA: Diagnosis present

## 2024-06-02 DIAGNOSIS — G4733 Obstructive sleep apnea (adult) (pediatric): Secondary | ICD-10-CM | POA: Diagnosis present

## 2024-06-02 DIAGNOSIS — D631 Anemia in chronic kidney disease: Secondary | ICD-10-CM | POA: Diagnosis present

## 2024-06-02 DIAGNOSIS — R100 Acute abdomen: Secondary | ICD-10-CM

## 2024-06-02 DIAGNOSIS — R188 Other ascites: Secondary | ICD-10-CM | POA: Diagnosis present

## 2024-06-02 DIAGNOSIS — B1920 Unspecified viral hepatitis C without hepatic coma: Secondary | ICD-10-CM | POA: Diagnosis present

## 2024-06-02 DIAGNOSIS — Z79899 Other long term (current) drug therapy: Secondary | ICD-10-CM

## 2024-06-02 DIAGNOSIS — F17210 Nicotine dependence, cigarettes, uncomplicated: Secondary | ICD-10-CM | POA: Diagnosis present

## 2024-06-02 DIAGNOSIS — N2581 Secondary hyperparathyroidism of renal origin: Secondary | ICD-10-CM | POA: Diagnosis present

## 2024-06-02 DIAGNOSIS — Z8619 Personal history of other infectious and parasitic diseases: Secondary | ICD-10-CM | POA: Diagnosis present

## 2024-06-02 DIAGNOSIS — B9561 Methicillin susceptible Staphylococcus aureus infection as the cause of diseases classified elsewhere: Secondary | ICD-10-CM | POA: Diagnosis present

## 2024-06-02 DIAGNOSIS — K659 Peritonitis, unspecified: Secondary | ICD-10-CM | POA: Diagnosis present

## 2024-06-02 DIAGNOSIS — E785 Hyperlipidemia, unspecified: Secondary | ICD-10-CM | POA: Diagnosis present

## 2024-06-02 DIAGNOSIS — Z8249 Family history of ischemic heart disease and other diseases of the circulatory system: Secondary | ICD-10-CM

## 2024-06-02 DIAGNOSIS — K5900 Constipation, unspecified: Secondary | ICD-10-CM | POA: Diagnosis present

## 2024-06-02 DIAGNOSIS — Z91411 Personal history of adult psychological abuse: Secondary | ICD-10-CM

## 2024-06-02 DIAGNOSIS — Z992 Dependence on renal dialysis: Secondary | ICD-10-CM

## 2024-06-02 LAB — CBC
HCT: 39.5 % (ref 39.0–52.0)
Hemoglobin: 13 g/dL (ref 13.0–17.0)
MCH: 30.4 pg (ref 26.0–34.0)
MCHC: 32.9 g/dL (ref 30.0–36.0)
MCV: 92.3 fL (ref 80.0–100.0)
Platelets: 201 K/uL (ref 150–400)
RBC: 4.28 MIL/uL (ref 4.22–5.81)
RDW: 13.2 % (ref 11.5–15.5)
WBC: 13.2 K/uL — ABNORMAL HIGH (ref 4.0–10.5)
nRBC: 0 % (ref 0.0–0.2)

## 2024-06-02 LAB — URINALYSIS, ROUTINE W REFLEX MICROSCOPIC
Bilirubin Urine: NEGATIVE
Glucose, UA: NEGATIVE mg/dL
Hgb urine dipstick: NEGATIVE
Ketones, ur: NEGATIVE mg/dL
Leukocytes,Ua: NEGATIVE
Nitrite: NEGATIVE
Protein, ur: >=300 mg/dL — AB
Specific Gravity, Urine: 1.013 (ref 1.005–1.030)
pH: 6 (ref 5.0–8.0)

## 2024-06-02 LAB — COMPREHENSIVE METABOLIC PANEL WITH GFR
ALT: 71 U/L — ABNORMAL HIGH (ref 0–44)
AST: 65 U/L — ABNORMAL HIGH (ref 15–41)
Albumin: 3 g/dL — ABNORMAL LOW (ref 3.5–5.0)
Alkaline Phosphatase: 116 U/L (ref 38–126)
Anion gap: 15 (ref 5–15)
BUN: 80 mg/dL — ABNORMAL HIGH (ref 8–23)
CO2: 19 mmol/L — ABNORMAL LOW (ref 22–32)
Calcium: 9.1 mg/dL (ref 8.9–10.3)
Chloride: 102 mmol/L (ref 98–111)
Creatinine, Ser: 9.14 mg/dL — ABNORMAL HIGH (ref 0.61–1.24)
GFR, Estimated: 6 mL/min — ABNORMAL LOW (ref >60)
Glucose, Bld: 98 mg/dL (ref 70–99)
Potassium: 5 mmol/L (ref 3.5–5.1)
Sodium: 136 mmol/L (ref 135–145)
Total Bilirubin: 0.9 mg/dL (ref 0.0–1.2)
Total Protein: 7.9 g/dL (ref 6.5–8.1)

## 2024-06-02 LAB — LIPASE, BLOOD: Lipase: 29 U/L (ref 11–51)

## 2024-06-02 MED ORDER — ONDANSETRON 4 MG PO TBDP
4.0000 mg | ORAL_TABLET | Freq: Once | ORAL | Status: AC | PRN
  Administered 2024-06-02: 4 mg via ORAL
  Filled 2024-06-02: qty 1

## 2024-06-02 NOTE — ED Notes (Signed)
 Patient is being discharged from the Urgent Care and sent to the Emergency Department via private vehicle . Per [facility]  patient is in need of higher level of care due to further evaluation. Patient is aware and verbalizes understanding of plan of care.  Vitals:   06/02/24 1932  BP: (!) 185/79  Pulse: 96  Resp: 20  Temp: 99.3 F (37.4 C)  SpO2: 94%

## 2024-06-02 NOTE — ED Triage Notes (Addendum)
 Patient states that he went out to dinner 6 days ago, felt like he had food poisoning. Things got better than the last 2-3 days, when he goes to drink water or eat anything, it gets stuck mid abdomen causing him to vomit.  History of acid reflux.  Been drinking Pedialyte trying to stay hydrated. Patient does have a port for dialysis that he administers himself which he hopes to do tonight.  Patient noticed a knot/bruise near the port site.

## 2024-06-02 NOTE — ED Triage Notes (Signed)
 Pt has had abdominal pain at area of his peritoneal dialysis port with vomiting and generalized ill feeling x 3 days. Pt last had dialysis on Tuesday night and is due to receive next treatment tonight.

## 2024-06-03 ENCOUNTER — Inpatient Hospital Stay (HOSPITAL_COMMUNITY)

## 2024-06-03 ENCOUNTER — Encounter (HOSPITAL_COMMUNITY): Payer: Self-pay

## 2024-06-03 ENCOUNTER — Emergency Department (HOSPITAL_COMMUNITY)

## 2024-06-03 DIAGNOSIS — K659 Peritonitis, unspecified: Secondary | ICD-10-CM | POA: Diagnosis present

## 2024-06-03 DIAGNOSIS — F17210 Nicotine dependence, cigarettes, uncomplicated: Secondary | ICD-10-CM | POA: Diagnosis present

## 2024-06-03 DIAGNOSIS — Z91411 Personal history of adult psychological abuse: Secondary | ICD-10-CM | POA: Diagnosis not present

## 2024-06-03 DIAGNOSIS — K219 Gastro-esophageal reflux disease without esophagitis: Secondary | ICD-10-CM | POA: Diagnosis present

## 2024-06-03 DIAGNOSIS — Z79899 Other long term (current) drug therapy: Secondary | ICD-10-CM | POA: Diagnosis not present

## 2024-06-03 DIAGNOSIS — Z992 Dependence on renal dialysis: Secondary | ICD-10-CM | POA: Diagnosis not present

## 2024-06-03 DIAGNOSIS — E785 Hyperlipidemia, unspecified: Secondary | ICD-10-CM | POA: Diagnosis present

## 2024-06-03 DIAGNOSIS — R188 Other ascites: Secondary | ICD-10-CM | POA: Diagnosis present

## 2024-06-03 DIAGNOSIS — G4733 Obstructive sleep apnea (adult) (pediatric): Secondary | ICD-10-CM | POA: Diagnosis present

## 2024-06-03 DIAGNOSIS — Y841 Kidney dialysis as the cause of abnormal reaction of the patient, or of later complication, without mention of misadventure at the time of the procedure: Secondary | ICD-10-CM | POA: Diagnosis present

## 2024-06-03 DIAGNOSIS — A4101 Sepsis due to Methicillin susceptible Staphylococcus aureus: Secondary | ICD-10-CM | POA: Diagnosis present

## 2024-06-03 DIAGNOSIS — T8571XA Infection and inflammatory reaction due to peritoneal dialysis catheter, initial encounter: Secondary | ICD-10-CM | POA: Diagnosis present

## 2024-06-03 DIAGNOSIS — Z91410 Personal history of adult physical and sexual abuse: Secondary | ICD-10-CM | POA: Diagnosis not present

## 2024-06-03 DIAGNOSIS — N186 End stage renal disease: Secondary | ICD-10-CM

## 2024-06-03 DIAGNOSIS — M109 Gout, unspecified: Secondary | ICD-10-CM | POA: Diagnosis present

## 2024-06-03 DIAGNOSIS — K7460 Unspecified cirrhosis of liver: Secondary | ICD-10-CM | POA: Diagnosis present

## 2024-06-03 DIAGNOSIS — Z4889 Encounter for other specified surgical aftercare: Secondary | ICD-10-CM | POA: Diagnosis not present

## 2024-06-03 DIAGNOSIS — B1920 Unspecified viral hepatitis C without hepatic coma: Secondary | ICD-10-CM | POA: Diagnosis present

## 2024-06-03 DIAGNOSIS — I120 Hypertensive chronic kidney disease with stage 5 chronic kidney disease or end stage renal disease: Secondary | ICD-10-CM | POA: Diagnosis present

## 2024-06-03 DIAGNOSIS — B9561 Methicillin susceptible Staphylococcus aureus infection as the cause of diseases classified elsewhere: Secondary | ICD-10-CM | POA: Diagnosis present

## 2024-06-03 DIAGNOSIS — N2581 Secondary hyperparathyroidism of renal origin: Secondary | ICD-10-CM | POA: Diagnosis present

## 2024-06-03 DIAGNOSIS — Z8619 Personal history of other infectious and parasitic diseases: Secondary | ICD-10-CM | POA: Diagnosis not present

## 2024-06-03 DIAGNOSIS — D631 Anemia in chronic kidney disease: Secondary | ICD-10-CM | POA: Diagnosis present

## 2024-06-03 DIAGNOSIS — Z8249 Family history of ischemic heart disease and other diseases of the circulatory system: Secondary | ICD-10-CM | POA: Diagnosis not present

## 2024-06-03 DIAGNOSIS — K5900 Constipation, unspecified: Secondary | ICD-10-CM | POA: Diagnosis present

## 2024-06-03 LAB — CBC
HCT: 36.8 % — ABNORMAL LOW (ref 39.0–52.0)
Hemoglobin: 12.1 g/dL — ABNORMAL LOW (ref 13.0–17.0)
MCH: 30.3 pg (ref 26.0–34.0)
MCHC: 32.9 g/dL (ref 30.0–36.0)
MCV: 92.2 fL (ref 80.0–100.0)
Platelets: 204 K/uL (ref 150–400)
RBC: 3.99 MIL/uL — ABNORMAL LOW (ref 4.22–5.81)
RDW: 13.2 % (ref 11.5–15.5)
WBC: 10.5 K/uL (ref 4.0–10.5)
nRBC: 0 % (ref 0.0–0.2)

## 2024-06-03 LAB — BODY FLUID CELL COUNT WITH DIFFERENTIAL
Eos, Fluid: 1 %
Lymphs, Fluid: 3 %
Neutrophil Count, Fluid: 96 % — ABNORMAL HIGH (ref 0–25)
Total Nucleated Cell Count, Fluid: 351 uL (ref 0–1000)

## 2024-06-03 LAB — CREATININE, SERUM
Creatinine, Ser: 8.79 mg/dL — ABNORMAL HIGH (ref 0.61–1.24)
GFR, Estimated: 6 mL/min — ABNORMAL LOW (ref >60)

## 2024-06-03 LAB — HEPATITIS B SURFACE ANTIGEN: Hepatitis B Surface Ag: NONREACTIVE

## 2024-06-03 LAB — HIV ANTIBODY (ROUTINE TESTING W REFLEX): HIV Screen 4th Generation wRfx: NONREACTIVE

## 2024-06-03 MED ORDER — HEPARIN SODIUM (PORCINE) 1000 UNIT/ML DIALYSIS
1000.0000 [IU] | INTRAMUSCULAR | Status: DC | PRN
  Administered 2024-06-03: 1000 [IU]
  Filled 2024-06-03: qty 1

## 2024-06-03 MED ORDER — FOLIC ACID 5 MG/ML IJ SOLN
1.0000 mg | Freq: Every day | INTRAMUSCULAR | Status: DC
  Administered 2024-06-03 – 2024-06-05 (×2): 1 mg via INTRAVENOUS
  Filled 2024-06-03 (×3): qty 0.2

## 2024-06-03 MED ORDER — HEPARIN SODIUM (PORCINE) 1000 UNIT/ML IJ SOLN
Freq: Once | INTRAPERITONEAL | Status: DC
  Filled 2024-06-03: qty 2000

## 2024-06-03 MED ORDER — HEPARIN SODIUM (PORCINE) 5000 UNIT/ML IJ SOLN
5000.0000 [IU] | Freq: Three times a day (TID) | INTRAMUSCULAR | Status: DC
  Administered 2024-06-03 – 2024-06-09 (×15): 5000 [IU] via SUBCUTANEOUS
  Filled 2024-06-03 (×15): qty 1

## 2024-06-03 MED ORDER — ENSURE PLUS HIGH PROTEIN PO LIQD
237.0000 mL | Freq: Two times a day (BID) | ORAL | Status: DC
  Administered 2024-06-03 – 2024-06-09 (×7): 237 mL via ORAL

## 2024-06-03 MED ORDER — DELFLEX-LC/1.5% DEXTROSE 344 MOSM/L IP SOLN: Freq: Once | INTRAPERITONEAL | Status: DC

## 2024-06-03 MED ORDER — SODIUM CHLORIDE 0.9 % IV SOLN
1.0000 g | INTRAVENOUS | Status: DC
  Administered 2024-06-03 – 2024-06-04 (×2): 1 g via INTRAVENOUS
  Filled 2024-06-03 (×3): qty 10
  Filled 2024-06-03: qty 1

## 2024-06-03 MED ORDER — ALTEPLASE 2 MG IJ SOLR: 2.0000 mg | Freq: Once | INTRAMUSCULAR | Status: DC | PRN

## 2024-06-03 MED ORDER — MORPHINE SULFATE (PF) 2 MG/ML IV SOLN
2.0000 mg | INTRAVENOUS | Status: DC | PRN
  Administered 2024-06-03 (×2): 2 mg via INTRAVENOUS
  Filled 2024-06-03 (×2): qty 1

## 2024-06-03 MED ORDER — PENTAFLUOROPROP-TETRAFLUOROETH EX AERO: 1.0000 | INHALATION_SPRAY | CUTANEOUS | Status: DC | PRN

## 2024-06-03 MED ORDER — ONDANSETRON HCL 4 MG/2ML IJ SOLN: 4.0000 mg | Freq: Once | INTRAMUSCULAR | Status: DC

## 2024-06-03 MED ORDER — GENTAMICIN SULFATE 0.1 % EX CREA: 1.0000 | TOPICAL_CREAM | Freq: Every day | CUTANEOUS | Status: DC

## 2024-06-03 MED ORDER — GENTAMICIN SULFATE 0.1 % EX CREA
1.0000 | TOPICAL_CREAM | Freq: Every day | CUTANEOUS | Status: DC
  Administered 2024-06-03: 1 via TOPICAL
  Filled 2024-06-03: qty 15

## 2024-06-03 MED ORDER — LIDOCAINE-PRILOCAINE 2.5-2.5 % EX CREA: 1.0000 | TOPICAL_CREAM | CUTANEOUS | Status: DC | PRN

## 2024-06-03 MED ORDER — GABAPENTIN 100 MG PO CAPS
100.0000 mg | ORAL_CAPSULE | Freq: Two times a day (BID) | ORAL | Status: DC
  Administered 2024-06-03 – 2024-06-09 (×12): 100 mg via ORAL
  Filled 2024-06-03 (×11): qty 1

## 2024-06-03 MED ORDER — PANTOPRAZOLE SODIUM 40 MG IV SOLR
40.0000 mg | Freq: Two times a day (BID) | INTRAVENOUS | Status: DC
  Administered 2024-06-03 – 2024-06-05 (×4): 40 mg via INTRAVENOUS
  Filled 2024-06-03 (×4): qty 10

## 2024-06-03 MED ORDER — HYDROMORPHONE HCL 1 MG/ML IJ SOLN
1.0000 mg | INTRAMUSCULAR | Status: DC | PRN
  Administered 2024-06-03 – 2024-06-08 (×19): 1 mg via INTRAVENOUS
  Filled 2024-06-03 (×19): qty 1

## 2024-06-03 MED ORDER — HEPARIN SODIUM (PORCINE) 1000 UNIT/ML IJ SOLN: 500.0000 [IU] | Freq: Once | INTRAMUSCULAR | Status: DC

## 2024-06-03 MED ORDER — PANTOPRAZOLE SODIUM 40 MG PO TBEC
40.0000 mg | DELAYED_RELEASE_TABLET | Freq: Every day | ORAL | Status: DC
  Administered 2024-06-03: 40 mg via ORAL

## 2024-06-03 MED ORDER — VANCOMYCIN HCL 2000 MG/400ML IV SOLN
2000.0000 mg | Freq: Once | INTRAVENOUS | Status: AC
  Administered 2024-06-03: 2000 mg via INTRAVENOUS
  Filled 2024-06-03: qty 400

## 2024-06-03 MED ORDER — HEPARIN SODIUM (PORCINE) 1000 UNIT/ML DIALYSIS: 1000.0000 [IU] | INTRAMUSCULAR | Status: DC | PRN

## 2024-06-03 MED ORDER — CHLORHEXIDINE GLUCONATE CLOTH 2 % EX PADS
6.0000 | MEDICATED_PAD | Freq: Every day | CUTANEOUS | Status: DC
  Administered 2024-06-04 – 2024-06-09 (×6): 6 via TOPICAL

## 2024-06-03 MED ORDER — THIAMINE HCL 100 MG/ML IJ SOLN
100.0000 mg | Freq: Every day | INTRAMUSCULAR | Status: DC
  Administered 2024-06-03 – 2024-06-05 (×2): 100 mg via INTRAVENOUS
  Filled 2024-06-03: qty 2

## 2024-06-03 MED ORDER — ANTICOAGULANT SODIUM CITRATE 4% (200MG/5ML) IV SOLN: 5.0000 mL | Status: DC | PRN

## 2024-06-03 MED ORDER — HEPARIN SODIUM (PORCINE) 1000 UNIT/ML IJ SOLN
INTRAMUSCULAR | Status: AC
  Filled 2024-06-03: qty 4

## 2024-06-03 MED ORDER — LIDOCAINE HCL (PF) 1 % IJ SOLN: 5.0000 mL | INTRAMUSCULAR | Status: DC | PRN

## 2024-06-03 NOTE — Progress Notes (Signed)
 Spoke with Dr. Lin---pt is draining at this time--also made him aware of this pts RUAF that was placed prior to him needing any kind of dialysis I ordered Hepatitis labs per order----will determine the amount of fluid drained shortly and will notify Dr. Melia

## 2024-06-03 NOTE — ED Notes (Signed)
 Went into pt's room to start IV and administer zofran , but pt declines at this time, also denies any nausea. CT tech showed up and took pt to CT during this time. Dr. Raford notified, states ok to hold off on IV for now.

## 2024-06-03 NOTE — Consult Note (Addendum)
 Reason for Consult: ESRD Referring Physician:  Dr. Bradly Drones  Chief Complaint: Abdominal pain  Assessment/Plan: ESRD on peritoneal dialysis -Will try to infuse 2L this afternoon and see if there is any drain; if not we may need to convert to hemodialysis temporarily -> we had infusing to do 2.5 L with very little return.  Abdomen is fairly tense with possibly umbilical hernia patient becoming more tachycardic. -Low threshold to convert to remittent hemodialysis temporarily; discussed this with the family and they understand and agree -> appreciate CCM agreeing to place a temporary catheter and will initiate dialysis when access is available.  Right upper arm brachiocephalic fistula is not usable at this time. - Also will get vascular on board in case the catheter needs to come out earlier rather than later.  -Monitor Daily I/Os, Daily weight  -Maintain MAP>65 for optimal renal perfusion.  - Avoid nephrotoxic agents such as IV contrast, NSAIDs, and phosphate containing bowel preps (FLEETS)  Peritonitis -we did send for a cell count, Gram stain and culture but only little fluid returned.  We can try again with the next infusion when we test out the peritoneal dialysis catheter -> 351 nucleated cells/mm3 and 96% neutrophils.  Cultures pending. -Patient received cefepime  and vancomycin  this morning HTN - high likely from pain. Anemia - at goal, no ESA indicated OSA Cirrhosis of liver HCV   HPI: Joshua Maldonado is an 68 y.o. male with a history of hepatitis C, hypertension, GERD, hyperlipidemia, paroxysmal SVT, obstructive sleep apnea, end-stage renal disease on peritoneal dialysis followed by Dr. Rachele.  Patient has been on PD for approximately 3 to 4 months and this is his first episode of peritonitis.  Patient has never been on hemodialysis.  Started having some abdominal pain starting on Wednesday but improved for a day before returning with a vengeance.  Patient then had associated  chills, vomiting with food intake and has not really been eating or drinking in the past 5 days prior to admission.  Patient also states that a knot appear in the abdomen which is associated with worsening pain as well as constipation.  His partner who lives with him and tried to give him Pedialyte to try to maintain hydration but eventually he was brought to the ED for evaluation.  He was already feeling poorly and because he was likely confused he forgot to put the Back on the PD catheter on Friday.  Before that the Was applied at all times.  Patient has not noticed any decreased drain but has been more constipated in the past few days.  In the ED his blood pressure was 137/97 with a temperature of 98.9 and saturation 90%, bicarb 19 potassium 5 BUN/creatinine 80/9.14.  Count was 13.2.  CT abdomen was suggestive of peritonitis bowel and mesenteric inflammation as well as fluid and inflammation along the tunnel track.  ROS Pertinent items are noted in HPI.  Chemistry and CBC: Creat  Date/Time Value Ref Range Status  01/31/2020 02:33 PM 1.84 (H) 0.70 - 1.25 mg/dL Final    Comment:    For patients >75 years of age, the reference limit for Creatinine is approximately 13% higher for people identified as African-American. .    Creatinine, Ser  Date/Time Value Ref Range Status  06/03/2024 06:05 AM 8.79 (H) 0.61 - 1.24 mg/dL Final  91/70/7974 90:74 PM 9.14 (H) 0.61 - 1.24 mg/dL Final  96/93/7974 93:68 AM 5.80 (H) 0.61 - 1.24 mg/dL Final  91/77/7977 97:80 PM 2.58 (H) 0.61 -  1.24 mg/dL Final  91/83/7977 98:97 PM 2.69 (H) 0.61 - 1.24 mg/dL Final  92/73/7977 98:64 PM 2.25 (H) 0.61 - 1.24 mg/dL Final  87/95/7978 93:83 AM 1.76 (H) 0.61 - 1.24 mg/dL Final  87/96/7978 95:69 AM 1.70 (H) 0.61 - 1.24 mg/dL Final  87/97/7978 96:49 AM 1.67 (H) 0.61 - 1.24 mg/dL Final  87/98/7978 91:54 AM 1.76 (H) 0.61 - 1.24 mg/dL Final  88/69/7978 94:82 AM 1.82 (H) 0.61 - 1.24 mg/dL Final  88/70/7978 98:54 PM 1.94 (H)  0.61 - 1.24 mg/dL Final  88/95/7978 98:78 PM 1.88 (H) 0.61 - 1.24 mg/dL Final  92/97/7978 88:60 AM 1.76 (H) 0.61 - 1.24 mg/dL Final  94/97/7978 98:48 PM 1.51 (H) 0.61 - 1.24 mg/dL Final  95/75/7978 97:40 PM 1.87 (H) 0.61 - 1.24 mg/dL Final   Recent Labs  Lab 06/02/24 2125 06/03/24 0605  NA 136  --   K 5.0  --   CL 102  --   CO2 19*  --   GLUCOSE 98  --   BUN 80*  --   CREATININE 9.14* 8.79*  CALCIUM  9.1  --    Recent Labs  Lab 06/02/24 2125 06/03/24 0605  WBC 13.2* 10.5  HGB 13.0 12.1*  HCT 39.5 36.8*  MCV 92.3 92.2  PLT 201 204   Liver Function Tests: Recent Labs  Lab 06/02/24 2125  AST 65*  ALT 71*  ALKPHOS 116  BILITOT 0.9  PROT 7.9  ALBUMIN 3.0*   Recent Labs  Lab 06/02/24 2125  LIPASE 29   No results for input(s): AMMONIA in the last 168 hours. Cardiac Enzymes: No results for input(s): CKTOTAL, CKMB, CKMBINDEX, TROPONINI in the last 168 hours. Iron Studies: No results for input(s): IRON, TIBC, TRANSFERRIN, FERRITIN in the last 72 hours. PT/INR: @LABRCNTIP (inr:5)  Xrays/Other Studies: ) Results for orders placed or performed during the hospital encounter of 06/02/24 (from the past 48 hours)  Lipase, blood     Status: None   Collection Time: 06/02/24  9:25 PM  Result Value Ref Range   Lipase 29 11 - 51 U/L    Comment: Performed at [facility], [address], [place], [place] [address]  Comprehensive metabolic panel     Status: Abnormal   Collection Time: 06/02/24  9:25 PM  Result Value Ref Range   Sodium 136 135 - 145 mmol/L   Potassium 5.0 3.5 - 5.1 mmol/L   Chloride 102 98 - 111 mmol/L   CO2 19 (L) 22 - 32 mmol/L   Glucose, Bld 98 70 - 99 mg/dL    Comment: Glucose reference range applies only to samples taken after fasting for at least 8 hours.   BUN 80 (H) 8 - 23 mg/dL   Creatinine, Ser 0.85 (H) 0.61 - 1.24 mg/dL   Calcium  9.1 8.9 - 10.3 mg/dL   Total Protein 7.9 6.5 - 8.1 g/dL   Albumin 3.0 (L) 3.5 - 5.0 g/dL    AST 65 (H) 15 - 41 U/L   ALT 71 (H) 0 - 44 U/L   Alkaline Phosphatase 116 38 - 126 U/L   Total Bilirubin 0.9 0.0 - 1.2 mg/dL   GFR, Estimated 6 (L) >60 mL/min    Comment: (NOTE) Calculated using the CKD-EPI Creatinine Equation (2021)    Anion gap 15 5 - 15    Comment: Performed at [facility], [address], [place], [place] [address]  CBC     Status: Abnormal   Collection Time: 06/02/24  9:25 PM  Result Value Ref Range   WBC 13.2 (H) 4.0 - 10.5 K/uL   RBC 4.28 4.22 - 5.81 MIL/uL   Hemoglobin 13.0 13.0 - 17.0 g/dL   HCT 60.4 60.9 - 47.9 %   MCV 92.3 80.0 - 100.0 fL   MCH 30.4 26.0 - 34.0 pg   MCHC 32.9 30.0 - 36.0 g/dL   RDW 86.7 88.4 - 84.4 %   Platelets 201 150 - 400 K/uL   nRBC 0.0 0.0 - 0.2 %    Comment: Performed at [facility], [address], [place], [place] [address]  Urinalysis, Routine w reflex microscopic -Urine, Clean Catch     Status: Abnormal   Collection Time: 06/02/24 10:22 PM  Result Value Ref Range   Color, Urine YELLOW YELLOW   APPearance CLEAR CLEAR   Specific Gravity, Urine 1.013 1.005 - 1.030   pH 6.0 5.0 - 8.0   Glucose, UA NEGATIVE NEGATIVE mg/dL   Hgb urine dipstick NEGATIVE NEGATIVE   Bilirubin Urine NEGATIVE NEGATIVE   Ketones, ur NEGATIVE NEGATIVE mg/dL   Protein, ur >=699 (A) NEGATIVE mg/dL   Nitrite NEGATIVE NEGATIVE   Leukocytes,Ua NEGATIVE NEGATIVE   RBC / HPF 0-5 0 - 5 RBC/hpf   WBC, UA 6-10 0 - 5 WBC/hpf   Bacteria, UA RARE (A) NONE SEEN   Squamous Epithelial / HPF 0-5 0 - 5 /HPF    Comment: Performed at [facility], [address] [place], [place] [address]  Culture, blood (Routine X 2) w Reflex to ID Panel     Status: None (Preliminary result)   Collection Time: 06/03/24  6:04 AM   Specimen: BLOOD  Result Value Ref Range   Specimen Description BLOOD LEFT ANTECUBITAL    Special Requests      BOTTLES DRAWN AEROBIC AND ANAEROBIC Blood Culture results may not be optimal due to an inadequate volume of blood  received in culture bottles   Culture      NO GROWTH < 12 HOURS Performed at [facility], [address], [place], [place] [address]    Report Status PENDING   Culture, blood (Routine X 2) w Reflex to ID Panel     Status: None (Preliminary result)   Collection Time: 06/03/24  6:04 AM   Specimen: BLOOD LEFT ARM  Result Value Ref Range   Specimen Description BLOOD LEFT ARM    Special Requests      BOTTLES DRAWN AEROBIC AND ANAEROBIC Blood Culture adequate volume   Culture      NO GROWTH < 12 HOURS Performed at [facility], [address], [place], [place] [address]    Report Status PENDING   CBC     Status: Abnormal   Collection Time: 06/03/24  6:05 AM  Result Value Ref Range   WBC 10.5 4.0 - 10.5 K/uL   RBC 3.99 (L) 4.22 - 5.81 MIL/uL   Hemoglobin 12.1 (L) 13.0 - 17.0 g/dL   HCT 63.1 (L) 60.9 - 47.9 %   MCV 92.2 80.0 - 100.0 fL   MCH 30.3 26.0 - 34.0 pg   MCHC 32.9 30.0 - 36.0 g/dL   RDW 86.7 88.4 - 84.4 %   Platelets 204 150 - 400 K/uL   nRBC 0.0 0.0 - 0.2 %    Comment: Performed at [facility], [address] [place], [place] [address]  Creatinine, serum     Status: Abnormal   Collection Time:  06/03/24  6:05 AM  Result Value Ref Range   Creatinine, Ser 8.79 (H) 0.61 - 1.24 mg/dL   GFR, Estimated 6 (L) >60 mL/min    Comment: (NOTE) Calculated using the CKD-EPI Creatinine Equation (2021) Performed at [facility], [address] [place], [place] [address]    CT ABDOMEN PELVIS WO CONTRAST Result Date: 06/03/2024 CLINICAL DATA:  68 year old male with abdominal pain at peritoneal dialysis site. Vomiting and malaise. EXAM: CT ABDOMEN AND PELVIS WITHOUT CONTRAST TECHNIQUE: Multidetector CT imaging of the abdomen and pelvis was performed following the standard protocol without IV contrast. RADIATION DOSE REDUCTION: This exam was performed according to the departmental dose-optimization program which includes automated exposure control, adjustment of  the mA and/or kV according to patient size and/or use of iterative reconstruction technique. COMPARISON:  CT Abdomen and Pelvis 09/02/2020. FINDINGS: Lower chest: Mild but increased cardiomegaly since 2021. No pericardial or pleural effusion. Mild left lung base atelectasis. Hepatobiliary: Chronically nodular and cirrhotic liver. Small volume perihepatic free fluid with mostly simple fluid density. Mild gallbladder wall thickening. Pancreas: Negative noncontrast appearance. Spleen: Negative for splenomegaly. Small volume of perisplenic fluid with some complex fluid density. Adjacent left upper quadrant omental edema and/or inflammation. Adrenals/Urinary Tract: Negative adrenal glands. Negative kidneys. Diminutive ureters. Generalized urinary bladder wall thickening and adjacent edema or inflammation in the lower abdominal mesentery (series 3, image 63). Incidental pelvic phleboliths. Stomach/Bowel: Fluid and inflammatory stranding along the subcutaneous course of the peritoneal dialysis catheter just to the right of the umbilicus series 3, image 49). The catheter is looped in the lower abdomen (series 3, image 59) situated amongst small bowel loops which appear indistinct, abnormally thickened. Small volume of free fluid in the left lower quadrant ranges from load a complex fluid density, and appears mildly spiculated, loculated (series 3, image 61). A small volume of free fluid in both pericolic gutters has more simple fluid density. No bowel transition point. Confluent mesenteric stranding in the left upper quadrant at the greater omentum (series 3, image 17). Nondilated appendix. Nondilated stomach. No pneumoperitoneum identified. Vascular/Lymphatic: Aortoiliac calcified atherosclerosis. Stable aortoiliac tortuosity, nonaneurysmal abdominal aorta. Reproductive: Negative. Other: Trace free fluid in the deep pelvis with mildly complex fluid density. Musculoskeletal: Chronic advanced lumbar spine degeneration and  previous lumbosacral spinal fusion. 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 Severe adjacent segment disease at L1-L2 with pronounced vacuum disc and endplate degeneration. Stable hardware. Chronic proximal left femur intramedullary rod partially visible. No acute osseous abnormality identified. IMPRESSION: 1. Constellation suspicious for Peritonitis in association with peritoneal dialysis catheter, including fluid and inflammation along the subcutaneous catheter course, bowel and mesenteric inflammation about the catheter, small volume of complex fluid in the abdomen and pelvis, and greater omental inflammation in the left upper quadrant, remote from the catheter site. 2. No organized or drainable fluid collection evident on this noncontrast exam. No discrete bowel obstruction. 3. Underlying chronic Cirrhotic liver. Mild cardiomegaly. Aortic Atherosclerosis (ICD10-I70.0). Electronically Signed   By: VEAR Hurst M.D.   On: 06/03/2024 04:09    PMH:   Past Medical History:  Diagnosis Date   Cirrhosis [facility])    Per patient, diagnosed at [facility].  Reports liver biopsy at [facility].  Suspected to be secondary to hepatitis C.and ETOH.    CKD (chronic kidney disease)    Dilation of biliary tract    Dilated CBD. MRI May 2021 suggesting benign variation.   GERD (gastroesophageal reflux disease)    Gout  Hepatitis C    Per patient, s/p treatment with Harvoni at [facility]; hep C RNA not detected April 2021.   HLD (hyperlipidemia)    Hypertension    LVH (left ventricular hypertrophy)    Moderate, normal EF.   Paroxysmal SVT (supraventricular tachycardia) (HCC)    Sleep apnea    uses CPAP occ    PSH:   Past Surgical History:  Procedure Laterality Date   AV FISTULA PLACEMENT Right 12/09/2023   Procedure: RIGHT ARM ARTERIOVENOUS (AV) FISTULA CREATION;  Surgeon: Serene Gaile ORN, MD;  Location: [facility] OR;  Service: Vascular;  Laterality: Right;  MAC, Regional block   BACK  SURGERY     LIVER BIOPSY     Per patient- at [facility]    Allergies: No Known Allergies  Medications:   Prior to Admission medications   Medication Sig Start Date End Date Taking? Authorizing Provider  allopurinol  (ZYLOPRIM ) 100 MG tablet Take 100 mg by mouth daily as needed (gout).  04/05/20  Yes [provider]  colchicine  0.6 MG tablet TAKE 1 TABLET DAILY. PLEASE ATTEND SCHEDULED APPOINTMENT FOR ADDITIONAL REFILLS. 08/26/22  Yes Parthenia Olivia HERO, PA-C  DULoxetine (CYMBALTA) 30 MG capsule Take 30 mg by mouth daily. 07/12/20  Yes [provider]  gabapentin  (NEURONTIN ) 600 MG tablet Take 1 tablet (600 mg total) by mouth 2 (two) times daily as needed. 09/07/20  Yes Johnson, Clanford L, MD  Indomethacin  20 MG CAPS Take 1 capsule by mouth 3 (three) times daily as needed. 11/11/20  Yes Avegno, Komlanvi S, FNP  metoprolol  tartrate (LOPRESSOR ) 50 MG tablet Take 1 tablet (50 mg total) by mouth 2 (two) times daily. 07/27/22  Yes Mallipeddi, Vishnu P, MD  pantoprazole  (PROTONIX ) 40 MG tablet Take 1 tablet (40 mg total) by mouth 2 (two) times daily before a meal. 12/24/21  Yes Rudy, Kristen S, PA-C  sildenafil  (REVATIO ) 20 MG tablet Take 1-5 tablets as needed prior to sexual activity 09/20/23  Yes Nieves Cough, MD  amLODipine  (NORVASC ) 10 MG tablet Take 1 tablet (10 mg total) by mouth daily. Patient not taking: Reported on 01/31/2024 09/07/20   Vicci Afton CROME, MD  atorvastatin  (LIPITOR) 10 MG tablet Take 10 mg by mouth daily.    [provider]  furosemide  (LASIX ) 20 MG tablet Take 1 tablet (20 mg total) by mouth daily as needed for edema. 07/27/22   Mallipeddi, Vishnu P, MD  hydrALAZINE  (APRESOLINE ) 10 MG tablet Take 1 tablet (10 mg total) by mouth every 8 (eight) hours. Patient not taking: Reported on 01/31/2024 09/07/20   Vicci Afton CROME, MD  oxyCODONE -acetaminophen  (PERCOCET/ROXICET) 5-325 MG tablet Take 1 tablet by mouth every 6 (six) hours as needed. Patient  not taking: Reported on 01/31/2024 12/09/23   Gerome Maurilio HERO, PA-C  potassium chloride  (KLOR-CON ) 10 MEQ tablet Take 20 meq daily for 3 days then reduce to 10 meq Daily 04/22/21   Parthenia Olivia HERO, PA-C    Discontinued Meds:  There are no discontinued medications.  Social History:  reports that he has been smoking cigarettes. He has a 3 pack-year smoking history. He has never used smokeless tobacco. He reports that he does not currently use alcohol after a past usage of about 5.0 standard drinks of alcohol per week. He reports that he does not currently use drugs after having used the following drugs: Marijuana and Cocaine.  Family History:   Family History  Problem Relation Age of Onset   Hypertension Father    Colon  cancer Neg Hx     Blood pressure (!) 172/106, pulse 86, temperature 98.9 F (37.2 C), temperature source Oral, resp. rate 18, height 5' 7 (1.702 m), weight 83.9 kg, SpO2 98%. General appearance: alert, cooperative, and mild distress Head: Normocephalic, without obvious abnormality, atraumatic Neck: no adenopathy, no carotid bruit, no JVD, supple, symmetrical, trachea midline, and thyroid not enlarged, symmetric, no tenderness/mass/nodules Back: symmetric, no curvature. ROM normal. No CVA tenderness. Resp: clear to auscultation bilaterally Cardio: regular rate and rhythm GI: tender but no rebound Extremities: extremities normal, atraumatic, no cyanosis or edema Pulses: 2+ and symmetric Access: Catheter on the right abdominal side; right upper arm brachiocephalic fistula very pulsatile but compressible with a weak systolic bruit       Bernal Luhman, LYNWOOD ORN, MD 06/03/2024, 10:32 AM

## 2024-06-03 NOTE — Progress Notes (Signed)
 Received patient in bed to unit.  Alert and oriented.  Informed consent signed and in chart.   TX duration:3 hours  Patient tolerated well.  Transported back to the room  Alert, without acute distress.  Hand-off given to patient's nurse.   Access used: catheter Access issues: none  Total UF removed: none Medication(s) given: none Post HD VS: 136/82 Post HD weight: 82.2 kg   06/03/24 2245  Vitals  Temp 98.6 F (37 C)  Temp Source Oral  BP 136/82  MAP (mmHg) 97  BP Location Left Arm  BP Method Automatic  Patient Position (if appropriate) Lying  Pulse Rate (!) 107  Pulse Rate Source Monitor  ECG Heart Rate (!) 113  Resp 18  Weight 82.2 kg  Type of Weight Post-Dialysis  Oxygen Therapy  O2 Device Nasal Cannula  O2 Flow Rate (L/min) 2 L/min  Patient Activity (if Appropriate) In bed  Pulse Oximetry Type Continuous  During Treatment Monitoring  Blood Flow Rate (mL/min) 0 mL/min  Arterial Pressure (mmHg) -7.27 mmHg  Venous Pressure (mmHg) -2.83 mmHg  TMP (mmHg) -52.93 mmHg  Ultrafiltration Rate (mL/min) 506 mL/min  Dialysate Flow Rate (mL/min) 300 ml/min  Duration of HD Treatment -hour(s) 3 hour(s)  Cumulative Fluid Removed (mL) per Treatment  1000.1  Post Treatment  Dialyzer Clearance Lightly streaked  Hemodialysis Intake (mL) 0 mL  Liters Processed 72  Fluid Removed (mL) 1000 mL  Tolerated HD Treatment Yes  Post-Hemodialysis Comments (S)  Hd tx completed as expected, tolerated well, pt is stable.  AVG/AVF Arterial Site Held (minutes) 0 minutes  AVG/AVF Venous Site Held (minutes) 0 minutes  Note  Patient Observations (S)  py is in bed resting.  Hemodialysis Catheter Right Internal jugular Triple lumen Temporary (Non-Tunneled)  Placement Date/Time: 06/03/24 1644   Time Out: Correct patient;Correct site;Correct procedure  Maximum sterile barrier precautions: Hand hygiene;Sterile gown;Sterile probe cover;Sterile gloves;Large sterile sheet;Mask;Cap  Site Prep:  Chlorhexidine  (pr...  Site Condition No complications  Blue Lumen Status Flushed;Heparin  locked;Dead end cap in place  Red Lumen Status Flushed;Heparin  locked;Dead end cap in place  Purple Lumen Status N/A  Catheter fill solution Heparin  1000 units/ml  Catheter fill volume (Arterial) 1.2 cc  Catheter fill volume (Venous) 1.2  Dressing Type Transparent  Dressing Status Antimicrobial disc/dressing in place  Drainage Description None  Post treatment catheter status Capped and Clamped      [facility]

## 2024-06-03 NOTE — Progress Notes (Signed)
 [facility] PROGRESS NOTE    Progress Note  Joshua Maldonado  FMW:[id number] DOB: 13-Jul-1956 DOA: 06/02/2024 PCP: Branda Hair, NP     Brief Narrative:   Joshua Maldonado is an 68 y.o. male past medical history significant for liver cirrhosis, end-stage renal disease on peritoneal dialysis essential hypertension paroxysmal SVT comes in complaining of diffuse abdominal pain which began 7 days prior to admission, accompanied by weakness and fevers.  In the ED was found afebrile, potassium of 5 white blood cell count of 13, and CT scan of the abdomen and pelvis was suspicious for peritonitis with associated peritoneal dialysis catheter and inflammatory changes along the subcutaneous area    Assessment/Plan:   Sepsis most likely due to peritonitis associated with peritoneal dialysis (HCC) Blood cultures were obtained on 06/03/2024, started empirically on IV vancomycin  and cefepime . Not able to send peritoneal fluid as there were none.  End-stage renal disease on hemodialysis: Nephrology has been consulted.  Hyperlipidemia: Agree with holding statins.  Cirrhosis of the liver: Noted.  History of hepatitis C Noted  DVT prophylaxis: lovenox Family Communication:family Status is: Inpatient Remains inpatient appropriate because: Sepsis    Code Status:     Code Status Orders  (From admission, onward)           Start     Ordered   06/03/24 0540  Full code  Continuous       Question:  By:  Answer:  Consent: discussion documented in EHR   06/03/24 0540           Code Status History     Date Active Date Inactive Code Status Order ID Comments User Context   02/01/2023 0844 02/02/2023 0517 Full Code [id number]  Alona Corners, DO HOV   09/02/2020 2201 09/07/2020 1624 Full Code [id number]  Manfred Driver, DO ED         IV Access:   Peripheral IV   Procedures and diagnostic studies:   CT ABDOMEN PELVIS WO CONTRAST Result Date: 06/03/2024 CLINICAL DATA:   68 year old male with abdominal pain at peritoneal dialysis site. Vomiting and malaise. EXAM: CT ABDOMEN AND PELVIS WITHOUT CONTRAST TECHNIQUE: Multidetector CT imaging of the abdomen and pelvis was performed following the standard protocol without IV contrast. RADIATION DOSE REDUCTION: This exam was performed according to the departmental dose-optimization program which includes automated exposure control, adjustment of the mA and/or kV according to patient size and/or use of iterative reconstruction technique. COMPARISON:  CT Abdomen and Pelvis 09/02/2020. FINDINGS: Lower chest: Mild but increased cardiomegaly since 2021. No pericardial or pleural effusion. Mild left lung base atelectasis. Hepatobiliary: Chronically nodular and cirrhotic liver. Small volume perihepatic free fluid with mostly simple fluid density. Mild gallbladder wall thickening. Pancreas: Negative noncontrast appearance. Spleen: Negative for splenomegaly. Small volume of perisplenic fluid with some complex fluid density. Adjacent left upper quadrant omental edema and/or inflammation. Adrenals/Urinary Tract: Negative adrenal glands. Negative kidneys. Diminutive ureters. Generalized urinary bladder wall thickening and adjacent edema or inflammation in the lower abdominal mesentery (series 3, image 63). Incidental pelvic phleboliths. Stomach/Bowel: Fluid and inflammatory stranding along the subcutaneous course of the peritoneal dialysis catheter just to the right of the umbilicus series 3, image 49). The catheter is looped in the lower abdomen (series 3, image 59) situated amongst small bowel loops which appear indistinct, abnormally thickened. Small volume of free fluid in the left lower quadrant ranges from load a complex fluid density, and appears mildly spiculated, loculated (series 3, image 61). A small volume of  free fluid in both pericolic gutters has more simple fluid density. No bowel transition point. Confluent mesenteric stranding in the  left upper quadrant at the greater omentum (series 3, image 17). Nondilated appendix. Nondilated stomach. No pneumoperitoneum identified. Vascular/Lymphatic: Aortoiliac calcified atherosclerosis. Stable aortoiliac tortuosity, nonaneurysmal abdominal aorta. Reproductive: Negative. Other: Trace free fluid in the deep pelvis with mildly complex fluid density. Musculoskeletal: Chronic advanced lumbar spine degeneration and previous lumbosacral spinal fusion. 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 Severe adjacent segment disease at L1-L2 with pronounced vacuum disc and endplate degeneration. Stable hardware. Chronic proximal left femur intramedullary rod partially visible. No acute osseous abnormality identified. IMPRESSION: 1. Constellation suspicious for Peritonitis in association with peritoneal dialysis catheter, including fluid and inflammation along the subcutaneous catheter course, bowel and mesenteric inflammation about the catheter, small volume of complex fluid in the abdomen and pelvis, and greater omental inflammation in the left upper quadrant, remote from the catheter site. 2. No organized or drainable fluid collection evident on this noncontrast exam. No discrete bowel obstruction. 3. Underlying chronic Cirrhotic liver. Mild cardiomegaly. Aortic Atherosclerosis (ICD10-I70.0). Electronically Signed   By: VEAR Hurst M.D.   On: 06/03/2024 04:09     Medical Consultants:   None.   Subjective:    Joshua Maldonado relates his abdominal pain is better having a lot of hiccups  Objective:    Vitals:   06/03/24 0307 06/03/24 0338 06/03/24 0345 06/03/24 0400  BP:  (!) 138/104 (!) 143/104 (!) 137/97  Pulse:  87 88 90  Resp:  18    Temp:  98.9 F (37.2 C)    TempSrc:  Oral    SpO2:  97% 96% 98%  Weight: 83.9 kg     Height: 5' 7 (1.702 m)      SpO2: 98 %  No intake or output data in the 24 hours ending 06/03/24 0638 Filed Weights   06/03/24 0307  Weight: 83.9 kg     Exam: General exam: In no acute distress. Respiratory system: Good air movement and clear to auscultation. Cardiovascular system: S1 & S2 heard, RRR. No JVD. Gastrointestinal system: Slightly distended diffuse tenderness with some guarding but no rigidity Extremities: No pedal edema. Skin: No rashes, lesions or ulcers Psychiatry: Judgement and insight appear normal. Mood & affect appropriate.    Data Reviewed:    Labs: Basic Metabolic Panel: Recent Labs  Lab 06/02/24 2125  NA 136  K 5.0  CL 102  CO2 19*  GLUCOSE 98  BUN 80*  CREATININE 9.14*  CALCIUM  9.1   GFR Estimated Creatinine Clearance: 8.3 mL/min (A) (by C-G formula based on SCr of 9.14 mg/dL (H)). Liver Function Tests: Recent Labs  Lab 06/02/24 2125  AST 65*  ALT 71*  ALKPHOS 116  BILITOT 0.9  PROT 7.9  ALBUMIN 3.0*   Recent Labs  Lab 06/02/24 2125  LIPASE 29   No results for input(s): AMMONIA in the last 168 hours. Coagulation profile No results for input(s): INR, PROTIME in the last 168 hours. COVID-19 Labs  No results for input(s): DDIMER, FERRITIN, LDH, CRP in the last 72 hours.  Lab Results  Component Value Date   SARSCOV2NAA NEGATIVE 09/02/2020   SARSCOV2NAA NEGATIVE 08/08/2020   SARSCOV2NAA NEGATIVE 04/05/2020    CBC: Recent Labs  Lab 06/02/24 2125 06/03/24 0605  WBC 13.2* 10.5  HGB 13.0 12.1*  HCT 39.5 36.8*  MCV 92.3 92.2  PLT 201 204   Cardiac Enzymes: No results for input(s): CKTOTAL, CKMB, CKMBINDEX,  TROPONINI in the last 168 hours. BNP (last 3 results) No results for input(s): PROBNP in the last 8760 hours. CBG: No results for input(s): GLUCAP in the last 168 hours. D-Dimer: No results for input(s): DDIMER in the last 72 hours. Hgb A1c: No results for input(s): HGBA1C in the last 72 hours. Lipid Profile: No results for input(s): CHOL, HDL, LDLCALC, TRIG, CHOLHDL, LDLDIRECT in the last 72 hours. Thyroid function  studies: No results for input(s): TSH, T4TOTAL, T3FREE, THYROIDAB in the last 72 hours.  Invalid input(s): FREET3 Anemia work up: No results for input(s): VITAMINB12, FOLATE, FERRITIN, TIBC, IRON, RETICCTPCT in the last 72 hours. Sepsis Labs: Recent Labs  Lab 06/02/24 2125 06/03/24 0605  WBC 13.2* 10.5   Microbiology No results found for this or any previous visit (from the past 240 hours).   Medications:    folic acid   1 mg Intravenous Daily   gentamicin  cream  1 Application Topical Daily   heparin   5,000 Units Subcutaneous Q8H   ondansetron  (ZOFRAN ) IV  4 mg Intravenous Once   thiamine  (VITAMIN B1) injection  100 mg Intravenous Daily   Continuous Infusions:  ceFEPime  (MAXIPIME ) IV     dialysis solution 1.5% low-MG/low-CA     vancomycin         LOS: 0 days   Erle Odell Castor  [facility]  06/03/2024, 6:38 AM

## 2024-06-03 NOTE — Progress Notes (Signed)
 Patient heart rate 100 bpm. Pt is having abdomen pain 9/10. Morphine  administered for pain. MD notified. 1 mg dilauded administered per md. Will continue to monitor.

## 2024-06-03 NOTE — Progress Notes (Signed)
 Patient arrived to unit from ED with wife. Pt c/o SOB upon arrival. 2 LPM O2 administered via [employer]. Admissions questions answered. Dual skin assessment complete. Telemonitor box 10 connected. Call bell is within reach of patient. Bed at lowest. Patient resting comfortably.

## 2024-06-03 NOTE — ED Provider Notes (Addendum)
 [place] EMERGENCY DEPARTMENT AT [facility] Provider Note   CSN: [id number] Arrival date & time: 06/02/24  2034     Patient presents with: Abdominal Pain   Joshua Maldonado is a 68 y.o. male.   The history is provided by the patient.  Abdominal Pain  He has history of hypertension, hyperlipidemia, cirrhosis of the liver, end-stage renal disease on peritoneal dialysis and comes in with abdominal pain and vomiting.  He started getting sick about 1 week ago with lower abdominal pain and started vomiting about 3 days ago.  During that time, he has not been able to hold anything down.  He states he has not had a bowel movement or passed any flatus.  He denies fever, chills, sweats.  His peritoneal dialysate has been clear.    Prior to Admission medications   Medication Sig Start Date End Date Taking? Authorizing Provider  allopurinol  (ZYLOPRIM ) 100 MG tablet Take 100 mg by mouth daily as needed (gout).  04/05/20   [provider]  amLODipine  (NORVASC ) 10 MG tablet Take 1 tablet (10 mg total) by mouth daily. Patient not taking: Reported on 01/31/2024 09/07/20   Vicci Afton CROME, MD  atorvastatin  (LIPITOR) 10 MG tablet Take 10 mg by mouth daily.    [provider]  colchicine  0.6 MG tablet TAKE 1 TABLET DAILY. PLEASE ATTEND SCHEDULED APPOINTMENT FOR ADDITIONAL REFILLS. 08/26/22   Parthenia Olivia HERO, PA-C  DULoxetine (CYMBALTA) 30 MG capsule Take 30 mg by mouth daily. 07/12/20   [provider]  furosemide  (LASIX ) 20 MG tablet Take 1 tablet (20 mg total) by mouth daily as needed for edema. 07/27/22   Mallipeddi, Vishnu P, MD  gabapentin  (NEURONTIN ) 600 MG tablet Take 1 tablet (600 mg total) by mouth 2 (two) times daily as needed. 09/07/20   Johnson, Clanford L, MD  hydrALAZINE  (APRESOLINE ) 10 MG tablet Take 1 tablet (10 mg total) by mouth every 8 (eight) hours. Patient not taking: Reported on 01/31/2024 09/07/20   Vicci Afton CROME, MD  Indomethacin  20 MG  CAPS Take 1 capsule by mouth 3 (three) times daily as needed. 11/11/20   Avegno, Komlanvi S, FNP  metoprolol  tartrate (LOPRESSOR ) 50 MG tablet Take 1 tablet (50 mg total) by mouth 2 (two) times daily. 07/27/22   Mallipeddi, Vishnu P, MD  oxyCODONE -acetaminophen  (PERCOCET/ROXICET) 5-325 MG tablet Take 1 tablet by mouth every 6 (six) hours as needed. Patient not taking: Reported on 01/31/2024 12/09/23   Gerome Maurilio HERO, PA-C  pantoprazole  (PROTONIX ) 40 MG tablet Take 1 tablet (40 mg total) by mouth 2 (two) times daily before a meal. 12/24/21   Rudy Josette RAMAN, PA-C  potassium chloride  (KLOR-CON ) 10 MEQ tablet Take 20 meq daily for 3 days then reduce to 10 meq Daily 04/22/21   Parthenia Olivia HERO, PA-C  sildenafil  (REVATIO ) 20 MG tablet Take 1-5 tablets as needed prior to sexual activity 09/20/23   Nieves Cough, MD    Allergies: Patient has no known allergies.    Review of Systems  Gastrointestinal:  Positive for abdominal pain.  All other systems reviewed and are negative.   Updated Vital Signs BP (!) 160/102 (BP Location: Left Arm)   Pulse 88   Temp 98 F (36.7 C)   Resp 20   SpO2 98%   Physical Exam Vitals and nursing note reviewed.   68 year old male, resting comfortably and in no acute distress. Vital signs are significant for elevated blood pressure. Oxygen saturation is 98%, which  is normal. Head is normocephalic and atraumatic. PERRLA, EOMI. Oropharynx is clear. Neck is nontender and supple without adenopathy. Lungs are clear without rales, wheezes, or rhonchi. Chest is nontender. Heart has regular rate and rhythm without murmur. Abdomen is soft, flat, with peritoneal dialysis catheter present.  There is mild tenderness across the lower abdomen but none in the mid or upper abdomen.  There is a mass present to the right of the umbilicus which is tender.  Peristalsis is present. Extremities have no cyanosis or edema.  AV fistula is present in the right upper arm with thrill  present. Skin is warm and dry without rash. Neurologic: Mental status is normal, cranial nerves are intact, moves all extremities equally.  (all labs ordered are listed, but only abnormal results are displayed) Labs Reviewed  COMPREHENSIVE METABOLIC PANEL WITH GFR - Abnormal; Notable for the following components:      Result Value   CO2 19 (*)    BUN 80 (*)    Creatinine, Ser 9.14 (*)    Albumin 3.0 (*)    AST 65 (*)    ALT 71 (*)    GFR, Estimated 6 (*)    All other components within normal limits  CBC - Abnormal; Notable for the following components:   WBC 13.2 (*)    All other components within normal limits  URINALYSIS, ROUTINE W REFLEX MICROSCOPIC - Abnormal; Notable for the following components:   Protein, ur >=300 (*)    Bacteria, UA RARE (*)    All other components within normal limits  LIPASE, BLOOD     Radiology: CT ABDOMEN PELVIS WO CONTRAST Result Date: 06/03/2024 CLINICAL DATA:  68 year old male with abdominal pain at peritoneal dialysis site. Vomiting and malaise. EXAM: CT ABDOMEN AND PELVIS WITHOUT CONTRAST TECHNIQUE: Multidetector CT imaging of the abdomen and pelvis was performed following the standard protocol without IV contrast. RADIATION DOSE REDUCTION: This exam was performed according to the departmental dose-optimization program which includes automated exposure control, adjustment of the mA and/or kV according to patient size and/or use of iterative reconstruction technique. COMPARISON:  CT Abdomen and Pelvis 09/02/2020. FINDINGS: Lower chest: Mild but increased cardiomegaly since 2021. No pericardial or pleural effusion. Mild left lung base atelectasis. Hepatobiliary: Chronically nodular and cirrhotic liver. Small volume perihepatic free fluid with mostly simple fluid density. Mild gallbladder wall thickening. Pancreas: Negative noncontrast appearance. Spleen: Negative for splenomegaly. Small volume of perisplenic fluid with some complex fluid density. Adjacent  left upper quadrant omental edema and/or inflammation. Adrenals/Urinary Tract: Negative adrenal glands. Negative kidneys. Diminutive ureters. Generalized urinary bladder wall thickening and adjacent edema or inflammation in the lower abdominal mesentery (series 3, image 63). Incidental pelvic phleboliths. Stomach/Bowel: Fluid and inflammatory stranding along the subcutaneous course of the peritoneal dialysis catheter just to the right of the umbilicus series 3, image 49). The catheter is looped in the lower abdomen (series 3, image 59) situated amongst small bowel loops which appear indistinct, abnormally thickened. Small volume of free fluid in the left lower quadrant ranges from load a complex fluid density, and appears mildly spiculated, loculated (series 3, image 61). A small volume of free fluid in both pericolic gutters has more simple fluid density. No bowel transition point. Confluent mesenteric stranding in the left upper quadrant at the greater omentum (series 3, image 17). Nondilated appendix. Nondilated stomach. No pneumoperitoneum identified. Vascular/Lymphatic: Aortoiliac calcified atherosclerosis. Stable aortoiliac tortuosity, nonaneurysmal abdominal aorta. Reproductive: Negative. Other: Trace free fluid in the deep pelvis with mildly complex fluid  density. Musculoskeletal: Chronic advanced lumbar spine degeneration and previous lumbosacral spinal fusion. 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 Severe adjacent segment disease at L1-L2 with pronounced vacuum disc and endplate degeneration. Stable hardware. Chronic proximal left femur intramedullary rod partially visible. No acute osseous abnormality identified. IMPRESSION: 1. Constellation suspicious for Peritonitis in association with peritoneal dialysis catheter, including fluid and inflammation along the subcutaneous catheter course, bowel and mesenteric inflammation about the catheter, small volume of complex fluid in the  abdomen and pelvis, and greater omental inflammation in the left upper quadrant, remote from the catheter site. 2. No organized or drainable fluid collection evident on this noncontrast exam. No discrete bowel obstruction. 3. Underlying chronic Cirrhotic liver. Mild cardiomegaly. Aortic Atherosclerosis (ICD10-I70.0). Electronically Signed   By: VEAR Hurst M.D.   On: 06/03/2024 04:09     Procedures   Medications Ordered in the ED  ondansetron  (ZOFRAN -ODT) disintegrating tablet 4 mg (4 mg Oral Given 06/02/24 2124)                                    Medical Decision Making Amount and/or Complexity of Data Reviewed Labs: ordered. Radiology: ordered.  Risk Prescription drug management. Decision regarding hospitalization.   Abdominal pain with vomiting.  This is a presentation with a wide range of treatment options and carries with it a high risk of morbidity and complications.  Differential diagnosis includes, but is not limited to, diverticulitis, bowel obstruction, incarcerated hernia.  With clear dialysate and tenderness rather limited, doubt peritonitis.  I have reviewed his laboratory tests, and my interpretation is mild elevation of transaminases which is new compared with 09/04/2020, renal insufficiency with creatinine significantly higher than on 12/09/2023, mild leukocytosis which is nonspecific, proteinuria consistent with end-stage renal disease.  I have ordered CT of abdomen and pelvis without contrast.  CT scan is actually concerning for peritonitis with fluid and inflammation along the subcutaneous catheter course and bowel and mesenteric inflammation around the catheter and small volume of complex fluid in the abdomen and pelvis.  I have independently viewed the images, and agree with the radiologist's interpretation.  I have discussed case with Dr. Melia, on-call for nephrology, who recommends admitting the patient and infusing dialysate and leaving it in place for 2 hours in order to get  specimen for culture and cell count.  I have discussed case with Dr. Lorren of [facility], who agrees to admit the patient.     Final diagnoses:  Peritonitis associated with peritoneal dialysis, initial encounter [facility])    ED Discharge Orders     None          Raford Lenis, MD 06/03/24 [id number]    Raford Lenis, MD 06/03/24 [phone]

## 2024-06-03 NOTE — Progress Notes (Signed)
 Confirmed via typed note in Epic that this pts new RIJCVC is in the proper location for use for hemodialysis Entry confirmed by Dr. Greig Pique MD via chest xray

## 2024-06-03 NOTE — Progress Notes (Signed)
 Report received from micro lab. Patient has rare gram positive cocci in pairs and clusters. Rare WBC. MD notified.

## 2024-06-03 NOTE — Plan of Care (Signed)

## 2024-06-03 NOTE — Progress Notes (Signed)
 At approx 1420---absolutely no effluent had drained from the 1500cc that had been instilled 1 hour before--pt continues to be uncomfortable---Dr. Melia paged and made aware of this assessment--he is going to talk to the family and get the consent signed as well as make arrangements for a dialysis catheer to be placed for HD----- Pts RUAF is not able to be used per Dr. Melia

## 2024-06-03 NOTE — Progress Notes (Signed)
 Obas RN---dialysis RN---informed me that this pt had no cap on the end of his PD catheter when he went to put fluid in for dwell for cultures per order He soaked the end of the catheter per protocol and set up the dwell I called Dr. Marylynn and made him aware of this---no new orders at this time I instructed Obas RN to complete a safety report on this situation

## 2024-06-03 NOTE — Progress Notes (Signed)
 Pharmacy Antibiotic Note  Joshua Maldonado is a 68 y.o. male admitted on 06/02/2024 with peritonitis concerns.  Pharmacy has been consulted for vancomycin  dosing.  -Cta: suspicious for peritonitis in association with peritoneal dialysis catheter -WBC 13, peritoneal dialysis (CAPD), afebrile -Fluid Cx and blood cultures ordered  Plan: -Cefepime  1g IV every 24 hours -Vancomycin  2g IV x1 -Check level in 3-4 days and re-dose once level below 20 -Follow up signs of clinical improvement, LOT, de-escalation of antibiotics   Height: 5' 7 (170.2 cm) Weight: 83.9 kg (185 lb) IBW/kg (Calculated) : 66.1  Temp (24hrs), Avg:99 F (37.2 C), Min:98 F (36.7 C), Max:99.8 F (37.7 C)  Recent Labs  Lab 06/02/24 2125  WBC 13.2*  CREATININE 9.14*    Estimated Creatinine Clearance: 8.3 mL/min (A) (by C-G formula based on SCr of 9.14 mg/dL (H)).    No Known Allergies  Antimicrobials this admission: Cefepime  8/30 >>  Vancomycin  8/30 >>   Microbiology results: 8/30 BCx:  8/30 peritoneal Fluid Cx:   Thank you for allowing pharmacy to be a part of this patient's care.  Lynwood Poplar, PharmD, BCPS Clinical Pharmacist 06/03/2024 6:04 AM

## 2024-06-03 NOTE — H&P (Signed)
 History and Physical    Patient: Joshua Maldonado FMW:[id number] DOB: 1956/08/22 DOA: 06/02/2024 DOS: the patient was seen and examined on 06/03/2024 PCP: Branda Hair, NP  Patient coming from: Home  Chief Complaint:  Chief Complaint  Patient presents with   Abdominal Pain   HPI: Joshua Maldonado is a 68 y.o. 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 Patient presented to the hospital with complaint of diffuse abdominal pain which began on Friday after dining out.  Reports that he had associated chills and remained mostly bedridden for 2 to 3 days.  By Tuesday of the following week it appeared that his symptoms improved.   However on Wednesday, his condition worsened.  He was unable to tolerate oral intake, persistent not improving abdominal pain with associated subjective fevers had increased confusion were noted by his partner.  She states that she went to store and purchased pedialyte to ensure he remained hydrated.  Given his symptoms were not improving she decided to bring him to the ER for further evaluation.  In the ER, BP is 137/97, heart rate is 90, respiratory rate is 18, temperature is 98.9.  And oxygen saturation is 90%.  Chemistry demonstrated sodium of 136, potassium of 5, chloride 102, bicarb 19, BUN/creatinine 80/9.14 and glucose of 98 patient was noted to have an anion gap of 15.  CBC demonstrated a white blood cell count of 13.2, hemoglobin/hematocrit 13/39.5 and platelet of 201.  Urinalysis was performed which demonstrated greater than 300 protein however it was negative for any bacteria or significant white blood cell count.  CT abdomen was performed which demonstrated constellation suspicious for peritonitis in association with peritoneal dialysis catheter, gluteal fluid and inflammation along the subcutaneous catheter course, bowel and mesenteric inflammation about the  catheter, small volume of complex fluid in the abdomen and pelvis and a greater omental inflammation in the left upper quadrant remote from the catheter site. Review of Systems: As mentioned in the history of present illness. All other systems reviewed and are negative. Past Medical History:  Diagnosis Date   Cirrhosis [facility])    Per patient, diagnosed at [facility].  Reports liver biopsy at [facility].  Suspected to be secondary to hepatitis C.and ETOH.    CKD (chronic kidney disease)    Dilation of biliary tract    Dilated CBD. MRI May 2021 suggesting benign variation.   GERD (gastroesophageal reflux disease)    Gout    Hepatitis C    Per patient, s/p treatment with Harvoni at [facility]; hep C RNA not detected April 2021.   HLD (hyperlipidemia)    Hypertension    LVH (left ventricular hypertrophy)    Moderate, normal EF.   Paroxysmal SVT (supraventricular tachycardia) (HCC)    Sleep apnea    uses CPAP occ   Past Surgical History:  Procedure Laterality Date   AV FISTULA PLACEMENT Right 12/09/2023   Procedure: RIGHT ARM ARTERIOVENOUS (AV) FISTULA CREATION;  Surgeon: Serene Gaile ORN, MD;  Location: [facility] OR;  Service: Vascular;  Laterality: Right;  MAC, Regional block   BACK SURGERY     LIVER BIOPSY     Per patient- at [facility]   Social History:  reports that he has been smoking cigarettes. He has a 3 pack-year smoking history. He has never used smokeless tobacco. He reports that he does not currently use alcohol after a past usage of  about 5.0 standard drinks of alcohol per week. He reports that he does not currently use drugs after having used the following drugs: Marijuana and Cocaine.  No Known Allergies  Family History  Problem Relation Age of Onset   Hypertension Father    Colon cancer Neg Hx     Prior to Admission medications   Medication Sig Start Date End Date Taking? Authorizing Provider  allopurinol  (ZYLOPRIM ) 100 MG tablet Take 100 mg by mouth daily as  needed (gout).  04/05/20  Yes [provider]  colchicine  0.6 MG tablet TAKE 1 TABLET DAILY. PLEASE ATTEND SCHEDULED APPOINTMENT FOR ADDITIONAL REFILLS. 08/26/22  Yes Parthenia Olivia HERO, PA-C  DULoxetine (CYMBALTA) 30 MG capsule Take 30 mg by mouth daily. 07/12/20  Yes [provider]  gabapentin  (NEURONTIN ) 600 MG tablet Take 1 tablet (600 mg total) by mouth 2 (two) times daily as needed. 09/07/20  Yes Johnson, Clanford L, MD  Indomethacin  20 MG CAPS Take 1 capsule by mouth 3 (three) times daily as needed. 11/11/20  Yes Avegno, Komlanvi S, FNP  metoprolol  tartrate (LOPRESSOR ) 50 MG tablet Take 1 tablet (50 mg total) by mouth 2 (two) times daily. 07/27/22  Yes Mallipeddi, Vishnu P, MD  pantoprazole  (PROTONIX ) 40 MG tablet Take 1 tablet (40 mg total) by mouth 2 (two) times daily before a meal. 12/24/21  Yes Rudy, Kristen S, PA-C  sildenafil  (REVATIO ) 20 MG tablet Take 1-5 tablets as needed prior to sexual activity 09/20/23  Yes Nieves Cough, MD  amLODipine  (NORVASC ) 10 MG tablet Take 1 tablet (10 mg total) by mouth daily. Patient not taking: Reported on 01/31/2024 09/07/20   Vicci Afton CROME, MD  atorvastatin  (LIPITOR) 10 MG tablet Take 10 mg by mouth daily.    [provider]  furosemide  (LASIX ) 20 MG tablet Take 1 tablet (20 mg total) by mouth daily as needed for edema. 07/27/22   Mallipeddi, Vishnu P, MD  hydrALAZINE  (APRESOLINE ) 10 MG tablet Take 1 tablet (10 mg total) by mouth every 8 (eight) hours. Patient not taking: Reported on 01/31/2024 09/07/20   Vicci Afton CROME, MD  oxyCODONE -acetaminophen  (PERCOCET/ROXICET) 5-325 MG tablet Take 1 tablet by mouth every 6 (six) hours as needed. Patient not taking: Reported on 01/31/2024 12/09/23   Gerome Maurilio HERO, PA-C  potassium chloride  (KLOR-CON ) 10 MEQ tablet Take 20 meq daily for 3 days then reduce to 10 meq Daily 04/22/21   Parthenia Olivia HERO, [place]    Physical Exam: Vitals:   06/03/24 0307 06/03/24 0338 06/03/24 0345  06/03/24 0400  BP:  (!) 138/104 (!) 143/104 (!) 137/97  Pulse:  87 88 90  Resp:  18    Temp:  98.9 F (37.2 C)    TempSrc:  Oral    SpO2:  97% 96% 98%  Weight: 83.9 kg     Height: 5' 7 (1.702 m)     Physical Exam Cardiovascular:     Rate and Rhythm: Tachycardia present.  Pulmonary:     Effort: Pulmonary effort is normal.     Breath sounds: Rales present.  Abdominal:     General: Bowel sounds are normal. There is distension.     Palpations: Abdomen is soft.     Tenderness: There is abdominal tenderness in the epigastric area and suprapubic area.  Neurological:     Mental Status: He is disoriented.      Data Reviewed:  As seen above  Assessment and Plan: * Peritonitis associated with peritoneal dialysis [facility]) The patient will be  started on IV antibiotics once the peritoneal fluid has been collected in dialysis They are planning to draw the fluid earlier Then he will be started on cefepime  and vancomycin  Blood cultures have been ordered as well for further evaluation   ESRD on dialysis [facility]) Will consult nephrology to see the patient  Hyperlipidemia Will hold off on statin therapy at this time  Cirrhosis of liver without ascites (HCC) Abdomen is soft and non distended  History of hepatitis C stable      Advance Care Planning:   Code Status: Full Code   Consults:  consult nephrology   Family Communication: discussed with significant other at bedside  Severity of Illness: The appropriate patient status for this patient is INPATIENT. Inpatient status is judged to be reasonable and necessary in order to provide the required intensity of service to ensure the patient's safety. The patient's presenting symptoms, physical exam findings, and initial radiographic and laboratory data in the context of their chronic comorbidities is felt to place them at high risk for further clinical deterioration. Furthermore, it is not anticipated that the patient will be medically  stable for discharge from the hospital within 2 midnights of admission.   * I certify that at the point of admission it is my clinical judgment that the patient will require inpatient hospital care spanning beyond 2 midnights from the point of admission due to high intensity of service, high risk for further deterioration and high frequency of surveillance required.*  Author: Shayleigh Bouldin K Braylei Totino, MD 06/03/2024 5:17 AM  For on call review [email or web address].

## 2024-06-03 NOTE — Procedures (Signed)
 Central Venous Catheter Insertion Procedure Note  RAMON ZANDERS  [id number]  [birth year]  Date:06/03/24  Time:4:56 PM   Provider Performing:Tifanie Gardiner JAYSON Sharps   Procedure: Insertion of Non-tunneled Central Venous Catheter(36556)with US  guidance (23062)    Indication(s) Hemodialysis  Consent Risks of the procedure as well as the alternatives and risks of each were explained to the patient and/or caregiver.  Consent for the procedure was obtained and is signed in the bedside chart  Anesthesia Topical only with 1% lidocaine    Timeout Verified patient identification, verified procedure, site/side was marked, verified correct patient position, special equipment/implants available, medications/allergies/relevant history reviewed, required imaging and test results available.  Sterile Technique Maximal sterile technique including full sterile barrier drape, hand hygiene, sterile gown, sterile gloves, mask, hair covering, sterile ultrasound probe cover (if used).  Procedure Description Area of catheter insertion was cleaned with chlorhexidine  and draped in sterile fashion.   With real-time ultrasound guidance a HD catheter was placed into the right internal jugular vein.  Nonpulsatile blood flow and easy flushing noted in all ports.  The catheter was sutured in place and sterile dressing applied.  Complications/Tolerance None; patient tolerated the procedure well. Chest X-ray is ordered to verify placement for internal jugular or subclavian cannulation.  Chest x-ray is not ordered for femoral cannulation.  EBL Minimal  Specimen(s) None

## 2024-06-03 NOTE — Progress Notes (Signed)
 Pt was drained of his dwelling PD solution per order---approx 30cc of serous fluid was drained with blood clots present---catheter capped and secured-- Periteonal samples taken to the lab and processed Dr. Marylynn notified of the condition of the effluent returned as well as to the quantity of 30cc only---no new orders at this time

## 2024-06-03 NOTE — Assessment & Plan Note (Signed)
 Will consult nephrology to see the patient

## 2024-06-03 NOTE — Progress Notes (Signed)
 Attempted to instill 2 liters of 2.5% of fluid to dwell for 1 to 2 hours then to drain---had to fluch approx 30ml of sterile saline into the PD catheter-flushed with slight resistance Was able to instill approx 1500cc of the PD solution --only with applying pressure to the bag--pts baseline of discomfort did not change with this attempt----lines clamped and secured---to notify Dr. Marylynn

## 2024-06-03 NOTE — Plan of Care (Signed)
  Problem: Education: Goal: Knowledge of General Education information will improve Description: Including pain rating scale, medication(s)/side effects and non-pharmacologic comfort measures 06/03/2024 1821 by Casimir Keturah LABOR, RN Outcome: Progressing 06/03/2024 0837 by Casimir Keturah LABOR, RN Outcome: Progressing   Problem: Health Behavior/Discharge Planning: Goal: Ability to manage health-related needs will improve 06/03/2024 1821 by Casimir Keturah LABOR, RN Outcome: Progressing 06/03/2024 0837 by Casimir Keturah LABOR, RN Outcome: Progressing   Problem: Clinical Measurements: Goal: Respiratory complications will improve 06/03/2024 1821 by Casimir Keturah LABOR, RN Outcome: Progressing 06/03/2024 0837 by Casimir Keturah LABOR, RN Outcome: Progressing   Problem: Clinical Measurements: Goal: Cardiovascular complication will be avoided 06/03/2024 1821 by Casimir Keturah LABOR, RN Outcome: Progressing 06/03/2024 0837 by Casimir Keturah LABOR, RN Outcome: Progressing   Problem: Activity: Goal: Risk for activity intolerance will decrease 06/03/2024 1821 by Casimir Keturah LABOR, RN Outcome: Progressing 06/03/2024 0837 by Casimir Keturah LABOR, RN Outcome: Progressing   Problem: Nutrition: Goal: Adequate nutrition will be maintained 06/03/2024 1821 by Casimir Keturah LABOR, RN Outcome: Progressing 06/03/2024 0837 by Casimir Keturah LABOR, RN Outcome: Progressing

## 2024-06-03 NOTE — ED Notes (Signed)
 Peritoneal dialysis nurse setting up for session at bedside
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
## 2024-06-03 NOTE — Assessment & Plan Note (Signed)
 stable

## 2024-06-03 NOTE — Assessment & Plan Note (Signed)
 The patient will be started on IV antibiotics once the peritoneal fluid has been collected in dialysis They are planning to draw the fluid earlier Then he will be started on cefepime  and vancomycin  Blood cultures have been ordered as well for further evaluation

## 2024-06-03 NOTE — ED Provider Notes (Signed)
 [facility]  Note:  This document was prepared using Conservation officer, historic buildings and may include unintentional dictation errors.  MRN: [id number] DOB: 06-02-1956  Subjective:   Joshua Maldonado is a 68 y.o. male presenting for 6-day history of persistent and worsening abdominal pain, inability to eat or drink anything.  Feels that his food gets obstructed and then he vomits.  Patient has ESRD and is on dialysis.  Also has a history of cirrhosis, pancreatitis, GERD, peritonitis associated with peritoneal dialysis.  No current facility-administered medications for this encounter.  Current Outpatient Medications:    atorvastatin  (LIPITOR) 10 MG tablet, Take 10 mg by mouth daily., Disp: , Rfl:    colchicine  0.6 MG tablet, TAKE 1 TABLET DAILY. PLEASE ATTEND SCHEDULED APPOINTMENT FOR ADDITIONAL REFILLS., Disp: 30 tablet, Rfl: 0   DULoxetine (CYMBALTA) 30 MG capsule, Take 30 mg by mouth daily., Disp: , Rfl:    furosemide  (LASIX ) 20 MG tablet, Take 1 tablet (20 mg total) by mouth daily as needed for edema., Disp: 90 tablet, Rfl: 3   gabapentin  (NEURONTIN ) 600 MG tablet, Take 1 tablet (600 mg total) by mouth 2 (two) times daily as needed., Disp: , Rfl:    metoprolol  tartrate (LOPRESSOR ) 50 MG tablet, Take 1 tablet (50 mg total) by mouth 2 (two) times daily., Disp: 180 tablet, Rfl: 3   pantoprazole  (PROTONIX ) 40 MG tablet, Take 1 tablet (40 mg total) by mouth 2 (two) times daily before a meal., Disp: 180 tablet, Rfl: 0   sildenafil  (REVATIO ) 20 MG tablet, Take 1-5 tablets as needed prior to sexual activity, Disp: 45 tablet, Rfl: 11   allopurinol  (ZYLOPRIM ) 100 MG tablet, Take 100 mg by mouth daily as needed (gout). , Disp: , Rfl:    amLODipine  (NORVASC ) 10 MG tablet, Take 1 tablet (10 mg total) by mouth daily. (Patient not taking: Reported on 01/31/2024), Disp: 30 tablet, Rfl: 2   hydrALAZINE  (APRESOLINE ) 10 MG tablet, Take 1 tablet (10 mg total) by mouth every 8 (eight)  hours. (Patient not taking: Reported on 01/31/2024), Disp: 90 tablet, Rfl: 2   Indomethacin  20 MG CAPS, Take 1 capsule by mouth 3 (three) times daily as needed., Disp: 21 capsule, Rfl: 0   oxyCODONE -acetaminophen  (PERCOCET/ROXICET) 5-325 MG tablet, Take 1 tablet by mouth every 6 (six) hours as needed. (Patient not taking: Reported on 01/31/2024), Disp: 12 tablet, Rfl: 0   potassium chloride  (KLOR-CON ) 10 MEQ tablet, Take 20 meq daily for 3 days then reduce to 10 meq Daily, Disp: 90 tablet, Rfl: 3  Facility-Administered Medications Ordered in Other Encounters:    ceFEPIme  (MAXIPIME ) 1 g in sodium chloride  0.9 % 100 mL IVPB, 1 g, Intravenous, Q24H, Stephens, Tiona K, MD, Last Rate: 200 mL/hr at 06/03/24 9178, 1 g at 06/03/24 9178   dialysis solution 1.5% low-MG/low-CA dianeal solution, , Intraperitoneal, Once in dialysis, Melia Lynwood ORN, MD   folic acid  injection 1 mg, 1 mg, Intravenous, Daily, Stephens, Tiona K, MD   gabapentin  (NEURONTIN ) capsule 100 mg, 100 mg, Oral, BID, Feliz Ortiz, Abraham, MD   gentamicin  cream (GARAMYCIN ) 0.1 % 1 Application, 1 Application, Topical, Daily, Melia Lynwood ORN, MD   heparin  injection 5,000 Units, 5,000 Units, Subcutaneous, Q8H, Stephens, Tiona K, MD   morphine  (PF) 2 MG/ML injection 2 mg, 2 mg, Intravenous, Q4H PRN, Stephens, Tiona K, MD, 2 mg at 06/03/24 9374   ondansetron  (ZOFRAN ) injection 4 mg, 4 mg, Intravenous, Once, Raford Lenis, MD   pantoprazole  (PROTONIX ) EC  tablet 40 mg, 40 mg, Oral, Daily, Odell Celinda Balo, MD   thiamine  (VITAMIN B1) injection 100 mg, 100 mg, Intravenous, Daily, Stephens, Tiona K, MD   vancomycin  LUCRECIA) IVPB 2000 mg/400 mL, 2,000 mg, Intravenous, Once, Laron Agent, [facility], Last Rate: 200 mL/hr at 06/03/24 0820, 2,000 mg at 06/03/24 0820   No Known Allergies  Past Medical History:  Diagnosis Date   Cirrhosis [facility])    Per patient, diagnosed at [facility].  Reports liver biopsy at [facility].  Suspected to be secondary to  hepatitis C.and ETOH.    CKD (chronic kidney disease)    Dilation of biliary tract    Dilated CBD. MRI May 2021 suggesting benign variation.   GERD (gastroesophageal reflux disease)    Gout    Hepatitis C    Per patient, s/p treatment with Harvoni at [facility]; hep C RNA not detected April 2021.   HLD (hyperlipidemia)    Hypertension    LVH (left ventricular hypertrophy)    Moderate, normal EF.   Paroxysmal SVT (supraventricular tachycardia) (HCC)    Sleep apnea    uses CPAP occ     Past Surgical History:  Procedure Laterality Date   AV FISTULA PLACEMENT Right 12/09/2023   Procedure: RIGHT ARM ARTERIOVENOUS (AV) FISTULA CREATION;  Surgeon: Serene Gaile ORN, MD;  Location: [facility] OR;  Service: Vascular;  Laterality: Right;  MAC, Regional block   BACK SURGERY     LIVER BIOPSY     Per patient- at [facility]    Family History  Problem Relation Age of Onset   Hypertension Father    Colon cancer Neg Hx     Social History   Tobacco Use   Smoking status: Every Day    Current packs/day: 0.25    Average packs/day: 0.3 packs/day for 12.0 years (3.0 ttl pk-yrs)    Types: Cigarettes   Smokeless tobacco: Never  Vaping Use   Vaping status: Never Used  Substance Use Topics   Alcohol use: Not Currently    Alcohol/week: 5.0 standard drinks of alcohol    Types: 2 Cans of beer, 3 Shots of liquor per week    Comment: couple shots every 3 days.    Drug use: Not Currently    Types: Marijuana, Cocaine    Comment: Last used Marijuana in 2021. Last use cocaine in 2020.     ROS   Objective:   Vitals: BP (!) 185/79 (BP Location: Left Arm)   Pulse 96   Temp 99.3 F (37.4 C) (Oral)   Resp 20   SpO2 94%   Physical Exam Constitutional:      General: He is not in acute distress.    Appearance: Normal appearance. He is well-developed and normal weight. He is not ill-appearing, toxic-appearing or diaphoretic.  HENT:     Head: Normocephalic and atraumatic.     Right Ear: External  ear normal.     Left Ear: External ear normal.     Nose: Nose normal.     Mouth/Throat:     Pharynx: Oropharynx is clear.  Eyes:     General: No scleral icterus.       Right eye: No discharge.        Left eye: No discharge.     Extraocular Movements: Extraocular movements intact.  Cardiovascular:     Rate and Rhythm: Normal rate.  Pulmonary:     Effort: Pulmonary effort is normal.  Abdominal:     General: Bowel sounds are  normal. There is distension.     Palpations: Abdomen is soft. There is no mass.     Tenderness: There is generalized abdominal tenderness. There is guarding. There is no right CVA tenderness, left CVA tenderness or rebound.  Musculoskeletal:     Cervical back: Normal range of motion.  Neurological:     Mental Status: He is alert and oriented to person, place, and time.  Psychiatric:        Mood and Affect: Mood normal.        Behavior: Behavior normal.        Thought Content: Thought content normal.        Judgment: Judgment normal.     Assessment and Plan :   PDMP not reviewed this encounter.  1. Acute abdomen    Patient is in need of a higher level of care than we can provide in the urgent care setting.  This includes rule out and management of an acute abdomen in the setting of cirrhosis, history of peritonitis associated with peritoneal dialysis.  Discussed this with patient and his caregiver who contract for safety and will present to the emergency room now.   Christopher Savannah, PA-C 06/03/24 0830

## 2024-06-03 NOTE — Assessment & Plan Note (Signed)
 Will hold off on statin therapy at this time

## 2024-06-03 NOTE — ED Notes (Addendum)
 Upon greeting patient and completing primary assessment, was notified the patient is going upstairs. [place], [place] transporting. Meds from pharmacy taken in transport for inpatient unit.   Peritoneal dialysis in progress.

## 2024-06-03 NOTE — Assessment & Plan Note (Signed)
Abdomen is soft and nondistended

## 2024-06-04 ENCOUNTER — Inpatient Hospital Stay (HOSPITAL_COMMUNITY): Admitting: Certified Registered Nurse Anesthetist

## 2024-06-04 ENCOUNTER — Other Ambulatory Visit: Payer: Self-pay

## 2024-06-04 ENCOUNTER — Encounter (HOSPITAL_COMMUNITY): Payer: Self-pay

## 2024-06-04 ENCOUNTER — Encounter (HOSPITAL_COMMUNITY)
Admission: EM | Disposition: A | Discharge status: Home / Self Care | Payer: Self-pay | Source: Home / Self Care | Attending: Internal Medicine

## 2024-06-04 DIAGNOSIS — F17210 Nicotine dependence, cigarettes, uncomplicated: Secondary | ICD-10-CM

## 2024-06-04 DIAGNOSIS — Z992 Dependence on renal dialysis: Secondary | ICD-10-CM | POA: Diagnosis not present

## 2024-06-04 DIAGNOSIS — K659 Peritonitis, unspecified: Secondary | ICD-10-CM

## 2024-06-04 DIAGNOSIS — K7460 Unspecified cirrhosis of liver: Secondary | ICD-10-CM | POA: Diagnosis not present

## 2024-06-04 DIAGNOSIS — N186 End stage renal disease: Secondary | ICD-10-CM

## 2024-06-04 DIAGNOSIS — T8571XA Infection and inflammatory reaction due to peritoneal dialysis catheter, initial encounter: Secondary | ICD-10-CM | POA: Diagnosis not present

## 2024-06-04 DIAGNOSIS — I120 Hypertensive chronic kidney disease with stage 5 chronic kidney disease or end stage renal disease: Secondary | ICD-10-CM

## 2024-06-04 HISTORY — PX: CAPD REMOVAL: SHX5234

## 2024-06-04 SURGERY — CONTINUOUS AMBULATORY PERITONEAL DIALYSIS  (CAPD) CATHETER REMOVAL
Anesthesia: General

## 2024-06-04 MED ORDER — DEXAMETHASONE SODIUM PHOSPHATE 10 MG/ML IJ SOLN
INTRAMUSCULAR | Status: AC
  Filled 2024-06-04: qty 1

## 2024-06-04 MED ORDER — LIDOCAINE 2% (20 MG/ML) 5 ML SYRINGE
INTRAMUSCULAR | Status: AC
  Filled 2024-06-04: qty 5

## 2024-06-04 MED ORDER — PHENYLEPHRINE HCL-NACL 20-0.9 MG/250ML-% IV SOLN
INTRAVENOUS | Status: DC | PRN
Start: 2024-06-04 — End: 2024-06-04
  Administered 2024-06-04: 25 ug/min via INTRAVENOUS

## 2024-06-04 MED ORDER — FENTANYL CITRATE (PF) 250 MCG/5ML IJ SOLN
INTRAMUSCULAR | Status: AC
  Filled 2024-06-04: qty 5

## 2024-06-04 MED ORDER — OXYCODONE HCL 5 MG/5ML PO SOLN: 5.0000 mg | Freq: Once | ORAL | Status: DC | PRN

## 2024-06-04 MED ORDER — PHENYLEPHRINE 80 MCG/ML (10ML) SYRINGE FOR IV PUSH (FOR BLOOD PRESSURE SUPPORT)
PREFILLED_SYRINGE | INTRAVENOUS | Status: DC | PRN
  Administered 2024-06-04: 40 ug via INTRAVENOUS
  Administered 2024-06-04: 160 ug via INTRAVENOUS
  Administered 2024-06-04: 120 ug via INTRAVENOUS
  Administered 2024-06-04 (×2): 80 ug via INTRAVENOUS

## 2024-06-04 MED ORDER — FENTANYL CITRATE (PF) 100 MCG/2ML IJ SOLN: 25.0000 ug | INTRAMUSCULAR | Status: DC | PRN

## 2024-06-04 MED ORDER — ORAL CARE MOUTH RINSE: 15.0000 mL | Freq: Once | OROMUCOSAL | Status: AC

## 2024-06-04 MED ORDER — LIDOCAINE HCL (CARDIAC) PF 100 MG/5ML IV SOSY
PREFILLED_SYRINGE | INTRAVENOUS | Status: DC | PRN
  Administered 2024-06-04: 80 mg via INTRAVENOUS

## 2024-06-04 MED ORDER — PROPOFOL 10 MG/ML IV BOLUS
INTRAVENOUS | Status: AC
  Filled 2024-06-04: qty 20

## 2024-06-04 MED ORDER — MIDAZOLAM HCL 2 MG/2ML IJ SOLN
INTRAMUSCULAR | Status: DC | PRN
  Administered 2024-06-04: 1 mg via INTRAVENOUS

## 2024-06-04 MED ORDER — CHLORHEXIDINE GLUCONATE 0.12 % MT SOLN
OROMUCOSAL | Status: AC
  Filled 2024-06-04: qty 15

## 2024-06-04 MED ORDER — SODIUM CHLORIDE 0.9% FLUSH: 10.0000 mL | INTRAVENOUS | Status: DC | PRN

## 2024-06-04 MED ORDER — PHENYLEPHRINE 80 MCG/ML (10ML) SYRINGE FOR IV PUSH (FOR BLOOD PRESSURE SUPPORT)
PREFILLED_SYRINGE | INTRAVENOUS | Status: AC
  Filled 2024-06-04: qty 10

## 2024-06-04 MED ORDER — ROCURONIUM BROMIDE 10 MG/ML (PF) SYRINGE
PREFILLED_SYRINGE | INTRAVENOUS | Status: AC
  Filled 2024-06-04: qty 10

## 2024-06-04 MED ORDER — ROCURONIUM BROMIDE 10 MG/ML (PF) SYRINGE
PREFILLED_SYRINGE | INTRAVENOUS | Status: DC | PRN
  Administered 2024-06-04: 50 mg via INTRAVENOUS

## 2024-06-04 MED ORDER — 0.9 % SODIUM CHLORIDE (POUR BTL) OPTIME
TOPICAL | Status: DC | PRN
  Administered 2024-06-04: 1000 mL

## 2024-06-04 MED ORDER — ONDANSETRON HCL 4 MG/2ML IJ SOLN
INTRAMUSCULAR | Status: DC | PRN
  Administered 2024-06-04: 4 mg via INTRAVENOUS

## 2024-06-04 MED ORDER — DROPERIDOL 2.5 MG/ML IJ SOLN: 0.6250 mg | Freq: Once | INTRAMUSCULAR | Status: DC | PRN

## 2024-06-04 MED ORDER — PHENYLEPHRINE HCL (PRESSORS) 10 MG/ML IV SOLN
INTRAVENOUS | Status: AC
  Filled 2024-06-04: qty 1

## 2024-06-04 MED ORDER — VANCOMYCIN HCL IN DEXTROSE 1-5 GM/200ML-% IV SOLN: 1000.0000 mg | Freq: Once | INTRAVENOUS | Status: DC

## 2024-06-04 MED ORDER — CHLORHEXIDINE GLUCONATE 0.12 % MT SOLN
15.0000 mL | Freq: Once | OROMUCOSAL | Status: AC
  Administered 2024-06-04: 15 mL via OROMUCOSAL

## 2024-06-04 MED ORDER — MIDAZOLAM HCL 2 MG/2ML IJ SOLN
INTRAMUSCULAR | Status: AC
  Filled 2024-06-04: qty 2

## 2024-06-04 MED ORDER — SUGAMMADEX SODIUM 200 MG/2ML IV SOLN
INTRAVENOUS | Status: DC | PRN
  Administered 2024-06-04: 200 mg via INTRAVENOUS

## 2024-06-04 MED ORDER — ONDANSETRON HCL 4 MG/2ML IJ SOLN
INTRAMUSCULAR | Status: AC
Start: 2024-06-04 — End: 2024-06-04
  Filled 2024-06-04: qty 2

## 2024-06-04 MED ORDER — PROPOFOL 10 MG/ML IV BOLUS
INTRAVENOUS | Status: DC | PRN
  Administered 2024-06-04: 150 mg via INTRAVENOUS

## 2024-06-04 MED ORDER — DEXAMETHASONE SODIUM PHOSPHATE 10 MG/ML IJ SOLN
INTRAMUSCULAR | Status: DC | PRN
  Administered 2024-06-04: 5 mg via INTRAVENOUS

## 2024-06-04 MED ORDER — FENTANYL CITRATE (PF) 250 MCG/5ML IJ SOLN
INTRAMUSCULAR | Status: DC | PRN
  Administered 2024-06-04: 50 ug via INTRAVENOUS

## 2024-06-04 MED ORDER — SODIUM CHLORIDE 0.9 % IV SOLN: INTRAVENOUS | Status: DC

## 2024-06-04 MED ORDER — OXYCODONE HCL 5 MG PO TABS: 5.0000 mg | ORAL_TABLET | Freq: Once | ORAL | Status: DC | PRN

## 2024-06-04 SURGICAL SUPPLY — 23 items
BENZOIN TINCTURE PRP APPL 2/3 (GAUZE/BANDAGES/DRESSINGS) ×1 IMPLANT
BLADE CLIPPER SURG (BLADE) IMPLANT
CHLORAPREP W/TINT 26 (MISCELLANEOUS) ×1 IMPLANT
COVER SURGICAL LIGHT HANDLE (MISCELLANEOUS) ×1 IMPLANT
DRAPE LAPAROSCOPIC ABDOMINAL (DRAPES) IMPLANT
DRSG IV TEGADERM 3.5X4.5 STRL (GAUZE/BANDAGES/DRESSINGS) ×1 IMPLANT
ELECTRODE REM PT RTRN 9FT ADLT (ELECTROSURGICAL) ×1 IMPLANT
GAUZE SPONGE 4X4 12PLY STRL (GAUZE/BANDAGES/DRESSINGS) IMPLANT
GLOVE BIO SURGEON STRL SZ8 (GLOVE) ×1 IMPLANT
GLOVE BIOGEL M STRL SZ7.5 (GLOVE) ×1 IMPLANT
GOWN STRL REUS W/ TWL XL LVL3 (GOWN DISPOSABLE) ×1 IMPLANT
KIT BASIN OR (CUSTOM PROCEDURE TRAY) ×1 IMPLANT
KIT TURNOVER KIT B (KITS) ×1 IMPLANT
NS IRRIG 1000ML POUR BTL (IV SOLUTION) ×1 IMPLANT
PACK GENERAL/GYN (CUSTOM PROCEDURE TRAY) ×1 IMPLANT
PAD ARMBOARD POSITIONER FOAM (MISCELLANEOUS) ×2 IMPLANT
SPONGE GAUZE 2X2 8PLY STRL LF (GAUZE/BANDAGES/DRESSINGS) ×1 IMPLANT
STRIP CLOSURE SKIN 1/2X4 (GAUZE/BANDAGES/DRESSINGS) ×2 IMPLANT
SUT MNCRL AB 4-0 PS2 18 (SUTURE) ×2 IMPLANT
SUT VIC AB 3-0 SH 27X BRD (SUTURE) ×1 IMPLANT
TOWEL GREEN STERILE (TOWEL DISPOSABLE) ×1 IMPLANT
TUBE CONNECTING 12X1/4 (SUCTIONS) ×1 IMPLANT
WATER STERILE IRR 1000ML POUR (IV SOLUTION) ×1 IMPLANT

## 2024-06-04 NOTE — Progress Notes (Signed)
 [facility] PROGRESS NOTE    Progress Note  Joshua Maldonado  FMW:[id number] DOB: 12/20/1955 DOA: 06/02/2024 PCP: Branda Hair, NP     Brief Narrative:   Joshua Maldonado is an 68 y.o. male past medical history significant for liver cirrhosis, end-stage renal disease on peritoneal dialysis essential hypertension paroxysmal SVT comes in complaining of diffuse abdominal pain which began 7 days prior to admission, accompanied by weakness and fevers.  In the ED was found afebrile, potassium of 5 white blood cell count of 13, and CT scan of the abdomen and pelvis was suspicious for peritonitis with associated peritoneal dialysis catheter and inflammatory changes along the subcutaneous area  Assessment/Plan:   Sepsis most likely due to peritonitis associated with peritoneal dialysis (HCC) Blood cultures were obtained on 06/03/2024, started empirically on IV vancomycin  and cefepime . Peritoneal fluid Gram stain showed gram-positive cocci in clusters Vascular surgery was consulted for PD catheter removal on 06/04/2024.  End-stage renal disease on hemodialysis: Nephrology has been consulted, transition to hemodialysis. Status post HD on 06/03/2024.  Hyperlipidemia: Agree with holding statins.  Cirrhosis of the liver: Noted.  History of hepatitis C Noted  DVT prophylaxis: lovenox Family Communication:family Status is: Inpatient Remains inpatient appropriate because: Sepsis    Code Status:     Code Status Orders  (From admission, onward)           Start     Ordered   06/03/24 0540  Full code  Continuous       Question:  By:  Answer:  Consent: discussion documented in EHR   06/03/24 0540           Code Status History     Date Active Date Inactive Code Status Order ID Comments User Context   02/01/2023 0844 02/02/2023 0517 Full Code [id number]  Alona Corners, DO HOV   09/02/2020 2201 09/07/2020 1624 Full Code [id number]  Joshua Driver, DO ED         IV  Access:   Peripheral IV   Procedures and diagnostic studies:   DG Chest Port 1 View Result Date: 06/03/2024 CLINICAL DATA:  Central line placement EXAM: PORTABLE CHEST 1 VIEW COMPARISON:  Chest x-ray 02/04/2020 FINDINGS: Right-sided central venous catheter is new with distal tip projecting over the distal SVC. The cardiomediastinal silhouette is stable and within normal limits. Lung volumes are low likely accentuating central pulmonary vascularity. There some patchy retrocardiac opacities, unchanged. Questionable new nodular density in the right lung apex measuring 6 mm. There is no pleural effusion or pneumothorax. No acute fractures are seen. IMPRESSION: 1. Right-sided central venous catheter with distal tip projecting over the distal SVC. No pneumothorax. 2. Questionable new nodular density in the right lung apex measuring 6 mm. Recommend further evaluation with chest CT. Electronically Signed   By: Greig Pique M.D.   On: 06/03/2024 17:39   CT ABDOMEN PELVIS WO CONTRAST Result Date: 06/03/2024 CLINICAL DATA:  68 year old male with abdominal pain at peritoneal dialysis site. Vomiting and malaise. EXAM: CT ABDOMEN AND PELVIS WITHOUT CONTRAST TECHNIQUE: Multidetector CT imaging of the abdomen and pelvis was performed following the standard protocol without IV contrast. RADIATION DOSE REDUCTION: This exam was performed according to the departmental dose-optimization program which includes automated exposure control, adjustment of the mA and/or kV according to patient size and/or use of iterative reconstruction technique. COMPARISON:  CT Abdomen and Pelvis 09/02/2020. FINDINGS: Lower chest: Mild but increased cardiomegaly since 2021. No pericardial or pleural effusion. Mild left lung base atelectasis. Hepatobiliary:  Chronically nodular and cirrhotic liver. Small volume perihepatic free fluid with mostly simple fluid density. Mild gallbladder wall thickening. Pancreas: Negative noncontrast appearance.  Spleen: Negative for splenomegaly. Small volume of perisplenic fluid with some complex fluid density. Adjacent left upper quadrant omental edema and/or inflammation. Adrenals/Urinary Tract: Negative adrenal glands. Negative kidneys. Diminutive ureters. Generalized urinary bladder wall thickening and adjacent edema or inflammation in the lower abdominal mesentery (series 3, image 63). Incidental pelvic phleboliths. Stomach/Bowel: Fluid and inflammatory stranding along the subcutaneous course of the peritoneal dialysis catheter just to the right of the umbilicus series 3, image 49). The catheter is looped in the lower abdomen (series 3, image 59) situated amongst small bowel loops which appear indistinct, abnormally thickened. Small volume of free fluid in the left lower quadrant ranges from load a complex fluid density, and appears mildly spiculated, loculated (series 3, image 61). A small volume of free fluid in both pericolic gutters has more simple fluid density. No bowel transition point. Confluent mesenteric stranding in the left upper quadrant at the greater omentum (series 3, image 17). Nondilated appendix. Nondilated stomach. No pneumoperitoneum identified. Vascular/Lymphatic: Aortoiliac calcified atherosclerosis. Stable aortoiliac tortuosity, nonaneurysmal abdominal aorta. Reproductive: Negative. Other: Trace free fluid in the deep pelvis with mildly complex fluid density. Musculoskeletal: Chronic advanced lumbar spine degeneration and previous lumbosacral spinal fusion. 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 Severe adjacent segment disease at L1-L2 with pronounced vacuum disc and endplate degeneration. Stable hardware. Chronic proximal left femur intramedullary rod partially visible. No acute osseous abnormality identified. IMPRESSION: 1. Constellation suspicious for Peritonitis in association with peritoneal dialysis catheter, including fluid and inflammation along the  subcutaneous catheter course, bowel and mesenteric inflammation about the catheter, small volume of complex fluid in the abdomen and pelvis, and greater omental inflammation in the left upper quadrant, remote from the catheter site. 2. No organized or drainable fluid collection evident on this noncontrast exam. No discrete bowel obstruction. 3. Underlying chronic Cirrhotic liver. Mild cardiomegaly. Aortic Atherosclerosis (ICD10-I70.0). Electronically Signed   By: VEAR Hurst M.D.   On: 06/03/2024 04:09     Medical Consultants:   None.   Subjective:    STEPAN VERRETTE he Is improved no complaints.  Objective:    Vitals:   06/03/24 2245 06/03/24 2317 06/04/24 0418 06/04/24 0700  BP: 136/82 (!) 120/104 (!) 115/104 (!) 123/98  Pulse: (!) 107 (!) 115 95 93  Resp: 18 20 20 20   Temp: 98.6 F (37 C) 98.1 F (36.7 C) 97.6 F (36.4 C) 97.8 F (36.6 C)  TempSrc: Oral Oral Oral Oral  SpO2:  100% 99% 95%  Weight: 82.2 kg     Height:       SpO2: 95 % O2 Flow Rate (L/min): 2 L/min   Intake/Output Summary (Last 24 hours) at 06/04/2024 0748 Last data filed at 06/03/2024 2245 Gross per 24 hour  Intake 100 ml  Output 1000 ml  Net -900 ml   Filed Weights   06/03/24 0307 06/03/24 1918 06/03/24 2245  Weight: 83.9 kg 83.2 kg 82.2 kg    Exam: General exam: In no acute distress. Respiratory system: Good air movement and clear to auscultation. Cardiovascular system: S1 & S2 heard, RRR. No JVD. Gastrointestinal system: Abdomen is nondistended, soft and mildly tender PD catheter is out Extremities: No pedal edema. Skin: No rashes, lesions or ulcers Psychiatry: Judgement and insight appear normal. Mood & affect appropriate.  Data Reviewed:    Labs: Basic Metabolic Panel: Recent Labs  Lab  06/02/24 2125 06/03/24 0605  NA 136  --   K 5.0  --   CL 102  --   CO2 19*  --   GLUCOSE 98  --   BUN 80*  --   CREATININE 9.14* 8.79*  CALCIUM  9.1  --    GFR Estimated Creatinine Clearance:  8.6 mL/min (A) (by C-G formula based on SCr of 8.79 mg/dL (H)). Liver Function Tests: Recent Labs  Lab 06/02/24 2125  AST 65*  ALT 71*  ALKPHOS 116  BILITOT 0.9  PROT 7.9  ALBUMIN 3.0*   Recent Labs  Lab 06/02/24 2125  LIPASE 29   No results for input(s): AMMONIA in the last 168 hours. Coagulation profile No results for input(s): INR, PROTIME in the last 168 hours. COVID-19 Labs  No results for input(s): DDIMER, FERRITIN, LDH, CRP in the last 72 hours.  Lab Results  Component Value Date   SARSCOV2NAA NEGATIVE 09/02/2020   SARSCOV2NAA NEGATIVE 08/08/2020   SARSCOV2NAA NEGATIVE 04/05/2020    CBC: Recent Labs  Lab 06/02/24 2125 06/03/24 0605  WBC 13.2* 10.5  HGB 13.0 12.1*  HCT 39.5 36.8*  MCV 92.3 92.2  PLT 201 204   Cardiac Enzymes: No results for input(s): CKTOTAL, CKMB, CKMBINDEX, TROPONINI in the last 168 hours. BNP (last 3 results) No results for input(s): PROBNP in the last 8760 hours. CBG: No results for input(s): GLUCAP in the last 168 hours. D-Dimer: No results for input(s): DDIMER in the last 72 hours. Hgb A1c: No results for input(s): HGBA1C in the last 72 hours. Lipid Profile: No results for input(s): CHOL, HDL, LDLCALC, TRIG, CHOLHDL, LDLDIRECT in the last 72 hours. Thyroid function studies: No results for input(s): TSH, T4TOTAL, T3FREE, THYROIDAB in the last 72 hours.  Invalid input(s): FREET3 Anemia work up: No results for input(s): VITAMINB12, FOLATE, FERRITIN, TIBC, IRON, RETICCTPCT in the last 72 hours. Sepsis Labs: Recent Labs  Lab 06/02/24 2125 06/03/24 0605  WBC 13.2* 10.5   Microbiology Recent Results (from the past 240 hours)  Body fluid culture w Gram Stain     Status: None (Preliminary result)   Collection Time: 06/03/24  4:43 AM   Specimen: Peritoneal Dialysate; Body Fluid  Result Value Ref Range Status   Specimen Description PERITONEAL DIALYSATE  Final    Special Requests Normal  Final   Gram Stain   Final    RARE WBC PRESENT, PREDOMINANTLY PMN RARE GRAM POSITIVE COCCI IN CLUSTERS IN PAIRS CRITICAL RESULT CALLED TO, READ BACK BY AND VERIFIED WITH: RN KETURAH MARINA [phone] AT 1438 BY EC Performed at [facility], [address] [place], [place] [address]    Culture PENDING  Incomplete   Report Status PENDING  Incomplete  Culture, blood (Routine X 2) w Reflex to ID Panel     Status: None (Preliminary result)   Collection Time: 06/03/24  6:04 AM   Specimen: BLOOD  Result Value Ref Range Status   Specimen Description BLOOD LEFT ANTECUBITAL  Final   Special Requests   Final    BOTTLES DRAWN AEROBIC AND ANAEROBIC Blood Culture results may not be optimal due to an inadequate volume of blood received in culture bottles   Culture   Final    NO GROWTH < 12 HOURS Performed at [facility], [address] [place], [place] [address]    Report Status PENDING  Incomplete  Culture, blood (Routine X 2) w Reflex to ID Panel     Status: None (Preliminary result)  Collection Time: 06/03/24  6:04 AM   Specimen: BLOOD LEFT ARM  Result Value Ref Range Status   Specimen Description BLOOD LEFT ARM  Final   Special Requests   Final    BOTTLES DRAWN AEROBIC AND ANAEROBIC Blood Culture adequate volume   Culture   Final    NO GROWTH < 12 HOURS Performed at [facility], [address], [place], [place] [address]    Report Status PENDING  Incomplete     Medications:    [MAR Hold] Chlorhexidine  Gluconate Cloth  6 each Topical Q0600   [MAR Hold] feeding supplement  237 mL Oral BID BM   [MAR Hold] folic acid   1 mg Intravenous Daily   [MAR Hold] gabapentin   100 mg Oral BID   [MAR Hold] gentamicin  cream  1 Application Topical Daily   [MAR Hold] heparin   5,000 Units Subcutaneous Q8H   [MAR Hold] heparin  sodium (porcine) 1,000 Units in dialysis solution 1.5% low-MG/low-CA 2,000 mL dialysis solution   Peritoneal Dialysis Once   [facility]  Hold] ondansetron  (ZOFRAN ) IV  4 mg Intravenous Once   [MAR Hold] pantoprazole  (PROTONIX ) IV  40 mg Intravenous Q12H   [MAR Hold] thiamine  (VITAMIN B1) injection  100 mg Intravenous Daily   Continuous Infusions:  sodium chloride  10 mL/hr at 06/04/24 0740   [MAR Hold] ceFEPime  (MAXIPIME ) IV Stopped (06/03/24 0857)   [MAR Hold] dialysis solution 1.5% low-MG/low-CA        LOS: 1 day   Erle Odell Castor  [facility]  06/04/2024, 7:48 AM

## 2024-06-04 NOTE — Plan of Care (Signed)
  Problem: Education: Goal: Knowledge of General Education information will improve Description: Including pain rating scale, medication(s)/side effects and non-pharmacologic comfort measures Outcome: Progressing   Problem: Clinical Measurements: Goal: Will remain free from infection Outcome: Progressing   Problem: Clinical Measurements: Goal: Diagnostic test results will improve Outcome: Progressing   Problem: Clinical Measurements: Goal: Respiratory complications will improve Outcome: Progressing   Problem: Activity: Goal: Risk for activity intolerance will decrease Outcome: Progressing   Problem: Nutrition: Goal: Adequate nutrition will be maintained Outcome: Progressing

## 2024-06-04 NOTE — Progress Notes (Signed)
 Pharmacy Antibiotic Note  Joshua Maldonado is a 68 y.o. male admitted on 06/02/2024 with peritonitis.  Pharmacy has been consulted for vancomycin  dosing; also on cefepime .  -CTA: suspicious for peritonitis in association with peritoneal dialysis catheter -WBC 13, peritoneal dialysis (CAPD), afebrile -Fluid Cx and blood cultures ordered  PD cath removed and transitioning to HD. First HD was 8/30 PM for 3h. Expect vancomycin  level to be ~18 mcg/ml after HD (goal 15-25 mcg/ml). Next HD planned for 9/1. He is afebrile, WBC down to normal, peritoneal fluid growing GPC.   Plan: -Continue cefepime  1g IV q24h -Vancomycin  1000 mg IV after HD on 9/1 -F/U HD schedule and further doses of vancomycin  -Follow up signs of clinical improvement, LOT, de-escalation of antibiotics   Height: 5' 7 (170.2 cm) Weight: 82.2 kg (181 lb 3.5 oz) IBW/kg (Calculated) : 66.1  Temp (24hrs), Avg:98.3 F (36.8 C), Min:97.6 F (36.4 C), Max:98.9 F (37.2 C)  Recent Labs  Lab 06/02/24 2125 06/03/24 0605  WBC 13.2* 10.5  CREATININE 9.14* 8.79*    Estimated Creatinine Clearance: 8.6 mL/min (A) (by C-G formula based on SCr of 8.79 mg/dL (H)).    No Known Allergies  Antimicrobials this admission: Cefepime  8/30 >>  Vancomycin  8/30 >>   Microbiology results: 8/30 BCx: ngtd 8/30 peritoneal fluid: rare GPC clusters/pairs 8/31 PD cath tip: pending  Thank you for involving pharmacy in this patient's care.  Delon Sax, PharmD, BCPS Clinical Pharmacist Clinical phone for 06/04/2024 is [phone] 06/04/2024 12:13 PM

## 2024-06-04 NOTE — Transfer of Care (Signed)
 Immediate Anesthesia Transfer of Care Note  Patient: Joshua Maldonado  Procedure(s) Performed: CONTINUOUS AMBULATORY PERITONEAL DIALYSIS  (CAPD) CATHETER REMOVAL  Patient Location: PACU  Anesthesia Type:General  Level of Consciousness: drowsy and patient cooperative  Airway & Oxygen Therapy: Patient Spontanous Breathing and Patient connected to face mask oxygen  Post-op Assessment: Report given to RN and Post -op Vital signs reviewed and stable  Post vital signs: Reviewed and stable  Last Vitals:  Vitals Value Taken Time  BP 136/88 06/04/24 09:01  Temp    Pulse 93 06/04/24 09:02  Resp 16 06/04/24 09:02  SpO2 94 % 06/04/24 09:02  Vitals shown include unfiled device data.  Last Pain:  Vitals:   06/04/24 0700  TempSrc: Oral  PainSc:       Patients Stated Pain Goal: 0 (06/03/24 1017)  Complications: No notable events documented.

## 2024-06-04 NOTE — Progress Notes (Signed)
 Joshua Maldonado is an 68 y.o. male with HCV, HTN, GERD, HLD, paroxysmal SVT, OSA, ESRD on peritoneal dialysis followed by Dr. Rachele.  Patient has been on PD for approximately 3 to 4 months and this is his first episode of peritonitis.  Patient has never been on hemodialysis but has a rt BCF.  Abd pain Wednesday but improved for a day before returning with a vengeance w/ associated chills, vomiting and anorexia. Knot appear in the abdomen which is associated with worsening pain as well as constipation.    In the ED his blood pressure was 137/97 with a temperature of 98.9 and saturation 90%, bicarb 19 potassium 5 BUN/creatinine 80/9.14.  Count was 13.2.  CT abdomen was suggestive of peritonitis bowel and mesenteric inflammation as well as fluid and inflammation along the tunnel track.  Assessment/Plan: ESRD on peritoneal dialysis -Will try to infuse 2L this afternoon and see if there is any drain; if not we may need to convert to hemodialysis temporarily -> we had infusing to do 2.5 L with very little return.  Abdomen is fairly tense with possibly umbilical hernia patient becoming more tachycardic.-> appreciate CCM placing a RIJ temporary catheter and limited first treatment on Saturday.  Rt BCF not usable at this time.  - Appreciate Dr. Magda removing the PD cath and the patient already feeling much better.    Plan on dialysis tomorrow, contact renal navigator Monday to facilitate CLIP back to his home unit followed by Dr. Rachele.  Will also have the temporary catheter converted to a tunneled catheter early next week.  -Monitor Daily I/Os, Daily weight  -Maintain MAP>65 for optimal renal perfusion.  - Avoid nephrotoxic agents such as IV contrast, NSAIDs, and phosphate containing bowel preps (FLEETS)   Peritonitis -we did send for a cell count, Gram stain and culture but only little fluid returned.  We can try again with the next infusion when we test out the peritoneal dialysis catheter -> 351  nucleated cells/mm3 and 96% neutrophils.  Cultures pending. -Patient received cefepime  and vancomycin  starting 8/30  HTN - high likely from pain. Anemia - at goal, no ESA indicated OSA Cirrhosis of liver HCV   Subjective: Doing much better since catheter was removed this morning.  Denies fever, chills, nausea, vomiting or shortness of breath.  Tolerated HD yesterday   Chemistry and CBC: Creat  Date/Time Value Ref Range Status  01/31/2020 02:33 PM 1.84 (H) 0.70 - 1.25 mg/dL Final    Comment:    For patients >23 years of age, the reference limit for Creatinine is approximately 13% higher for people identified as African-American. .    Creatinine, Ser  Date/Time Value Ref Range Status  06/03/2024 06:05 AM 8.79 (H) 0.61 - 1.24 mg/dL Final  91/70/7974 90:74 PM 9.14 (H) 0.61 - 1.24 mg/dL Final  96/93/7974 93:68 AM 5.80 (H) 0.61 - 1.24 mg/dL Final  91/77/7977 97:80 PM 2.58 (H) 0.61 - 1.24 mg/dL Final  91/83/7977 98:97 PM 2.69 (H) 0.61 - 1.24 mg/dL Final  92/73/7977 98:64 PM 2.25 (H) 0.61 - 1.24 mg/dL Final  87/95/7978 93:83 AM 1.76 (H) 0.61 - 1.24 mg/dL Final  87/96/7978 95:69 AM 1.70 (H) 0.61 - 1.24 mg/dL Final  87/97/7978 96:49 AM 1.67 (H) 0.61 - 1.24 mg/dL Final  87/98/7978 91:54 AM 1.76 (H) 0.61 - 1.24 mg/dL Final  88/69/7978 94:82 AM 1.82 (H) 0.61 - 1.24 mg/dL Final  88/70/7978 98:54 PM 1.94 (H) 0.61 - 1.24 mg/dL Final  88/95/7978 98:78 PM 1.88 (H) 0.61 -  1.24 mg/dL Final  92/97/7978 88:60 AM 1.76 (H) 0.61 - 1.24 mg/dL Final  94/97/7978 98:48 PM 1.51 (H) 0.61 - 1.24 mg/dL Final  95/75/7978 97:40 PM 1.87 (H) 0.61 - 1.24 mg/dL Final   Recent Labs  Lab 06/02/24 2125 06/03/24 0605  NA 136  --   K 5.0  --   CL 102  --   CO2 19*  --   GLUCOSE 98  --   BUN 80*  --   CREATININE 9.14* 8.79*  CALCIUM  9.1  --    Recent Labs  Lab 06/02/24 2125 06/03/24 0605  WBC 13.2* 10.5  HGB 13.0 12.1*  HCT 39.5 36.8*  MCV 92.3 92.2  PLT 201 204   Liver Function Tests: Recent  Labs  Lab 06/02/24 2125  AST 65*  ALT 71*  ALKPHOS 116  BILITOT 0.9  PROT 7.9  ALBUMIN 3.0*   Recent Labs  Lab 06/02/24 2125  LIPASE 29   No results for input(s): AMMONIA in the last 168 hours. Cardiac Enzymes: No results for input(s): CKTOTAL, CKMB, CKMBINDEX, TROPONINI in the last 168 hours. Iron Studies: No results for input(s): IRON, TIBC, TRANSFERRIN, FERRITIN in the last 72 hours. PT/INR: @LABRCNTIP (inr:5)  Xrays/Other Studies: ) Results for orders placed or performed during the hospital encounter of 06/02/24 (from the past 48 hours)  Lipase, blood     Status: None   Collection Time: 06/02/24  9:25 PM  Result Value Ref Range   Lipase 29 11 - 51 U/L    Comment: Performed at [facility], [address] [place], [place] [address]  Comprehensive metabolic panel     Status: Abnormal   Collection Time: 06/02/24  9:25 PM  Result Value Ref Range   Sodium 136 135 - 145 mmol/L   Potassium 5.0 3.5 - 5.1 mmol/L   Chloride 102 98 - 111 mmol/L   CO2 19 (L) 22 - 32 mmol/L   Glucose, Bld 98 70 - 99 mg/dL    Comment: Glucose reference range applies only to samples taken after fasting for at least 8 hours.   BUN 80 (H) 8 - 23 mg/dL   Creatinine, Ser 0.85 (H) 0.61 - 1.24 mg/dL   Calcium  9.1 8.9 - 10.3 mg/dL   Total Protein 7.9 6.5 - 8.1 g/dL   Albumin 3.0 (L) 3.5 - 5.0 g/dL   AST 65 (H) 15 - 41 U/L   ALT 71 (H) 0 - 44 U/L   Alkaline Phosphatase 116 38 - 126 U/L   Total Bilirubin 0.9 0.0 - 1.2 mg/dL   GFR, Estimated 6 (L) >60 mL/min    Comment: (NOTE) Calculated using the CKD-EPI Creatinine Equation (2021)    Anion gap 15 5 - 15    Comment: Performed at [facility], [address] [place], [place] [address]  CBC     Status: Abnormal   Collection Time: 06/02/24  9:25 PM  Result Value Ref Range   WBC 13.2 (H) 4.0 - 10.5 K/uL   RBC 4.28 4.22 - 5.81 MIL/uL   Hemoglobin 13.0 13.0 - 17.0 g/dL   HCT 60.4 60.9 - 47.9 %   MCV 92.3 80.0 - 100.0  fL   MCH 30.4 26.0 - 34.0 pg   MCHC 32.9 30.0 - 36.0 g/dL   RDW 86.7 88.4 - 84.4 %   Platelets 201 150 - 400 K/uL   nRBC 0.0 0.0 - 0.2 %    Comment: Performed at [facility], [address] [place], [place] [address]  Urinalysis, Routine w reflex microscopic -Urine, Clean Catch     Status: Abnormal   Collection Time: 06/02/24 10:22 PM  Result Value Ref Range   Color, Urine YELLOW YELLOW   APPearance CLEAR CLEAR   Specific Gravity, Urine 1.013 1.005 - 1.030   pH 6.0 5.0 - 8.0   Glucose, UA NEGATIVE NEGATIVE mg/dL   Hgb urine dipstick NEGATIVE NEGATIVE   Bilirubin Urine NEGATIVE NEGATIVE   Ketones, ur NEGATIVE NEGATIVE mg/dL   Protein, ur >=699 (A) NEGATIVE mg/dL   Nitrite NEGATIVE NEGATIVE   Leukocytes,Ua NEGATIVE NEGATIVE   RBC / HPF 0-5 0 - 5 RBC/hpf   WBC, UA 6-10 0 - 5 WBC/hpf   Bacteria, UA RARE (A) NONE SEEN   Squamous Epithelial / HPF 0-5 0 - 5 /HPF    Comment: Performed at [facility], [address] [place], [place] [address]  Body fluid cell count with differential     Status: Abnormal   Collection Time: 06/03/24  4:41 AM  Result Value Ref Range   Fluid Type-FCT PERITONEAL FLUID     Comment: CORRECTED ON 08/30 AT 0946: PREVIOUSLY REPORTED AS PERITONEAL DIALYSATE   Color, Fluid AMBER (A) YELLOW   Appearance, Fluid TURBID (A) CLEAR   Total Nucleated Cell Count, Fluid 351 0 - 1,000 cu mm   Neutrophil Count, Fluid 96 (H) 0 - 25 %   Lymphs, Fluid 3 %   Eos, Fluid 1 %    Comment: Performed at [facility], [address], [place], [place] [address]  Body fluid culture w Gram Stain     Status: None (Preliminary result)   Collection Time: 06/03/24  4:43 AM   Specimen: Peritoneal Dialysate; Body Fluid  Result Value Ref Range   Specimen Description PERITONEAL DIALYSATE    Special Requests Normal    Gram Stain      RARE WBC PRESENT, PREDOMINANTLY PMN RARE GRAM POSITIVE COCCI IN CLUSTERS IN PAIRS CRITICAL RESULT CALLED TO, READ BACK BY AND VERIFIED  WITH: RN KETURAH MARINA [phone] AT 1438 BY EC Performed at [facility], [address], [place], [place] [address]    Culture PENDING    Report Status PENDING   Culture, blood (Routine X 2) w Reflex to ID Panel     Status: None (Preliminary result)   Collection Time: 06/03/24  6:04 AM   Specimen: BLOOD  Result Value Ref Range   Specimen Description BLOOD LEFT ANTECUBITAL    Special Requests      BOTTLES DRAWN AEROBIC AND ANAEROBIC Blood Culture results may not be optimal due to an inadequate volume of blood received in culture bottles   Culture      NO GROWTH 1 DAY Performed at [facility], [address], [place], [place] [address]    Report Status PENDING   Culture, blood (Routine X 2) w Reflex to ID Panel     Status: None (Preliminary result)   Collection Time: 06/03/24  6:04 AM   Specimen: BLOOD LEFT ARM  Result Value Ref Range   Specimen Description BLOOD LEFT ARM    Special Requests      BOTTLES DRAWN AEROBIC AND ANAEROBIC Blood Culture adequate volume   Culture      NO GROWTH 1 DAY Performed at [facility], [address], [place], [place] [address]    Report Status PENDING   HIV Antibody (routine testing w rflx)  Status: None   Collection Time: 06/03/24  6:05 AM  Result Value Ref Range   HIV Screen 4th Generation wRfx Non Reactive Non Reactive    Comment: Performed at [facility], [address] [place], [place] [address]  CBC     Status: Abnormal   Collection Time: 06/03/24  6:05 AM  Result Value Ref Range   WBC 10.5 4.0 - 10.5 K/uL   RBC 3.99 (L) 4.22 - 5.81 MIL/uL   Hemoglobin 12.1 (L) 13.0 - 17.0 g/dL   HCT 63.1 (L) 60.9 - 47.9 %   MCV 92.2 80.0 - 100.0 fL   MCH 30.3 26.0 - 34.0 pg   MCHC 32.9 30.0 - 36.0 g/dL   RDW 86.7 88.4 - 84.4 %   Platelets 204 150 - 400 K/uL   nRBC 0.0 0.0 - 0.2 %    Comment: Performed at [facility], [address], [place], [place] [address]  Creatinine, serum     Status: Abnormal    Collection Time: 06/03/24  6:05 AM  Result Value Ref Range   Creatinine, Ser 8.79 (H) 0.61 - 1.24 mg/dL   GFR, Estimated 6 (L) >60 mL/min    Comment: (NOTE) Calculated using the CKD-EPI Creatinine Equation (2021) Performed at [facility], [address] [place], [place] [address]   Hepatitis B surface antigen     Status: None   Collection Time: 06/03/24  3:05 PM  Result Value Ref Range   Hepatitis B Surface Ag NON REACTIVE NON REACTIVE    Comment: Performed at [facility], [address], [place], [place] [address]   DG Chest Port 1 View Result Date: 06/03/2024 CLINICAL DATA:  Central line placement EXAM: PORTABLE CHEST 1 VIEW COMPARISON:  Chest x-ray 02/04/2020 FINDINGS: Right-sided central venous catheter is new with distal tip projecting over the distal SVC. The cardiomediastinal silhouette is stable and within normal limits. Lung volumes are low likely accentuating central pulmonary vascularity. There some patchy retrocardiac opacities, unchanged. Questionable new nodular density in the right lung apex measuring 6 mm. There is no pleural effusion or pneumothorax. No acute fractures are seen. IMPRESSION: 1. Right-sided central venous catheter with distal tip projecting over the distal SVC. No pneumothorax. 2. Questionable new nodular density in the right lung apex measuring 6 mm. Recommend further evaluation with chest CT. Electronically Signed   By: Greig Pique M.D.   On: 06/03/2024 17:39   CT ABDOMEN PELVIS WO CONTRAST Result Date: 06/03/2024 CLINICAL DATA:  68 year old male with abdominal pain at peritoneal dialysis site. Vomiting and malaise. EXAM: CT ABDOMEN AND PELVIS WITHOUT CONTRAST TECHNIQUE: Multidetector CT imaging of the abdomen and pelvis was performed following the standard protocol without IV contrast. RADIATION DOSE REDUCTION: This exam was performed according to the departmental dose-optimization program which includes automated exposure control, adjustment of  the mA and/or kV according to patient size and/or use of iterative reconstruction technique. COMPARISON:  CT Abdomen and Pelvis 09/02/2020. FINDINGS: Lower chest: Mild but increased cardiomegaly since 2021. No pericardial or pleural effusion. Mild left lung base atelectasis. Hepatobiliary: Chronically nodular and cirrhotic liver. Small volume perihepatic free fluid with mostly simple fluid density. Mild gallbladder wall thickening. Pancreas: Negative noncontrast appearance. Spleen: Negative for splenomegaly. Small volume of perisplenic fluid with some complex fluid density. Adjacent left upper quadrant omental edema and/or inflammation. Adrenals/Urinary Tract: Negative adrenal glands. Negative kidneys. Diminutive ureters. Generalized urinary bladder wall thickening and adjacent edema or inflammation in the lower abdominal  mesentery (series 3, image 63). Incidental pelvic phleboliths. Stomach/Bowel: Fluid and inflammatory stranding along the subcutaneous course of the peritoneal dialysis catheter just to the right of the umbilicus series 3, image 49). The catheter is looped in the lower abdomen (series 3, image 59) situated amongst small bowel loops which appear indistinct, abnormally thickened. Small volume of free fluid in the left lower quadrant ranges from load a complex fluid density, and appears mildly spiculated, loculated (series 3, image 61). A small volume of free fluid in both pericolic gutters has more simple fluid density. No bowel transition point. Confluent mesenteric stranding in the left upper quadrant at the greater omentum (series 3, image 17). Nondilated appendix. Nondilated stomach. No pneumoperitoneum identified. Vascular/Lymphatic: Aortoiliac calcified atherosclerosis. Stable aortoiliac tortuosity, nonaneurysmal abdominal aorta. Reproductive: Negative. Other: Trace free fluid in the deep pelvis with mildly complex fluid density. Musculoskeletal: Chronic advanced lumbar spine degeneration and  previous lumbosacral spinal fusion. 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 Severe adjacent segment disease at L1-L2 with pronounced vacuum disc and endplate degeneration. Stable hardware. Chronic proximal left femur intramedullary rod partially visible. No acute osseous abnormality identified. IMPRESSION: 1. Constellation suspicious for Peritonitis in association with peritoneal dialysis catheter, including fluid and inflammation along the subcutaneous catheter course, bowel and mesenteric inflammation about the catheter, small volume of complex fluid in the abdomen and pelvis, and greater omental inflammation in the left upper quadrant, remote from the catheter site. 2. No organized or drainable fluid collection evident on this noncontrast exam. No discrete bowel obstruction. 3. Underlying chronic Cirrhotic liver. Mild cardiomegaly. Aortic Atherosclerosis (ICD10-I70.0). Electronically Signed   By: VEAR Hurst M.D.   On: 06/03/2024 04:09    PMH:   Past Medical History:  Diagnosis Date   Cirrhosis [facility])    Per patient, diagnosed at [facility].  Reports liver biopsy at [facility].  Suspected to be secondary to hepatitis C.and ETOH.    CKD (chronic kidney disease)    Dilation of biliary tract    Dilated CBD. MRI May 2021 suggesting benign variation.   GERD (gastroesophageal reflux disease)    Gout    Hepatitis C    Per patient, s/p treatment with Harvoni at [facility]; hep C RNA not detected April 2021.   HLD (hyperlipidemia)    Hypertension    LVH (left ventricular hypertrophy)    Moderate, normal EF.   Paroxysmal SVT (supraventricular tachycardia) (HCC)    Sleep apnea    uses CPAP occ    PSH:   Past Surgical History:  Procedure Laterality Date   AV FISTULA PLACEMENT Right 12/09/2023   Procedure: RIGHT ARM ARTERIOVENOUS (AV) FISTULA CREATION;  Surgeon: Serene Gaile ORN, MD;  Location: [facility] OR;  Service: Vascular;  Laterality: Right;  MAC, Regional block   BACK  SURGERY     FEMUR FRACTURE SURGERY Left    LIVER BIOPSY     Per patient- at [facility]   PERITONEAL CATHETER INSERTION      Allergies: No Known Allergies  Medications:   Prior to Admission medications   Medication Sig Start Date End Date Taking? Authorizing Provider  allopurinol  (ZYLOPRIM ) 100 MG tablet Take 100 mg by mouth daily as needed (gout).  04/05/20  Yes [provider]  colchicine  0.6 MG tablet TAKE 1 TABLET DAILY. PLEASE ATTEND SCHEDULED APPOINTMENT FOR ADDITIONAL REFILLS. 08/26/22  Yes Parthenia Olivia HERO, PA-C  DULoxetine (CYMBALTA) 30 MG capsule Take 30 mg by mouth daily. 07/12/20  Yes [provider]  gabapentin  (NEURONTIN ) 600 MG tablet Take 1 tablet (600 mg total) by mouth 2 (two) times daily as needed. 09/07/20  Yes Johnson, Clanford L, MD  Indomethacin  20 MG CAPS Take 1 capsule by mouth 3 (three) times daily as needed. 11/11/20  Yes Avegno, Komlanvi S, FNP  metoprolol  tartrate (LOPRESSOR ) 50 MG tablet Take 1 tablet (50 mg total) by mouth 2 (two) times daily. 07/27/22  Yes Mallipeddi, Vishnu P, MD  pantoprazole  (PROTONIX ) 40 MG tablet Take 1 tablet (40 mg total) by mouth 2 (two) times daily before a meal. 12/24/21  Yes Rudy, Kristen S, PA-C  sildenafil  (REVATIO ) 20 MG tablet Take 1-5 tablets as needed prior to sexual activity 09/20/23  Yes Nieves Cough, MD  amLODipine  (NORVASC ) 10 MG tablet Take 1 tablet (10 mg total) by mouth daily. Patient not taking: Reported on 01/31/2024 09/07/20   Vicci Afton CROME, MD  atorvastatin  (LIPITOR) 10 MG tablet Take 10 mg by mouth daily.    [provider]  furosemide  (LASIX ) 20 MG tablet Take 1 tablet (20 mg total) by mouth daily as needed for edema. 07/27/22   Mallipeddi, Vishnu P, MD  hydrALAZINE  (APRESOLINE ) 10 MG tablet Take 1 tablet (10 mg total) by mouth every 8 (eight) hours. Patient not taking: Reported on 01/31/2024 09/07/20   Vicci Afton CROME, MD  oxyCODONE -acetaminophen  (PERCOCET/ROXICET) 5-325 MG  tablet Take 1 tablet by mouth every 6 (six) hours as needed. Patient not taking: Reported on 01/31/2024 12/09/23   Gerome Maurilio HERO, PA-C  potassium chloride  (KLOR-CON ) 10 MEQ tablet Take 20 meq daily for 3 days then reduce to 10 meq Daily 04/22/21   Parthenia Olivia HERO, PA-C    Discontinued Meds:   Medications Discontinued During This Encounter  Medication Reason   gentamicin  cream (GARAMYCIN ) 0.1 % 1 Application Duplicate   dialysis solution 1.5% low-MG/low-CA dianeal solution Entry Error   heparin  sodium (porcine) injection 500 Units Entry Error   pantoprazole  (PROTONIX ) EC tablet 40 mg    morphine  (PF) 2 MG/ML injection 2 mg    heparin  injection 1,000-6,000 Units Entry Error   pentafluoroprop-tetrafluoroeth (GEBAUERS) aerosol 1 Application Patient Transfer   lidocaine  (PF) (XYLOCAINE ) 1 % injection 5 mL Patient Transfer   lidocaine -prilocaine  (EMLA ) cream 1 Application Patient Transfer   heparin  injection 1,000 Units Patient Transfer   anticoagulant sodium citrate  solution 5 mL Patient Transfer   alteplase  (CATHFLO ACTIVASE ) injection 2 mg Patient Transfer   0.9 % irrigation (POUR BTL) Patient Discharge    Social History:  reports that he has been smoking cigarettes. He has a 3 pack-year smoking history. He has never used smokeless tobacco. He reports that he does not currently use alcohol after a past usage of about 5.0 standard drinks of alcohol per week. He reports that he does not currently use drugs after having used the following drugs: Marijuana and Cocaine.  Family History:   Family History  Problem Relation Age of Onset   Hypertension Father    Colon cancer Neg Hx     Blood pressure (!) 138/95, pulse 86, temperature 98.3 F (36.8 C), resp. rate 14, height 5' 7 (1.702 m), weight 82.2 kg, SpO2 95%. Physical Exam: General appearance: NAD Head: NCAT Neck: no JVD Back: No CVA tenderness. Resp: CTA b/l Cardio: RRR GI: soft, non tender  Extremities: extremities normal,  atraumatic, no cyanosis or edema Pulses: 2+ and symmetric Access: Catheter on the right abdominal side; right upper arm brachiocephalic fistula very pulsatile but compressible with a weak systolic bruit,  RIJ temp     MELIA LYNWOOD ORN, MD 06/04/2024, 9:55 AM

## 2024-06-04 NOTE — Anesthesia Preprocedure Evaluation (Signed)
 Anesthesia Evaluation  Patient identified by MRN, date of birth, ID band Patient awake    Reviewed: Allergy & Precautions, NPO status , Patient's Chart, lab work & pertinent test results, reviewed documented beta blocker date and time   History of Anesthesia Complications Negative for: history of anesthetic complications  Airway Mallampati: III  TM Distance: >3 FB Neck ROM: Full    Dental  (+) Chipped, Missing, Dental Advisory Given   Pulmonary sleep apnea , Current Smoker and Patient abstained from smoking.   Pulmonary exam normal breath sounds clear to auscultation       Cardiovascular hypertension, Pt. on medications and Pt. on home beta blockers Normal cardiovascular exam+ dysrhythmias Supra Ventricular Tachycardia  Rhythm:Regular Rate:Normal  TTE 09/04/2020: 1. Left ventricular ejection fraction, by estimation, is 60 to 65%. The  left ventricle has normal function. The left ventricle has no regional  wall motion abnormalities. There is moderate left ventricular hypertrophy.  Left ventricular diastolic  parameters are indeterminate.   2. Right ventricular systolic function is normal. The right ventricular  size is normal.   3. The mitral valve is grossly normal. Trivial mitral valve  regurgitation.   4. The aortic valve is tricuspid. Aortic valve regurgitation is not  visualized.   5. Aortic dilatation noted. There is mild dilatation of the aortic root,  measuring 39 mm.   6. The inferior vena cava is normal in size with greater than 50%  respiratory variability, suggesting right atrial pressure of 3 mmHg    Neuro/Psych negative neurological ROS  negative psych ROS   GI/Hepatic ,GERD  ,,(+) Cirrhosis     substance abuse  alcohol use, Hepatitis -, C  Endo/Other  negative endocrine ROS    Renal/GU CRFRenal disease  negative genitourinary   Musculoskeletal negative musculoskeletal ROS (+)    Abdominal    Peds  Hematology negative hematology ROS (+)   Anesthesia Other Findings 68 year old male with pertinent history including HTN, HLD, chronic hepatitis C s/p treatment with Harvoni, GERD, PSVT in the setting of acute pancreatitis in 2021, OSA on CPAP, current smoker, CKD, history of cocaine abuse (reportedly 10/2018)  Reproductive/Obstetrics                              Anesthesia Physical Anesthesia Plan  ASA: 3  Anesthesia Plan: General   Post-op Pain Management:    Induction: Intravenous  PONV Risk Score and Plan: 1 and Ondansetron  and Dexamethasone   Airway Management Planned: LMA  Additional Equipment:   Intra-op Plan:   Post-operative Plan: Extubation in OR  Informed Consent: I have reviewed the patients History and Physical, chart, labs and discussed the procedure including the risks, benefits and alternatives for the proposed anesthesia with the patient or authorized representative who has indicated his/her understanding and acceptance.     Dental advisory given  Plan Discussed with: CRNA  Anesthesia Plan Comments: ( )         Anesthesia Quick Evaluation

## 2024-06-04 NOTE — Anesthesia Postprocedure Evaluation (Signed)
 Anesthesia Post Note  Patient: YAHEL FUSTON  Procedure(s) Performed: CONTINUOUS AMBULATORY PERITONEAL DIALYSIS  (CAPD) CATHETER REMOVAL     Patient location during evaluation: PACU Anesthesia Type: General Level of consciousness: awake and alert Pain management: pain level controlled Vital Signs Assessment: post-procedure vital signs reviewed and stable Respiratory status: spontaneous breathing, nonlabored ventilation, respiratory function stable and patient connected to nasal cannula oxygen Cardiovascular status: blood pressure returned to baseline and stable Postop Assessment: no apparent nausea or vomiting Anesthetic complications: no   No notable events documented.  Last Vitals:  Vitals:   06/04/24 1000 06/04/24 1029  BP: 126/88 (!) 148/100  Pulse: 82   Resp: 16   Temp: 36.8 C 36.9 C  SpO2: 95%     Last Pain:  Vitals:   06/04/24 1029  TempSrc: Oral  PainSc:                  Thom JONELLE Peoples

## 2024-06-04 NOTE — Progress Notes (Signed)
 [facility] OF [place] PROGRESS NOTE  ASSESSMENT / PLAN: Joshua Maldonado is a 68 y.o. male with PD catheter associated peritonitis. Plan removal of TDC in OR tomorrow. NPO after midnight. Orders for consent written.    SUBJECTIVE: No complaints. Reviewed plan for OR today.  OBJECTIVE: BP (!) 123/98   Pulse 93   Temp 97.8 F (36.6 C) (Oral)   Resp 20   Ht 5' 7 (1.702 m)   Wt 82.2 kg   SpO2 95%   BMI 28.38 kg/m   Intake/Output Summary (Last 24 hours) at 06/04/2024 0737 Last data filed at 06/03/2024 2245 Gross per 24 hour  Intake 100 ml  Output 1000 ml  Net -900 ml    No distress Regular rate and rhythm Unlabored breathing Mildly tender abdomen     Latest Ref Rng & Units 06/03/2024    6:05 AM 06/02/2024    9:25 PM 12/09/2023    6:31 AM  CBC  WBC 4.0 - 10.5 K/uL 10.5  13.2    Hemoglobin 13.0 - 17.0 g/dL 87.8  86.9  87.0   Hematocrit 39.0 - 52.0 % 36.8  39.5  38.0   Platelets 150 - 400 K/uL 204  201          Latest Ref Rng & Units 06/03/2024    6:05 AM 06/02/2024    9:25 PM 12/09/2023    6:31 AM  CMP  Glucose 70 - 99 mg/dL  98  72   BUN 8 - 23 mg/dL  80  59   Creatinine 9.38 - 1.24 mg/dL 1.20  0.85  4.19   Sodium 135 - 145 mmol/L  136  143   Potassium 3.5 - 5.1 mmol/L  5.0  4.1   Chloride 98 - 111 mmol/L  102  113   CO2 22 - 32 mmol/L  19    Calcium  8.9 - 10.3 mg/dL  9.1    Total Protein 6.5 - 8.1 g/dL  7.9    Total Bilirubin 0.0 - 1.2 mg/dL  0.9    Alkaline Phos 38 - 126 U/L  116    AST 15 - 41 U/L  65    ALT 0 - 44 U/L  71      Estimated Creatinine Clearance: 8.6 mL/min (A) (by C-G formula based on SCr of 8.79 mg/dL (H)).  Ned SAILOR. Magda, MD [facility] Phone Number: [phone] 06/04/2024 7:37 AM

## 2024-06-04 NOTE — Anesthesia Procedure Notes (Signed)
 Procedure Name: Intubation Date/Time: 06/04/2024 7:51 AM  Performed by: Cindie Donald CROME, CRNAPre-anesthesia Checklist: Patient identified, Emergency Drugs available, Suction available and Patient being monitored Patient Re-evaluated:Patient Re-evaluated prior to induction Oxygen Delivery Method: Circle System Utilized Preoxygenation: Pre-oxygenation with 100% oxygen Induction Type: IV induction Ventilation: Mask ventilation without difficulty Laryngoscope Size: Mac and 4 Grade View: Grade I Tube type: Oral Tube size: 7.5 mm Number of attempts: 1 Airway Equipment and Method: Stylet Placement Confirmation: ETT inserted through vocal cords under direct vision, positive ETCO2 and breath sounds checked- equal and bilateral Secured at: 22 cm Tube secured with: Tape Dental Injury: Teeth and Oropharynx as per pre-operative assessment

## 2024-06-05 DIAGNOSIS — T8571XA Infection and inflammatory reaction due to peritoneal dialysis catheter, initial encounter: Secondary | ICD-10-CM | POA: Diagnosis not present

## 2024-06-05 DIAGNOSIS — Z4889 Encounter for other specified surgical aftercare: Secondary | ICD-10-CM | POA: Diagnosis not present

## 2024-06-05 DIAGNOSIS — Z992 Dependence on renal dialysis: Secondary | ICD-10-CM | POA: Diagnosis not present

## 2024-06-05 DIAGNOSIS — K7460 Unspecified cirrhosis of liver: Secondary | ICD-10-CM | POA: Diagnosis not present

## 2024-06-05 DIAGNOSIS — N186 End stage renal disease: Secondary | ICD-10-CM | POA: Diagnosis not present

## 2024-06-05 LAB — POCT I-STAT, CHEM 8
BUN: 54 mg/dL — ABNORMAL HIGH (ref 8–23)
Calcium, Ion: 1.08 mmol/L — ABNORMAL LOW (ref 1.15–1.40)
Chloride: 100 mmol/L (ref 98–111)
Creatinine, Ser: 7 mg/dL — ABNORMAL HIGH (ref 0.61–1.24)
Glucose, Bld: 137 mg/dL — ABNORMAL HIGH (ref 70–99)
HCT: 47 % (ref 39.0–52.0)
Hemoglobin: 16 g/dL (ref 13.0–17.0)
Potassium: 4.9 mmol/L (ref 3.5–5.1)
Sodium: 137 mmol/L (ref 135–145)
TCO2: 25 mmol/L (ref 22–32)

## 2024-06-05 LAB — BODY FLUID CULTURE W GRAM STAIN: Special Requests: NORMAL

## 2024-06-05 LAB — RENAL FUNCTION PANEL
Albumin: 2.3 g/dL — ABNORMAL LOW (ref 3.5–5.0)
Anion gap: 18 — ABNORMAL HIGH (ref 5–15)
BUN: 77 mg/dL — ABNORMAL HIGH (ref 8–23)
CO2: 21 mmol/L — ABNORMAL LOW (ref 22–32)
Calcium: 7.5 mg/dL — ABNORMAL LOW (ref 8.9–10.3)
Chloride: 93 mmol/L — ABNORMAL LOW (ref 98–111)
Creatinine, Ser: 8.41 mg/dL — ABNORMAL HIGH (ref 0.61–1.24)
GFR, Estimated: 6 mL/min — ABNORMAL LOW (ref >60)
Glucose, Bld: 133 mg/dL — ABNORMAL HIGH (ref 70–99)
Phosphorus: 6.1 mg/dL — ABNORMAL HIGH (ref 2.5–4.6)
Potassium: 4.3 mmol/L (ref 3.5–5.1)
Sodium: 132 mmol/L — ABNORMAL LOW (ref 135–145)

## 2024-06-05 LAB — CBC
HCT: 37.9 % — ABNORMAL LOW (ref 39.0–52.0)
Hemoglobin: 12.5 g/dL — ABNORMAL LOW (ref 13.0–17.0)
MCH: 30.7 pg (ref 26.0–34.0)
MCHC: 33 g/dL (ref 30.0–36.0)
MCV: 93.1 fL (ref 80.0–100.0)
Platelets: 184 K/uL (ref 150–400)
RBC: 4.07 MIL/uL — ABNORMAL LOW (ref 4.22–5.81)
RDW: 13.3 % (ref 11.5–15.5)
WBC: 15.5 K/uL — ABNORMAL HIGH (ref 4.0–10.5)
nRBC: 0 % (ref 0.0–0.2)

## 2024-06-05 MED ORDER — VANCOMYCIN HCL IN DEXTROSE 1-5 GM/200ML-% IV SOLN
INTRAVENOUS | Status: AC
  Filled 2024-06-05: qty 200

## 2024-06-05 MED ORDER — ACETAMINOPHEN 325 MG PO TABS
650.0000 mg | ORAL_TABLET | Freq: Once | ORAL | Status: AC | PRN
  Administered 2024-06-05: 650 mg via ORAL
  Filled 2024-06-05: qty 2

## 2024-06-05 MED ORDER — HEPARIN SODIUM (PORCINE) 1000 UNIT/ML IJ SOLN
INTRAMUSCULAR | Status: AC
  Filled 2024-06-05: qty 3

## 2024-06-05 MED ORDER — PROSOURCE PLUS PO LIQD
30.0000 mL | Freq: Two times a day (BID) | ORAL | Status: DC
  Administered 2024-06-06 – 2024-06-09 (×4): 30 mL via ORAL
  Filled 2024-06-05 (×4): qty 30

## 2024-06-05 MED ORDER — SODIUM CHLORIDE (PF) 0.9 % IJ SOLN
INTRAMUSCULAR | Status: AC
  Filled 2024-06-05: qty 10

## 2024-06-05 MED ORDER — PANTOPRAZOLE SODIUM 40 MG PO TBEC
40.0000 mg | DELAYED_RELEASE_TABLET | Freq: Two times a day (BID) | ORAL | Status: DC
  Administered 2024-06-05 – 2024-06-09 (×8): 40 mg via ORAL
  Filled 2024-06-05 (×8): qty 1

## 2024-06-05 MED ORDER — CEFAZOLIN SODIUM-DEXTROSE 1-4 GM/50ML-% IV SOLN
1.0000 g | INTRAVENOUS | Status: DC
  Administered 2024-06-05: 1 g via INTRAVENOUS
  Filled 2024-06-05: qty 50

## 2024-06-05 MED ORDER — FOLIC ACID 1 MG PO TABS
1.0000 mg | ORAL_TABLET | Freq: Every day | ORAL | Status: DC
  Administered 2024-06-06 – 2024-06-09 (×4): 1 mg via ORAL
  Filled 2024-06-05 (×4): qty 1

## 2024-06-05 MED ORDER — THIAMINE MONONITRATE 100 MG PO TABS
100.0000 mg | ORAL_TABLET | Freq: Every day | ORAL | Status: DC
  Administered 2024-06-06 – 2024-06-09 (×4): 100 mg via ORAL
  Filled 2024-06-05 (×4): qty 1

## 2024-06-05 MED ORDER — HEPARIN SODIUM (PORCINE) 1000 UNIT/ML IJ SOLN
2400.0000 [IU] | Freq: Once | INTRAMUSCULAR | Status: AC
  Administered 2024-06-05: 2400 [IU]

## 2024-06-05 NOTE — Plan of Care (Signed)

## 2024-06-05 NOTE — Plan of Care (Signed)

## 2024-06-05 NOTE — Progress Notes (Signed)
 Received patient in bed to unit.  Alert and oriented.  Informed consent signed and in chart.   TX duration:3 hours and 30 minutes  Patient tolerated well.  Transported back to the room  Alert, without acute distress.  Hand-off given to patient's nurse.   Access used: R internal jugular HD Cath Access issues: none  Total UF removed: 2L Medication(s) given: none   06/05/24 1734  Vitals  Temp 97.9 F (36.6 C)  Temp Source Oral  BP 128/86  MAP (mmHg) 97  Pulse Rate 97  ECG Heart Rate (!) 102  Resp 19  Weight 79.3 kg  Type of Weight Post-Dialysis  Oxygen Therapy  SpO2 100 %  O2 Device Nasal Cannula  O2 Flow Rate (L/min) 2 L/min  During Treatment Monitoring  Duration of HD Treatment -hour(s) 3.5 hour(s)  HD Safety Checks Performed Yes  Intra-Hemodialysis Comments Tx completed  Post Treatment  Dialyzer Clearance Heavily streaked  Liters Processed 84  Fluid Removed (mL) 2000 mL  Tolerated HD Treatment Yes  Hemodialysis Catheter Right Internal jugular Triple lumen Temporary (Non-Tunneled)  Placement Date/Time: 06/03/24 1644   Time Out: Correct patient;Correct site;Correct procedure  Maximum sterile barrier precautions: Hand hygiene;Sterile gown;Sterile probe cover;Sterile gloves;Large sterile sheet;Mask;Cap  Site Prep: Chlorhexidine  (pr...  Site Condition No complications  Blue Lumen Status Flushed;Antimicrobial dead end cap;Heparin  locked  Red Lumen Status Flushed;Antimicrobial dead end cap;Heparin  locked  Purple Lumen Status Saline locked  Catheter fill solution Heparin  1000 units/ml  Catheter fill volume (Venous) 1.2  Dressing Type Transparent  Dressing Status Antimicrobial disc/dressing in place;Clean, Dry, Intact  Drainage Description None  Dressing Change Due 06/12/24  Post treatment catheter status Capped and Clamped     Camellia Brasil LPN [facility]

## 2024-06-05 NOTE — Op Note (Addendum)
 DATE OF SERVICE: 06/05/2024  PATIENT:  Joshua Maldonado  68 y.o. male  PRE-OPERATIVE DIAGNOSIS:  PD peritonitis  POST-OPERATIVE DIAGNOSIS:  Same  PROCEDURE:   Removal of PD catheter (CPT [phone])  SURGEON:  Surgeons and Role:    * Joshua Maldonado SAILOR, MD - Primary  ASSISTANT: Curry Damme, PA-C  An experienced assistant was required given the complexity of this procedure and the standard of surgical care. My assistant helped with exposure through counter tension, suctioning, ligation and retraction to better visualize the surgical field.  My assistant expedited sewing during the case by following my sutures. Wherever I use the term we in the report, my assistant actively helped me with that portion of the procedure.  ANESTHESIA:   general  EBL: min  BLOOD ADMINISTERED:none  DRAINS: none   LOCAL MEDICATIONS USED:  NONE  SPECIMEN:  none  COUNTS: confirmed correct.  TOURNIQUET:  none  PATIENT DISPOSITION:  PACU - hemodynamically stable.   Delay start of Pharmacological VTE agent (>24hrs) due to surgical blood loss or risk of bleeding: no  INDICATION FOR PROCEDURE: Joshua Maldonado is a 68 y.o. male with pd catheter peritonitis. After careful discussion of risks, benefits, and alternatives the patient was offered pd catheter removal. The patient understood and wished to proceed.  OPERATIVE FINDINGS: purulent fluid in catheter. Catheter removed in its entirety.   DESCRIPTION OF PROCEDURE: After identification of the patient in the pre-operative holding area, the patient was transferred to the operating room. The patient was positioned supine on the operating room table. Anesthesia was induced. The abdomen was prepped and draped in standard fashion. A surgical pause was performed confirming correct patient, procedure, and operative location.  The PD catheter was evaluated with intraoperative ultrasound.  Locations of the cuff were marked on the skin.  Transverse incisions were made  over these areas.  First we exposed the rectus fascia cuff.  This was removed and the intra-abdominal portion of the catheter removed easily.  The catheter did appear infected.  Next then moved to the subxiphoid abdomen where 2 cuffs were present.  I freed these.  I could not free the cuff in the midportion between these 2 incisions so an additional transverse incision was made.  Once all cuffs were freed I disconnected the catheter at the Christmas tree adapter.  The intra-abdominal portion was removed from the abdominal wall and sent for culture.  The area swan-neck attachment was then freed from the abdominal wall and removed intact.  The wounds were closed in layers using 3-0 Vicryl and 4-0 Monocryl.  Upon completion of the case instrument and sharps counts were confirmed correct. The patient was transferred to the PACU in good condition. I was present for all portions of the procedure.  FOLLOW UP PLAN: follow up 6 weeks to evaluate for permanent access.  Maldonado Maldonado. Magda, MD [facility] of [facility] Phone Number: [phone] 06/05/2024 8:12 AM

## 2024-06-05 NOTE — Progress Notes (Addendum)
  Progress Note    06/05/2024 10:17 AM 1 Day Post-Op  Subjective:  overall feeling okay, frustrated regarding not being able to continue PD at this time   Vitals:   06/05/24 0403 06/05/24 0811  BP: (!) 152/121 (!) 123/101  Pulse: 75 90  Resp: 17 19  Temp: 98.6 F (37 C) 98.2 F (36.8 C)  SpO2: 100% 97%   Physical Exam: Cardiac:  regular Lungs:  non labored Incisions: Abdominal incisions are intact and well appearing, without drainage Abdomen:  mildly distended, non tender Neurologic: alert and oriented   CBC    Component Value Date/Time   WBC 10.5 06/03/2024 0605   RBC 3.99 (L) 06/03/2024 0605   HGB 12.1 (L) 06/03/2024 0605   HCT 36.8 (L) 06/03/2024 0605   PLT 204 06/03/2024 0605   MCV 92.2 06/03/2024 0605   MCH 30.3 06/03/2024 0605   MCHC 32.9 06/03/2024 0605   RDW 13.2 06/03/2024 0605   LYMPHSABS 0.9 05/20/2021 1302   MONOABS 0.9 05/20/2021 1302   EOSABS 0.0 05/20/2021 1302   BASOSABS 0.0 05/20/2021 1302    BMET    Component Value Date/Time   NA 136 06/02/2024 2125   K 5.0 06/02/2024 2125   CL 102 06/02/2024 2125   CO2 19 (L) 06/02/2024 2125   GLUCOSE 98 06/02/2024 2125   BUN 80 (H) 06/02/2024 2125   CREATININE 8.79 (H) 06/03/2024 0605   CREATININE 1.84 (H) 01/31/2020 1433   CALCIUM  9.1 06/02/2024 2125   GFRNONAA 6 (L) 06/03/2024 0605   GFRNONAA 39 (L) 01/31/2020 1433   GFRAA 47 (L) 04/05/2020 1139   GFRAA 45 (L) 01/31/2020 1433    INR    Component Value Date/Time   INR 1.0 02/01/2023 0630     Intake/Output Summary (Last 24 hours) at 06/05/2024 1017 Last data filed at 06/05/2024 0736 Gross per 24 hour  Intake 50 ml  Output 200 ml  Net -150 ml     Assessment/Plan:  68 y.o. male is s/p  Removal of PD catheter 1 Day Post-Op   PD incisions all intact and well appearing Patient feeling better overall. No leukocytosis. Afebrile  Will arrange follow up in about 6 weeks with Dr. Serene to discuss access options going forward   Teretha Damme, [place] [facility] [phone] 06/05/2024 10:17 AM  VASCULAR STAFF ADDENDUM: I have independently interviewed and examined the patient. I agree with the above.   Debby SAILOR. Magda, MD [facility] of [facility] Phone Number: [phone] 06/05/2024 10:49 AM

## 2024-06-05 NOTE — Progress Notes (Addendum)
 [facility] PROGRESS NOTE    Progress Note  Joshua Maldonado  FMW:[id number] DOB: 12-17-1955 DOA: 06/02/2024 PCP: Branda Hair, NP   Brief Narrative:   Joshua Maldonado is an 68 y.o. male past medical history significant for liver cirrhosis, end-stage renal disease on peritoneal dialysis essential hypertension paroxysmal SVT comes in complaining of diffuse abdominal pain which began 7 days prior to admission, accompanied by weakness and fevers.  In the ED was found afebrile, potassium of 5 white blood cell count of 13, and CT scan of the abdomen and pelvis was suspicious for peritonitis with associated peritoneal dialysis catheter and inflammatory changes along the subcutaneous area  Assessment/Plan:   Sepsis most likely due to peritonitis associated with peritoneal dialysis (HCC) Blood cultures were obtained on 06/03/2024, started empirically on IV vancomycin  and cefepime . Peritoneal fluid Gram stain showed gram-positive cocci in clusters currently on vancomycin  and cefepime . Tmax of 99, leukocytosis resolved. Vascular surgery was consulted status post PD catheter removal on 06/04/2024. Blood cultures remain negative till date. Has not had a bowel movement, has not passed gas today, he did had  flatus yesterday, abdomen is distended, nontender  End-stage renal disease on hemodialysis: Nephrology has been consulted, transition to hemodialysis. For HD today.  Appreciate renal's assistance.  Hyperlipidemia: Agree with holding statins.  Cirrhosis of the liver: Noted.  History of hepatitis C Noted  DVT prophylaxis: lovenox Family Communication:family Status is: Inpatient Remains inpatient appropriate because: Sepsis    Code Status:     Code Status Orders  (From admission, onward)           Start     Ordered   06/03/24 0540  Full code  Continuous       Question:  By:  Answer:  Consent: discussion documented in EHR   06/03/24 0540           Code Status  History     Date Active Date Inactive Code Status Order ID Comments User Context   02/01/2023 0844 02/02/2023 0517 Full Code [id number]  Alona Corners, DO HOV   09/02/2020 2201 09/07/2020 1624 Full Code [id number]  Manfred Driver, DO ED         IV Access:   Peripheral IV   Procedures and diagnostic studies:   DG Chest Port 1 View Result Date: 06/03/2024 CLINICAL DATA:  Central line placement EXAM: PORTABLE CHEST 1 VIEW COMPARISON:  Chest x-ray 02/04/2020 FINDINGS: Right-sided central venous catheter is new with distal tip projecting over the distal SVC. The cardiomediastinal silhouette is stable and within normal limits. Lung volumes are low likely accentuating central pulmonary vascularity. There some patchy retrocardiac opacities, unchanged. Questionable new nodular density in the right lung apex measuring 6 mm. There is no pleural effusion or pneumothorax. No acute fractures are seen. IMPRESSION: 1. Right-sided central venous catheter with distal tip projecting over the distal SVC. No pneumothorax. 2. Questionable new nodular density in the right lung apex measuring 6 mm. Recommend further evaluation with chest CT. Electronically Signed   By: Greig Pique M.D.   On: 06/03/2024 17:39     Medical Consultants:   None.   Subjective:    Joshua Maldonado relates his pain is controlled tolerating his diet has not had a bowel movement or passing gas  Objective:    Vitals:   06/04/24 1622 06/04/24 1950 06/05/24 0403 06/05/24 0811  BP: 107/80 117/70 (!) 152/121 (!) 123/101  Pulse: 84 92 75 90  Resp:  18 17 19   Temp:  98 F (36.7 C) 99 F (37.2 C) 98.6 F (37 C) 98.2 F (36.8 C)  TempSrc: Oral Oral Oral   SpO2: 94%  100% 97%  Weight:      Height:       SpO2: 97 % O2 Flow Rate (L/min): 4 L/min   Intake/Output Summary (Last 24 hours) at 06/05/2024 0947 Last data filed at 06/05/2024 0736 Gross per 24 hour  Intake 50 ml  Output 200 ml  Net -150 ml   Filed Weights   06/03/24  0307 06/03/24 1918 06/03/24 2245  Weight: 83.9 kg 83.2 kg 82.2 kg    Exam: General exam: In no acute distress. Respiratory system: Good air movement and clear to auscultation. Cardiovascular system: S1 & S2 heard, RRR. No JVD. Gastrointestinal system: Diminished soft, distended mildly tender. Extremities: No pedal edema. Skin: No rashes, lesions or ulcers Psychiatry: Judgement and insight appear normal. Mood & affect appropriate.  Data Reviewed:    Labs: Basic Metabolic Panel: Recent Labs  Lab 06/02/24 2125 06/03/24 0605  NA 136  --   K 5.0  --   CL 102  --   CO2 19*  --   GLUCOSE 98  --   BUN 80*  --   CREATININE 9.14* 8.79*  CALCIUM  9.1  --    GFR Estimated Creatinine Clearance: 8.6 mL/min (A) (by C-G formula based on SCr of 8.79 mg/dL (H)). Liver Function Tests: Recent Labs  Lab 06/02/24 2125  AST 65*  ALT 71*  ALKPHOS 116  BILITOT 0.9  PROT 7.9  ALBUMIN 3.0*   Recent Labs  Lab 06/02/24 2125  LIPASE 29   No results for input(s): AMMONIA in the last 168 hours. Coagulation profile No results for input(s): INR, PROTIME in the last 168 hours. COVID-19 Labs  No results for input(s): DDIMER, FERRITIN, LDH, CRP in the last 72 hours.  Lab Results  Component Value Date   SARSCOV2NAA NEGATIVE 09/02/2020   SARSCOV2NAA NEGATIVE 08/08/2020   SARSCOV2NAA NEGATIVE 04/05/2020    CBC: Recent Labs  Lab 06/02/24 2125 06/03/24 0605  WBC 13.2* 10.5  HGB 13.0 12.1*  HCT 39.5 36.8*  MCV 92.3 92.2  PLT 201 204   Cardiac Enzymes: No results for input(s): CKTOTAL, CKMB, CKMBINDEX, TROPONINI in the last 168 hours. BNP (last 3 results) No results for input(s): PROBNP in the last 8760 hours. CBG: No results for input(s): GLUCAP in the last 168 hours. D-Dimer: No results for input(s): DDIMER in the last 72 hours. Hgb A1c: No results for input(s): HGBA1C in the last 72 hours. Lipid Profile: No results for input(s): CHOL, HDL,  LDLCALC, TRIG, CHOLHDL, LDLDIRECT in the last 72 hours. Thyroid function studies: No results for input(s): TSH, T4TOTAL, T3FREE, THYROIDAB in the last 72 hours.  Invalid input(s): FREET3 Anemia work up: No results for input(s): VITAMINB12, FOLATE, FERRITIN, TIBC, IRON, RETICCTPCT in the last 72 hours. Sepsis Labs: Recent Labs  Lab 06/02/24 2125 06/03/24 0605  WBC 13.2* 10.5   Microbiology Recent Results (from the past 240 hours)  Body fluid culture w Gram Stain     Status: None (Preliminary result)   Collection Time: 06/03/24  4:43 AM   Specimen: Peritoneal Dialysate; Body Fluid  Result Value Ref Range Status   Specimen Description PERITONEAL DIALYSATE  Final   Special Requests Normal  Final   Gram Stain   Final    RARE WBC PRESENT, PREDOMINANTLY PMN RARE GRAM POSITIVE COCCI IN CLUSTERS IN PAIRS CRITICAL RESULT CALLED TO, READ BACK  BY AND VERIFIED WITH: RN KETURAH ROBERTSON [id number] AT 1438 BY EC    Culture   Final    MODERATE STAPHYLOCOCCUS AUREUS SUSCEPTIBILITIES TO FOLLOW Performed at [facility], [address] [place], [place] [address]    Report Status PENDING  Incomplete  Culture, blood (Routine X 2) w Reflex to ID Panel     Status: None (Preliminary result)   Collection Time: 06/03/24  6:04 AM   Specimen: BLOOD  Result Value Ref Range Status   Specimen Description BLOOD LEFT ANTECUBITAL  Final   Special Requests   Final    BOTTLES DRAWN AEROBIC AND ANAEROBIC Blood Culture results may not be optimal due to an inadequate volume of blood received in culture bottles   Culture   Final    NO GROWTH 2 DAYS Performed at [facility], [address], [place], [place] [address]    Report Status PENDING  Incomplete  Culture, blood (Routine X 2) w Reflex to ID Panel     Status: None (Preliminary result)   Collection Time: 06/03/24  6:04 AM   Specimen: BLOOD LEFT ARM  Result Value Ref Range Status   Specimen Description BLOOD LEFT  ARM  Final   Special Requests   Final    BOTTLES DRAWN AEROBIC AND ANAEROBIC Blood Culture adequate volume   Culture   Final    NO GROWTH 2 DAYS Performed at [facility], [address] [place], [place] [address]    Report Status PENDING  Incomplete  Aerobic/Anaerobic Culture w Gram Stain (surgical/deep wound)     Status: None (Preliminary result)   Collection Time: 06/04/24  8:27 AM   Specimen: Catheter Tip; Other  Result Value Ref Range Status   Specimen Description CATH TIP  Final   Special Requests NONE  Final   Gram Stain FEW WBC SEEN NO ORGANISMS SEEN   Final   Culture   Final    CULTURE REINCUBATED FOR BETTER GROWTH Performed at [facility], [address] [place], [place] [address]    Report Status PENDING  Incomplete     Medications:    Chlorhexidine  Gluconate Cloth  6 each Topical Q0600   feeding supplement  237 mL Oral BID BM   folic acid   1 mg Intravenous Daily   gabapentin   100 mg Oral BID   gentamicin  cream  1 Application Topical Daily   heparin   5,000 Units Subcutaneous Q8H   heparin  sodium (porcine) 1,000 Units in dialysis solution 1.5% low-MG/low-CA 2,000 mL dialysis solution   Peritoneal Dialysis Once   ondansetron  (ZOFRAN ) IV  4 mg Intravenous Once   pantoprazole  (PROTONIX ) IV  40 mg Intravenous Q12H   thiamine  (VITAMIN B1) injection  100 mg Intravenous Daily   Continuous Infusions:  ceFEPime  (MAXIPIME ) IV 1 g (06/04/24 1303)   dialysis solution 1.5% low-MG/low-CA     vancomycin         LOS: 2 days   Joshua Maldonado  [facility]  06/05/2024, 9:47 AM

## 2024-06-05 NOTE — Progress Notes (Signed)
 [facility] Progress Note   Subjective:   Seen in room. No CP/dyspnea. No abd pain and incisions healing s/p PD cath removal. He has a lot of questions about work and HD moving forward, he would like to return to PD in the future if possible. Our renal SW (who manages CLIP/HD placement is off today for the holiday - BUT I called his home PD unit and they know him well and will start getting things in place for him to have iHD there).  Objective Vitals:   06/04/24 1622 06/04/24 1950 06/05/24 0403 06/05/24 0811  BP: 107/80 117/70 (!) 152/121 (!) 123/101  Pulse: 84 92 75 90  Resp:  18 17 19   Temp: 98 F (36.7 C) 99 F (37.2 C) 98.6 F (37 C) 98.2 F (36.8 C)  TempSrc: Oral Oral Oral   SpO2: 94%  100% 97%  Weight:      Height:       Physical Exam General: Well appearing man, NAD. Room air. Heart:RRR; no murmur Lungs: CTAB, no rales Abdomen: abd mildly tender, PD cath out - 4 dry bandages Extremities: no LE edema Dialysis Access: Temp HD line in R internal jugular, RUE AVF +t/b but pulsatile  Additional Objective Labs: Basic Metabolic Panel: Recent Labs  Lab 06/02/24 2125 06/03/24 0605 06/04/24 0714  NA 136  --  137  K 5.0  --  4.9  CL 102  --  100  CO2 19*  --   --   GLUCOSE 98  --  137*  BUN 80*  --  54*  CREATININE 9.14* 8.79* 7.00*  CALCIUM  9.1  --   --    Liver Function Tests: Recent Labs  Lab 06/02/24 2125  AST 65*  ALT 71*  ALKPHOS 116  BILITOT 0.9  PROT 7.9  ALBUMIN 3.0*   Recent Labs  Lab 06/02/24 2125  LIPASE 29   CBC: Recent Labs  Lab 06/02/24 2125 06/03/24 0605 06/04/24 0714  WBC 13.2* 10.5  --   HGB 13.0 12.1* 16.0  HCT 39.5 36.8* 47.0  MCV 92.3 92.2  --   PLT 201 204  --    Studies/Results: DG Chest Port 1 View Result Date: 06/03/2024 CLINICAL DATA:  Central line placement EXAM: PORTABLE CHEST 1 VIEW COMPARISON:  Chest x-ray 02/04/2020 FINDINGS: Right-sided central venous catheter is new with distal tip projecting  over the distal SVC. The cardiomediastinal silhouette is stable and within normal limits. Lung volumes are low likely accentuating central pulmonary vascularity. There some patchy retrocardiac opacities, unchanged. Questionable new nodular density in the right lung apex measuring 6 mm. There is no pleural effusion or pneumothorax. No acute fractures are seen. IMPRESSION: 1. Right-sided central venous catheter with distal tip projecting over the distal SVC. No pneumothorax. 2. Questionable new nodular density in the right lung apex measuring 6 mm. Recommend further evaluation with chest CT. Electronically Signed   By: Greig Pique M.D.   On: 06/03/2024 17:39   Medications:  ceFEPime  (MAXIPIME ) IV 1 g (06/04/24 1303)   dialysis solution 1.5% low-MG/low-CA     vancomycin       Chlorhexidine  Gluconate Cloth  6 each Topical Q0600   feeding supplement  237 mL Oral BID BM   folic acid   1 mg Intravenous Daily   gabapentin   100 mg Oral BID   gentamicin  cream  1 Application Topical Daily   heparin   5,000 Units Subcutaneous Q8H   heparin  sodium (porcine) 1,000 Units in dialysis solution 1.5% low-MG/low-CA  2,000 mL dialysis solution   Peritoneal Dialysis Once   ondansetron  (ZOFRAN ) IV  4 mg Intravenous Once   pantoprazole  (PROTONIX ) IV  40 mg Intravenous Q12H   thiamine  (VITAMIN B1) injection  100 mg Intravenous Daily   Dialysis Orders Prev on CCPD  Assessment/Plan: Peritonitis with PD catheter dysfunction: PD fluid with cell count 351, Cx growing Staph aureus. Severe pain on admit, much improved. In addition to the infection, also was having issues getting PD cath to flush - therefore decision was made to remove PD cath - done on 8/31. On Vanc/Cefepime  for now. ESRD: Temp HD line placed on 8/20 and s/p HD that day. PD cath is now out. Plan will be HD temporarily, but hopefully will be able to resume PD in the future. 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 Will  formally initiate CLIP tomorrow (today is holiday), but I have called him usual providers and they are aware. For HD today. I spoke to Dr. Magda with [facility] about his AVF - he thinks that likely needs more than f'gram, likely revision. Will ask IR to place tunneled HD line for now.  HTN/volume: BP variable, no LE edema. UF as tolerated. 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 Secondary HPTH: Ca ok, Phos pending. Nutrition: Alb low, will add protein supplements.   Izetta Boehringer, PA-C 06/05/2024, 12:39 PM  [facility]

## 2024-06-06 ENCOUNTER — Encounter (HOSPITAL_COMMUNITY): Payer: Self-pay | Admitting: Vascular Surgery

## 2024-06-06 ENCOUNTER — Inpatient Hospital Stay (HOSPITAL_COMMUNITY)

## 2024-06-06 DIAGNOSIS — N186 End stage renal disease: Secondary | ICD-10-CM | POA: Diagnosis not present

## 2024-06-06 DIAGNOSIS — T8571XA Infection and inflammatory reaction due to peritoneal dialysis catheter, initial encounter: Secondary | ICD-10-CM | POA: Diagnosis not present

## 2024-06-06 DIAGNOSIS — K7460 Unspecified cirrhosis of liver: Secondary | ICD-10-CM | POA: Diagnosis not present

## 2024-06-06 DIAGNOSIS — Z992 Dependence on renal dialysis: Secondary | ICD-10-CM | POA: Diagnosis not present

## 2024-06-06 LAB — PATHOLOGIST SMEAR REVIEW

## 2024-06-06 MED ORDER — SODIUM CHLORIDE 0.9 % IV SOLN
1.5000 g | INTRAVENOUS | Status: DC
  Administered 2024-06-07 – 2024-06-09 (×3): 1.5 g via INTRAVENOUS
  Filled 2024-06-06 (×3): qty 4

## 2024-06-06 MED ORDER — AMOXICILLIN-POT CLAVULANATE 500-125 MG PO TABS: 1.0000 | ORAL_TABLET | Freq: Three times a day (TID) | ORAL | Status: DC

## 2024-06-06 MED ORDER — DOXYCYCLINE HYCLATE 100 MG PO TABS: 100.0000 mg | ORAL_TABLET | Freq: Two times a day (BID) | ORAL | Status: DC

## 2024-06-06 MED ORDER — SODIUM CHLORIDE 0.9 % IV SOLN
1.5000 g | Freq: Two times a day (BID) | INTRAVENOUS | Status: DC
  Administered 2024-06-06: 1.5 g via INTRAVENOUS
  Filled 2024-06-06: qty 4

## 2024-06-06 MED ORDER — POLYETHYLENE GLYCOL 3350 17 G PO PACK
17.0000 g | PACK | Freq: Two times a day (BID) | ORAL | Status: DC
  Administered 2024-06-06 – 2024-06-07 (×2): 17 g via ORAL
  Filled 2024-06-06 (×2): qty 1

## 2024-06-06 MED ORDER — LINEZOLID 600 MG PO TABS
600.0000 mg | ORAL_TABLET | Freq: Two times a day (BID) | ORAL | Status: DC
  Filled 2024-06-06: qty 1

## 2024-06-06 MED ORDER — SODIUM CHLORIDE 0.9 % IV SOLN
100.0000 mg | Freq: Two times a day (BID) | INTRAVENOUS | Status: DC
  Administered 2024-06-06 – 2024-06-08 (×6): 100 mg via INTRAVENOUS
  Filled 2024-06-06 (×7): qty 100

## 2024-06-06 MED ORDER — AMOXICILLIN-POT CLAVULANATE 500-125 MG PO TABS: 1.0000 | ORAL_TABLET | Freq: Every day | ORAL | Status: DC

## 2024-06-06 NOTE — Plan of Care (Signed)
 Awaiting CT scan results. Difficulty with PO intake-pain,belching,hiccups with PO intake. Pt did get OOB.   Problem: Education: Goal: Knowledge of General Education information will improve Description: Including pain rating scale, medication(s)/side effects and non-pharmacologic comfort measures Outcome: Progressing   Problem: Health Behavior/Discharge Planning: Goal: Ability to manage health-related needs will improve Outcome: Progressing   Problem: Clinical Measurements: Goal: Ability to maintain clinical measurements within normal limits will improve Outcome: Progressing Goal: Will remain free from infection Outcome: Not Progressing Goal: Diagnostic test results will improve Outcome: Not Progressing Goal: Respiratory complications will improve Outcome: Progressing Goal: Cardiovascular complication will be avoided Outcome: Progressing   Problem: Activity: Goal: Risk for activity intolerance will decrease Outcome: Progressing   Problem: Nutrition: Goal: Adequate nutrition will be maintained Outcome: Not Progressing   Problem: Coping: Goal: Level of anxiety will decrease Outcome: Progressing   Problem: Elimination: Goal: Will not experience complications related to bowel motility Outcome: Progressing Goal: Will not experience complications related to urinary retention Outcome: Progressing   Problem: Pain Managment: Goal: General experience of comfort will improve and/or be controlled Outcome: Progressing   Problem: Safety: Goal: Ability to remain free from injury will improve Outcome: Progressing   Problem: Skin Integrity: Goal: Risk for impaired skin integrity will decrease Outcome: Progressing

## 2024-06-06 NOTE — Progress Notes (Signed)
 [name] Progress Note   Subjective:   Seen in room. Febrile yesterday and abd pain with eating - has been ordered for abd CT with contrast. Both PD fluid and PD cath tip grew MSSA, abx were tapered to IV Cefazolin  but then broadened again to Unasyn  and doxycycline . CLIP process will be started today for HD placement. Spoke to IR for conversion of temp to tunneled catheter - will have temp line pulled today and they will place a tunneled line on Thursday morning.  Objective Vitals:   06/05/24 2012 06/05/24 2332 06/06/24 0555 06/06/24 0716  BP: (!) 133/97  (!) 125/96 (!) 133/91  Pulse: 94  99 99  Resp: 18  18 18   Temp: (!) 101.1 F (38.4 C) 98.9 F (37.2 C) 98.7 F (37.1 C)   TempSrc: Oral Oral Oral   SpO2: 93%  95% 94%  Weight:      Height:       Physical Exam General: Well appearing man, NAD. Room air. Heart:RRR; no murmur Lungs: CTAB, no rales Abdomen: abd mildly tender, PD cath out, dry bandages Extremities: no LE edema Dialysis Access: Temp HD line in R internal jugular, RUE AVF +t/b but pulsatile   Additional Objective Labs: Basic Metabolic Panel: Recent Labs  Lab 06/02/24 2125 06/03/24 0605 06/04/24 0714 06/05/24 1228  NA 136  --  137 132*  K 5.0  --  4.9 4.3  CL 102  --  100 93*  CO2 19*  --   --  21*  GLUCOSE 98  --  137* 133*  BUN 80*  --  54* 77*  CREATININE 9.14* 8.79* 7.00* 8.41*  CALCIUM  9.1  --   --  7.5*  PHOS  --   --   --  6.1*   Liver Function Tests: Recent Labs  Lab 06/02/24 2125 06/05/24 1228  AST 65*  --   ALT 71*  --   ALKPHOS 116  --   BILITOT 0.9  --   PROT 7.9  --   ALBUMIN 3.0* 2.3*   Recent Labs  Lab 06/02/24 2125  LIPASE 29   CBC: Recent Labs  Lab 06/02/24 2125 06/03/24 0605 06/04/24 0714 06/05/24 1228  WBC 13.2* 10.5  --  15.5*  HGB 13.0 12.1* 16.0 12.5*  HCT 39.5 36.8* 47.0 37.9*  MCV 92.3 92.2  --  93.1  PLT 201 204  --  184   Blood Culture    Component Value Date/Time   SDES BLOOD SITE  NOT SPECIFIED 06/05/2024 2157   SPECREQUEST  06/05/2024 2157    BOTTLES DRAWN AEROBIC AND ANAEROBIC Blood Culture adequate volume   CULT  06/05/2024 2157    NO GROWTH < 12 HOURS Performed at [facility], [address] [place], [place] [address]    REPTSTATUS PENDING 06/05/2024 2157   Medications:  [START ON 06/07/2024] ampicillin -sulbactam (UNASYN ) IV     dialysis solution 1.5% low-MG/low-CA     doxycycline  (VIBRAMYCIN ) IV 100 mg (06/06/24 1046)    (feeding supplement) PROSource Plus  30 mL Oral BID BM   Chlorhexidine  Gluconate Cloth  6 each Topical Q0600   feeding supplement  237 mL Oral BID BM   folic acid   1 mg Oral Daily   gabapentin   100 mg Oral BID   gentamicin  cream  1 Application Topical Daily   heparin   5,000 Units Subcutaneous Q8H   heparin  sodium (porcine) 1,000 Units in dialysis solution 1.5% low-MG/low-CA 2,000 mL dialysis solution  Peritoneal Dialysis Once   ondansetron  (ZOFRAN ) IV  4 mg Intravenous Once   pantoprazole   40 mg Oral BID   polyethylene glycol  17 g Oral BID   thiamine   100 mg Oral Daily    Dialysis Orders Prev on CCPD   Assessment/Plan: Peritonitis with PD catheter dysfunction: PD fluid with cell count 351, Cx growing MSSA. Severe pain on admit, much improved. In addition to the infection, also was having issues getting PD cath to flush - therefore decision was made to remove PD cath - done on 8/31. Prev on Vanc/Cefepime  -> then Cefazolin  -> now Unasyn  + doxycycline . ESRD: Temp HD line placed on 8/30 and s/p HD that day. PD cath is now out. Plan will be HD temporarily, but hopefully will be able to resume PD in the future. 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 CLIP started today. Dr. Magda with [facility] thinks that likely needs more than f'gram to get AVF functional, likely revision. IR consulted for tunneled HD line - will tentatively be done Thursday AM (assuming fever free), temp line is out. Did well with HD  yesterday, next HD Thursday 9/4 after line placement. HTN/volume: BP variable, no LE edema. UF as tolerated. 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 Secondary HPTH: Ca ok, Phos slightly high. Nutrition: Alb low, continue protein supplements.   Izetta Boehringer, PA-C 06/06/2024, 11:38 AM  [employer]

## 2024-06-06 NOTE — Progress Notes (Signed)
 R temporary internal jugular HDC removed per protocol per MD order. Manual pressure applied for 20 mins. Vaseline gauze, gauze, and Tegaderm applied over insertion site. No bleeding or swelling noted. Instructed patient to remain in bed for thirty mins. Educated patient to keep dressing dry and intact for 24 hours. Pt verbalized comprehension.

## 2024-06-06 NOTE — Progress Notes (Signed)
 [facility] PROGRESS NOTE    Progress Note  Joshua Maldonado  FMW:[id number] DOB: 1955-10-06 DOA: 06/02/2024 PCP: Branda Hair, NP   Brief Narrative:   Joshua Maldonado is an 68 y.o. male past medical history significant for liver cirrhosis, end-stage renal disease on peritoneal dialysis essential hypertension paroxysmal SVT comes in complaining of diffuse abdominal pain which began 7 days prior to admission, accompanied by weakness and fevers.  In the ED was found afebrile, potassium of 5 white blood cell count of 13, and CT scan of the abdomen and pelvis was suspicious for peritonitis with associated peritoneal dialysis catheter and inflammatory changes along the subcutaneous area  Assessment/Plan:   Sepsis most likely due to peritonitis associated with peritoneal dialysis (HCC) Blood cultures were obtained on 06/03/2024 remain negative till date. Vascular surgery was consulted status post PD catheter removal on 06/04/2024. Started empirically on IV vancomycin  and cefepime . Peritoneal fluid grew MSSA.  De-escalated to cefazolin , but developed a temperature and leukocytosis. T.max 101.1, with an increase in his white blood cell count of 15.5 He is passing gas but he started having fever abdominal pain and not tolerating his diet. Will go ahead and place him on IV doxycycline  and IV Augmentin . Will check a CT scan of the abdomen pelvis without contrast to rule out abscess. IR has been consulted and they have delayed temporary dialysis catheter exchange, though probably exchange it in 2 days or so. Surveillance blood cultures redrawn on 06/05/2024.  End-stage renal disease on hemodialysis: Nephrology has been consulted, transition to hemodialysis. For HD today.  Appreciate renal's assistance. IR will delay temporary catheter exchange until he is afebrile for least 2 days. Patient still has urine output  Hyperlipidemia: Agree with holding statins.  Cirrhosis of the  liver: Noted.  History of hepatitis C Noted  DVT prophylaxis: lovenox Family Communication:family Status is: Inpatient Remains inpatient appropriate because: Sepsis    Code Status:     Code Status Orders  (From admission, onward)           Start     Ordered   06/03/24 0540  Full code  Continuous       Question:  By:  Answer:  Consent: discussion documented in EHR   06/03/24 0540           Code Status History     Date Active Date Inactive Code Status Order ID Comments User Context   02/01/2023 0844 02/02/2023 0517 Full Code [id number]  Alona Corners, DO HOV   09/02/2020 2201 09/07/2020 1624 Full Code [id number]  Manfred Driver, DO ED         IV Access:   Peripheral IV   Procedures and diagnostic studies:   No results found.    Medical Consultants:   None.   Subjective:    Joshua Maldonado is to have abdominal pain.  Objective:    Vitals:   06/05/24 2012 06/05/24 2332 06/06/24 0555 06/06/24 0716  BP: (!) 133/97  (!) 125/96 (!) 133/91  Pulse: 94  99 99  Resp: 18  18 18   Temp: (!) 101.1 F (38.4 C) 98.9 F (37.2 C) 98.7 F (37.1 C)   TempSrc: Oral Oral Oral   SpO2: 93%  95% 94%  Weight:      Height:       SpO2: 94 % O2 Flow Rate (L/min): 2 L/min   Intake/Output Summary (Last 24 hours) at 06/06/2024 9192 Last data filed at 06/06/2024 0200 Gross per 24 hour  Intake  670.03 ml  Output 2200 ml  Net -1529.97 ml   Filed Weights   06/03/24 2245 06/05/24 1338 06/05/24 1734  Weight: 82.2 kg 81.3 kg 79.3 kg    Exam: General exam: In no acute distress. Respiratory system: Good air movement and clear to auscultation. Cardiovascular system: S1 & S2 heard, RRR. No JVD. Gastrointestinal system: Abdomen is soft less distended tender to palpation. Extremities: No pedal edema. Skin: No rashes, lesions or ulcers Psychiatry: Judgement and insight appear normal. Mood & affect appropriate.  Data Reviewed:    Labs: Basic Metabolic  Panel: Recent Labs  Lab 06/02/24 2125 06/03/24 0605 06/04/24 0714 06/05/24 1228  NA 136  --  137 132*  K 5.0  --  4.9 4.3  CL 102  --  100 93*  CO2 19*  --   --  21*  GLUCOSE 98  --  137* 133*  BUN 80*  --  54* 77*  CREATININE 9.14* 8.79* 7.00* 8.41*  CALCIUM  9.1  --   --  7.5*  PHOS  --   --   --  6.1*   GFR Estimated Creatinine Clearance: 8.2 mL/min (A) (by C-G formula based on SCr of 8.41 mg/dL (H)). Liver Function Tests: Recent Labs  Lab 06/02/24 2125 06/05/24 1228  AST 65*  --   ALT 71*  --   ALKPHOS 116  --   BILITOT 0.9  --   PROT 7.9  --   ALBUMIN 3.0* 2.3*   Recent Labs  Lab 06/02/24 2125  LIPASE 29   No results for input(s): AMMONIA in the last 168 hours. Coagulation profile No results for input(s): INR, PROTIME in the last 168 hours. COVID-19 Labs  No results for input(s): DDIMER, FERRITIN, LDH, CRP in the last 72 hours.  Lab Results  Component Value Date   SARSCOV2NAA NEGATIVE 09/02/2020   SARSCOV2NAA NEGATIVE 08/08/2020   SARSCOV2NAA NEGATIVE 04/05/2020    CBC: Recent Labs  Lab 06/02/24 2125 06/03/24 0605 06/04/24 0714 06/05/24 1228  WBC 13.2* 10.5  --  15.5*  HGB 13.0 12.1* 16.0 12.5*  HCT 39.5 36.8* 47.0 37.9*  MCV 92.3 92.2  --  93.1  PLT 201 204  --  184   Cardiac Enzymes: No results for input(s): CKTOTAL, CKMB, CKMBINDEX, TROPONINI in the last 168 hours. BNP (last 3 results) No results for input(s): PROBNP in the last 8760 hours. CBG: No results for input(s): GLUCAP in the last 168 hours. D-Dimer: No results for input(s): DDIMER in the last 72 hours. Hgb A1c: No results for input(s): HGBA1C in the last 72 hours. Lipid Profile: No results for input(s): CHOL, HDL, LDLCALC, TRIG, CHOLHDL, LDLDIRECT in the last 72 hours. Thyroid function studies: No results for input(s): TSH, T4TOTAL, T3FREE, THYROIDAB in the last 72 hours.  Invalid input(s): FREET3 Anemia work up: No  results for input(s): VITAMINB12, FOLATE, FERRITIN, TIBC, IRON, RETICCTPCT in the last 72 hours. Sepsis Labs: Recent Labs  Lab 06/02/24 2125 06/03/24 0605 06/05/24 1228  WBC 13.2* 10.5 15.5*   Microbiology Recent Results (from the past 240 hours)  Body fluid culture w Gram Stain     Status: None   Collection Time: 06/03/24  4:43 AM   Specimen: Peritoneal Dialysate; Body Fluid  Result Value Ref Range Status   Specimen Description PERITONEAL DIALYSATE  Final   Special Requests Normal  Final   Gram Stain   Final    RARE WBC PRESENT, PREDOMINANTLY PMN RARE GRAM POSITIVE COCCI IN CLUSTERS IN PAIRS CRITICAL  RESULT CALLED TO, READ BACK BY AND VERIFIED WITH: RN KETURAH MARINA [phone] AT 1438 BY EC Performed at [facility], [address], [place], [place] [address]    Culture MODERATE STAPHYLOCOCCUS AUREUS  Final   Report Status 06/05/2024 FINAL  Final   Organism ID, Bacteria STAPHYLOCOCCUS AUREUS  Final      Susceptibility   Staphylococcus aureus - MIC*    CIPROFLOXACIN <=0.5 SENSITIVE Sensitive     ERYTHROMYCIN 0.5 SENSITIVE Sensitive     GENTAMICIN  <=0.5 SENSITIVE Sensitive     OXACILLIN 0.5 SENSITIVE Sensitive     TETRACYCLINE <=1 SENSITIVE Sensitive     VANCOMYCIN  1 SENSITIVE Sensitive     TRIMETH/SULFA <=10 SENSITIVE Sensitive     CLINDAMYCIN <=0.25 SENSITIVE Sensitive     RIFAMPIN <=0.5 SENSITIVE Sensitive     Inducible Clindamycin NEGATIVE Sensitive     LINEZOLID  2 SENSITIVE Sensitive     * MODERATE STAPHYLOCOCCUS AUREUS  Culture, blood (Routine X 2) w Reflex to ID Panel     Status: None (Preliminary result)   Collection Time: 06/03/24  6:04 AM   Specimen: BLOOD  Result Value Ref Range Status   Specimen Description BLOOD LEFT ANTECUBITAL  Final   Special Requests   Final    BOTTLES DRAWN AEROBIC AND ANAEROBIC Blood Culture results may not be optimal due to an inadequate volume of blood received in culture bottles   Culture   Final    NO GROWTH 3  DAYS Performed at [facility], [address] [place], [place] [address]    Report Status PENDING  Incomplete  Culture, blood (Routine X 2) w Reflex to ID Panel     Status: None (Preliminary result)   Collection Time: 06/03/24  6:04 AM   Specimen: BLOOD LEFT ARM  Result Value Ref Range Status   Specimen Description BLOOD LEFT ARM  Final   Special Requests   Final    BOTTLES DRAWN AEROBIC AND ANAEROBIC Blood Culture adequate volume   Culture   Final    NO GROWTH 3 DAYS Performed at [facility], [address], [place], [place] [address]    Report Status PENDING  Incomplete  Aerobic/Anaerobic Culture w Gram Stain (surgical/deep wound)     Status: None (Preliminary result)   Collection Time: 06/04/24  8:27 AM   Specimen: Catheter Tip; Other  Result Value Ref Range Status   Specimen Description CATH TIP  Final   Special Requests NONE  Final   Gram Stain FEW WBC SEEN NO ORGANISMS SEEN   Final   Culture   Final    FEW STAPHYLOCOCCUS AUREUS SUSCEPTIBILITIES TO FOLLOW Performed at [facility], [address] [place], [place] [address]    Report Status PENDING  Incomplete  Culture, blood (Routine X 2) w Reflex to ID Panel     Status: None (Preliminary result)   Collection Time: 06/05/24  9:50 PM   Specimen: BLOOD  Result Value Ref Range Status   Specimen Description BLOOD SITE NOT SPECIFIED  Final   Special Requests   Final    BOTTLES DRAWN AEROBIC AND ANAEROBIC Blood Culture results may not be optimal due to an inadequate volume of blood received in culture bottles   Culture   Final    NO GROWTH < 12 HOURS Performed at [facility], [address], [place], [place] [address]    Report Status PENDING  Incomplete  Culture, blood (Routine X 2) w  Reflex to ID Panel     Status: None (Preliminary result)   Collection Time: 06/05/24  9:57 PM   Specimen: BLOOD  Result Value Ref Range Status   Specimen Description BLOOD SITE NOT SPECIFIED  Final   Special  Requests   Final    BOTTLES DRAWN AEROBIC AND ANAEROBIC Blood Culture adequate volume   Culture   Final    NO GROWTH < 12 HOURS Performed at [facility], [address] [place], [place] [address]    Report Status PENDING  Incomplete     Medications:    (feeding supplement) PROSource Plus  30 mL Oral BID BM   Chlorhexidine  Gluconate Cloth  6 each Topical Q0600   feeding supplement  237 mL Oral BID BM   folic acid   1 mg Oral Daily   gabapentin   100 mg Oral BID   gentamicin  cream  1 Application Topical Daily   heparin   5,000 Units Subcutaneous Q8H   heparin  sodium (porcine) 1,000 Units in dialysis solution 1.5% low-MG/low-CA 2,000 mL dialysis solution   Peritoneal Dialysis Once   ondansetron  (ZOFRAN ) IV  4 mg Intravenous Once   pantoprazole   40 mg Oral BID   thiamine   100 mg Oral Daily   Continuous Infusions:   ceFAZolin  (ANCEF ) IV 100 mL/hr at 06/05/24 1839   dialysis solution 1.5% low-MG/low-CA        LOS: 3 days   Joshua Maldonado  [facility]  06/06/2024, 8:07 AM

## 2024-06-06 NOTE — Progress Notes (Signed)
 IR Request for tunneled dialysis catheter  68 y.o. male inpatient. History of HTN, HLD, cirrohosis. ESRD on PD. Non functional RUE AVF. ( vascular surgery is being consulted for possible revision). Presented to the ED at [facility] on 8.30.25 with abdominal pain and vomiting.  Found to have peritonitis s/p PD removal on 8.31.25. RIJ temp cath placed by critical care on 8.30.25. WBC 15.5< 10.5< 132. PD catheter tip positive for  staph aureus. PD showed  gram positive cocci on 8.31.25. Blood cultures negative X 2 days. Has a temp cath placed by critical care.   As patient currently has dialysis access and discharge is not imminent recommend [facility] procedure request be placed on hold until patient's WBC trend drown. This was discussed with the Team via EPIC chat. Team is in agreement with the plan of care.

## 2024-06-06 NOTE — Progress Notes (Addendum)
 Requested to see pt to assist with in-center HD arrangements. Met with pt and pt's wife at bedside. Introduced self and explained role. Pt prefers to receive in-center HD at [facility] where pt was receiving PD care prior to admission. Contacted clinic and spoke to clinic FA regarding pt's need for in-center HD at d/c. Clinicals faxed to clinic for review and FA to review schedule and advise navigator of possible in-center options to provide to pt. Clinic advised pt may be stable for d/c after access placed tomorrow per nephrology staff. Will assist as needed.   Randine Mungo Dialysis Navigator [phone]  Addendum at 12:01 pm; Pt can be accepted at [facility] on TTS 10:15 am chair time. Pt will need to arrive at 10:00 am for treatments. Pt can start on Thursday if stable for d/c by then. Clinic reports pt should be able to start on Saturday if needed. Met with pt at bedside. Pt agreeable to above arrangements. Update provided to renal PA as well. Will add HD info to AVS.

## 2024-06-06 NOTE — Progress Notes (Signed)
 06/06/24 1600  Spiritual Encounters  Type of Visit Initial  Care provided to: Pt and family  Referral source Nurse (RN/NT/LPN)  Reason for visit Advance directives  OnCall Visit No    Chaplain responded to consult request for HCPOA. The patient stated that he understood the document. His wife was present in the room.   Chaplain provided the Advance Directive packet as well as education on Advance Directives-documents an individual completes to communicate their health care directions in advance of a time when they may need them. Chaplain informed pt the documents which may be completed here in the hospital are the Living Will and Health Care Power of [place].  Chaplain informed that the Health Care Power of [place] is a legal document in which an individual names another person, their Health Care Agent, to make health care decisions when the individual is not able to make them for themselves. The Health Care Agent's function can be temporary or permanent depending on the pt's ability to make and communicate those decisions independently. Chaplain informed pt in the absence of a Health Care Power of Attorney, the state of [place]  directs health care providers to look to the following individuals in the order listed: legal guardian; an attorney?in?fact under a general power of attorney (POA) if that POA includes the right to make health care decisions; a husband or wife; a majority of parents and adult children; a majority of adult brothers and sisters; or an individual who has an established relationship with you, who is acting in good faith and who can convey your wishes.  If none of these person are available or willing to make medical decisions on a patient's behalf, the law allows the patient's doctor to make decisions for them as long as another doctor agrees with those decisions.  Chaplain also informed the patient that the Health Care agent has no decision-making authority over any  affairs other than those related to his or her medical care.  The chaplain further educated the pt that a Living Will is a legal document that allows an individual to state his or her desire not to receive life-prolonging measures in the event that they have a condition that is incurable and will result in their death in a short period of time; they are unconscious, and doctors are confident that they will not regain consciousness; and/or they have advanced dementia or other substantial and irreversible loss of mental function. The chaplain informed pt that life-prolonging measures are medical treatments that would only serve to postpone death, including breathing machines, kidney dialysis, antibiotics, artificial nutrition and hydration (tube feeding), and similar forms of treatment and that if an individual is able to express their wishes, they may also make them known without the use of a Living Will, but in the event that an individual is not able to express their wishes themselves, a Living Will allows medical providers and the pt's family and friends ensure that they are not making decisions on the pt's behalf, but rather serving as the pt's voice to convey decisions the pt has already made.  The patient is aware that the decision to create an advance directive is theirs alone and they may chose not to complete the documents or may chose to complete one portion or both.  The patient was informed that they can revoke the documents at any time by striking through them and writing void or by completing new documents, but that it is also advisable that the individual verbally  notify interested parties that their wishes have changed.  They are also aware that the document must be signed in the presence of a notary public and two witnesses and that this can be done while the patient is still admitted to the hospital or after discharge in the community. If they decide to complete Advance Directives after being  discharged from the hospital, they have been advised to notify all interested parties and to provide those documents to their physicians and loved ones in addition to bringing them to the hospital in the event of another hospitalization.  The chaplain informed the pt that if they desire to proceed with completing Advance Directive Documentation while they are still admitted, notary services are typically available at [facility] between the hours of 8:30 and 3:30 Monday-Friday.    When the patient is ready to have these documents completed, the patient should request that their nurse place a spiritual care consult and indicate that the patient is ready to have their advance directives notarized so that arrangements for witnesses and notary public can be made.  Please page spiritual care if the patient desires further education or has questions.   M.Kubra Susanna Kerry Resident [phone]

## 2024-06-06 NOTE — TOC Initial Note (Signed)
 Transition of Care [facility]) - Initial/Assessment Note    Patient Details  Name: Joshua Maldonado MRN: [id number] Date of Birth: March 14, 1956  Transition of Care [facility]) CM/SW Contact:    Joshua Maldonado, LCSWA Phone Number: 06/06/2024, 4:06 PM  Clinical Narrative:  Pt is from home with spouse, is independent with ADL's, drives, and works part time.  Pt mentioned that they are not always able to get food when they need it. CSW offered resources to food pantries and the pt stated they are from [name]. Pt does not have a HCPOA but requested information about one, spirirtual consult has been placed.   Pt requested for a walker and a cane as DME, RNCM was notified.   Pt has a plan to return home upon discharge.  CSW will continue to follow.                Expected Discharge Plan: Home/Self Care Barriers to Discharge: Continued Medical Work up   Patient Goals and CMS Choice Patient states their goals for this hospitalization and ongoing recovery are:: Return home w/ DME          Expected Discharge Plan and Services In-house Referral: Clinical Social Work     Living arrangements for the past 2 months: Single Family Home                                      Prior Living Arrangements/Services Living arrangements for the past 2 months: Single Family Home Lives with:: Spouse Patient language and need for interpreter reviewed:: No Do you feel safe going back to the place where you live?: Yes      Need for Family Participation in Patient Care: Yes (Comment) Care giver support system in place?: Yes (comment)   Criminal Activity/Legal Involvement Pertinent to Current Situation/Hospitalization: No - Comment as needed  Activities of Daily Living   ADL Screening (condition at time of admission) Independently performs ADLs?: Yes (appropriate for developmental age) Is the patient deaf or have difficulty hearing?: No Does the patient have difficulty seeing, even when wearing  glasses/contacts?: No Does the patient have difficulty concentrating, remembering, or making decisions?: No  Permission Sought/Granted Permission sought to share information with : Family Supports Permission granted to share information with : Yes, Verbal Permission Granted  Share Information with NAME: Joshua Maldonado     Permission granted to share info w Relationship: Spouse  Permission granted to share info w Contact Information: [phone]  Emotional Assessment Appearance:: Appears older than stated age Attitude/Demeanor/Rapport: Engaged Affect (typically observed): Flat   Alcohol / Substance Use: Not Applicable Psych Involvement: No (comment)  Admission diagnosis:  Peritonitis associated with peritoneal dialysis (HCC) [T85.71XA] Peritonitis associated with peritoneal dialysis, initial encounter [facility]) [T85.71XA] Patient Active Problem List   Diagnosis Date Noted   Peritonitis associated with peritoneal dialysis (HCC) 06/03/2024   ESRD on dialysis (HCC) 06/03/2024   Erectile dysfunction due to arterial insufficiency 12/13/2023   Nicotine abuse 07/27/2022   Leg swelling 07/27/2022   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) 01/15/2021   Severe obstructive sleep apnea-hypopnea syndrome 01/15/2021   Paroxysmal SVT (supraventricular tachycardia) 01/15/2021   Grief reaction 01/15/2021   History of pancreatitis 11/04/2020   GERD (gastroesophageal reflux disease) 11/04/2020   Colon cancer screening 11/04/2020   Acute pancreatitis 09/02/2020   Essential hypertension 09/02/2020   Hyperlipidemia 09/02/2020   Acute kidney injury superimposed  on CKD (HCC) 09/02/2020   Hyperglycemia 09/02/2020   Elevated lipase 09/02/2020   History of hepatitis C 01/31/2020   Cirrhosis of liver without ascites (HCC) 01/31/2020   Dilation of biliary tract 01/31/2020   Abdominal pain 01/31/2020   Constipation 01/31/2020   PCP:  Joshua Hair, NP Pharmacy:    [id number] - [place], [place] - [address] [facility] [address] [place] [address] Phone: [phone] Fax: [phone]  [facility] [address] [place] [address] Phone: [phone] Fax: [phone]     Social Drivers of Health (SDOH) Social History: SDOH Screenings   Food Insecurity: No Food Insecurity (06/03/2024)  Housing: High Risk (06/03/2024)  Transportation Needs: No Transportation Needs (06/03/2024)  Utilities: Not At Risk (06/03/2024)  Social Connections: Moderately Integrated (06/03/2024)  Tobacco Use: High Risk (06/04/2024)   SDOH Interventions:     Readmission Risk Interventions     No data to display

## 2024-06-07 DIAGNOSIS — T8571XA Infection and inflammatory reaction due to peritoneal dialysis catheter, initial encounter: Secondary | ICD-10-CM | POA: Diagnosis not present

## 2024-06-07 LAB — CBC
HCT: 35.5 % — ABNORMAL LOW (ref 39.0–52.0)
Hemoglobin: 11.7 g/dL — ABNORMAL LOW (ref 13.0–17.0)
MCH: 30.1 pg (ref 26.0–34.0)
MCHC: 33 g/dL (ref 30.0–36.0)
MCV: 91.3 fL (ref 80.0–100.0)
Platelets: 187 K/uL (ref 150–400)
RBC: 3.89 MIL/uL — ABNORMAL LOW (ref 4.22–5.81)
RDW: 13.3 % (ref 11.5–15.5)
WBC: 13.2 K/uL — ABNORMAL HIGH (ref 4.0–10.5)
nRBC: 0 % (ref 0.0–0.2)

## 2024-06-07 LAB — HEPATITIS B SURFACE ANTIBODY, QUANTITATIVE: Hep B S AB Quant (Post): 31.3 m[IU]/mL

## 2024-06-07 MED ORDER — POLYETHYLENE GLYCOL 3350 17 G PO PACK
17.0000 g | PACK | Freq: Two times a day (BID) | ORAL | Status: DC | PRN
  Administered 2024-06-07: 17 g via ORAL
  Filled 2024-06-07: qty 1

## 2024-06-07 MED ORDER — CAMPHOR-MENTHOL 0.5-0.5 % EX LOTN
TOPICAL_LOTION | CUTANEOUS | Status: DC | PRN
  Filled 2024-06-07: qty 222

## 2024-06-07 NOTE — Care Management Important Message (Signed)
 Important Message  Patient Details  Name: Joshua Maldonado MRN: [id number] Date of Birth: 05-13-1956   Important Message Given:  Yes - Medicare IM     Claretta Deed 06/07/2024, 7:58 AM

## 2024-06-07 NOTE — Progress Notes (Signed)
 Pt's case discussed with nephrology staff this morning. Pt for [facility] tomorrow and treatment afterwards to make sure access works properly. Contacted [facility] to be advised that pt should start on Saturday. HD info placed on pt's AVS. Will assist as needed.   Randine Mungo Dialysis Navigator [phone]

## 2024-06-07 NOTE — Progress Notes (Signed)
 [facility] Progress Note   Subjective:  Seen in room. No CP/dyspnea.  Abd CT without acute findings. WBC a little better. Explained plan is for [facility] tomorrow, then HD here. He can start outpatient HD on Sat 9/6.  Objective Vitals:   06/06/24 1426 06/06/24 1711 06/07/24 0620 06/07/24 0816  BP: 118/85 (!) 126/90 119/84 (!) 137/104  Pulse: 99 99 99 99  Resp: 18 18 18 18   Temp: 98.6 F (37 C) 98.4 F (36.9 C) 98.1 F (36.7 C) 99.5 F (37.5 C)  TempSrc: Oral Oral Oral Oral  SpO2: 99% 91% 94% 96%  Weight:      Height:       Physical Exam General: Well appearing man, NAD. Room air. Heart:RRR; no murmur Lungs: CTAB, no rales Abdomen: abd distended, mildly tender, PD cath out, dry bandages Extremities: no LE edema Dialysis Access: Temp HD line in R internal jugular, RUE AVF +t/b but pulsatile  Additional Objective Labs: Basic Metabolic Panel: Recent Labs  Lab 06/02/24 2125 06/03/24 0605 06/04/24 0714 06/05/24 1228  NA 136  --  137 132*  K 5.0  --  4.9 4.3  CL 102  --  100 93*  CO2 19*  --   --  21*  GLUCOSE 98  --  137* 133*  BUN 80*  --  54* 77*  CREATININE 9.14* 8.79* 7.00* 8.41*  CALCIUM  9.1  --   --  7.5*  PHOS  --   --   --  6.1*   Liver Function Tests: Recent Labs  Lab 06/02/24 2125 06/05/24 1228  AST 65*  --   ALT 71*  --   ALKPHOS 116  --   BILITOT 0.9  --   PROT 7.9  --   ALBUMIN 3.0* 2.3*   Recent Labs  Lab 06/02/24 2125  LIPASE 29   CBC: Recent Labs  Lab 06/02/24 2125 06/03/24 0605 06/04/24 0714 06/05/24 1228 06/07/24 0423  WBC 13.2* 10.5  --  15.5* 13.2*  HGB 13.0 12.1* 16.0 12.5* 11.7*  HCT 39.5 36.8* 47.0 37.9* 35.5*  MCV 92.3 92.2  --  93.1 91.3  PLT 201 204  --  184 187   Blood Culture    Component Value Date/Time   SDES BLOOD SITE NOT SPECIFIED 06/05/2024 2157   SPECREQUEST  06/05/2024 2157    BOTTLES DRAWN AEROBIC AND ANAEROBIC Blood Culture adequate volume   CULT  06/05/2024 2157    NO GROWTH 2  DAYS Performed at [facility], [address], [place], [place] [address]    REPTSTATUS PENDING 06/05/2024 2157   Studies/Results: CT ABDOMEN PELVIS WO CONTRAST Result Date: 06/06/2024 CLINICAL DATA:  Abdominal pain, acute, nonlocalized EXAM: CT ABDOMEN AND PELVIS WITHOUT CONTRAST TECHNIQUE: Multidetector CT imaging of the abdomen and pelvis was performed following the standard protocol without IV contrast. RADIATION DOSE REDUCTION: This exam was performed according to the departmental dose-optimization program which includes automated exposure control, adjustment of the mA and/or kV according to patient size and/or use of iterative reconstruction technique. COMPARISON:  June 03, 2024 FINDINGS: Of note, the lack of intravenous contrast limits evaluation of the solid organ parenchyma and vascularity. Lower chest: Small right pleural effusion. Subsegmental atelectasis in the lingula. Hepatobiliary: Cirrhotic liver. Unchanged small calcification in the right hepatic lobe. No mass.No radiopaque stones or wall thickening of the gallbladder. No intrahepatic or extrahepatic biliary ductal dilation. Pancreas: No mass or main ductal dilation. No peripancreatic inflammation or fluid collection. Spleen: Normal size.  No mass. Adrenals/Urinary Tract: No adrenal masses. Small subcentimeter hyperdensity in the right interpolar region (axial 31), likely a hemorrhagic cyst. No hydronephrosis or nephrolithiasis. The urinary bladder is completely decompressed. Stomach/Bowel: The stomach is decompressed without focal abnormality. No small bowel wall thickening or inflammation. No small bowel obstruction.No extravasation of enteric contrast to suggest bowel perforation.Normal decompressed appendix, best visualized on the sagittal series. Vascular/Lymphatic: No aortic aneurysm. Scattered aortoiliac atherosclerosis. Similar appearance of mildly enlarged left periaortic lymph nodes measuring up to 1 cm, likely reactive.  Reproductive: No prostatomegaly. No free pelvic fluid. Other: Moderate volume ascites. Diffuse nodularity throughout the omentum and mesentery, likely nodular fluid given the overall progression since June 03, 2024. No pneumoperitoneum. Interval removal of the peritoneal dialysis catheter. Musculoskeletal: No acute fracture or destructive lesion. IMPRESSION: 1. No acute intra-abdominal or pelvic abnormality. 2. Cirrhotic liver again noted. Moderate volume ascites. Diffuse nodularity throughout the omentum and mesentery, likely nodular fluid given the overall progression since June 03, 2024. Interval removal of the peritoneal dialysis catheter. Nonemergent fluid sampling should be considered. 3. Small right pleural effusion. Aortic Atherosclerosis (ICD10-I70.0). Electronically Signed   By: Rogelia Myers M.D.   On: 06/06/2024 19:08   Medications:  ampicillin -sulbactam (UNASYN ) IV 1.5 g (06/07/24 1110)   doxycycline  (VIBRAMYCIN ) IV 100 mg (06/07/24 0837)    (feeding supplement) PROSource Plus  30 mL Oral BID BM   Chlorhexidine  Gluconate Cloth  6 each Topical Q0600   feeding supplement  237 mL Oral BID BM   folic acid   1 mg Oral Daily   gabapentin   100 mg Oral BID   gentamicin  cream  1 Application Topical Daily   heparin   5,000 Units Subcutaneous Q8H   pantoprazole   40 mg Oral BID   polyethylene glycol  17 g Oral BID   thiamine   100 mg Oral Daily    Dialysis Orders Prev on CCPD   Assessment/Plan: Peritonitis with PD catheter dysfunction: PD fluid with cell count 351, Cx growing MSSA. Severe pain on admit, much improved. In addition to the infection, also was having issues getting PD cath to flush - therefore decision was made to remove PD cath - done on 8/31. Prev on Vanc/Cefepime  -> then Cefazolin  -> now Unasyn  + doxycycline . ESRD: Temp HD line placed on 8/30 and s/p HD that day. PD cath is now out. Plan will be HD temporarily, but hopefully will be able to resume PD in the future. He is a  [employer], so cannot work and do iHD, unclear if HHD is an option. Dr. Magda with [facility] thinks that likely needs more than f'gram to get AVF functional, likely revision. IR consulted for tunneled HD line - will tentatively be done Thursday AM. Has been CLIPd to outpatient HD. For HD tomorrow after line and will start outpatient HD on Saturday 9/6. HTN/volume: BP variable, no LE edema but with ascites. UF as tolerated. 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 Secondary HPTH: Ca ok, Phos slightly high. Nutrition: Alb low, continue protein supplements.   Izetta Boehringer, PA-C 06/07/2024, 11:32 AM  [employer]

## 2024-06-07 NOTE — Progress Notes (Signed)
 PROGRESS NOTE  Joshua Maldonado FMW:[id number] DOB: 11-11-1955   PCP: Branda Hair, NP  Patient is from: Home.  Lives with wife.  Independently ambulates at baseline.  DOA: 06/02/2024 LOS: 4  Chief complaints Chief Complaint  Patient presents with   Abdominal Pain     Brief Narrative / Interim history: 68 year old M with PMH of liver cirrhosis, ESRD on PD, HTN and paroxysmal SVT presenting with abdominal pain, weakness and fever and admitted with peritonitis associated with peritoneal dialysis.  Patient has had diffuse abdominal pain which began 7 days prior to admission, accompanied by weakness and fevers.    In the ED was found afebrile.  WBC 13.  K 5.  CT scan of the abdomen and pelvis was suspicious for peritonitis with associated peritoneal dialysis catheter and inflammatory changes along the subcutaneous area   Vascular surgery consulted. Peritoneal fluid culture with MSSA.  PD catheter removed on 8/31.  Temporary HD cath placed started IHD.  HD cath to be tunneled.  CLIP in process.   Subjective: Seen and examined earlier this morning.  No major events overnight or this morning.  Reports feeling better.  He says he has not a bowel movement in 4 days.  Feels distended.  Patient passing gas.  Denies nausea or vomiting.  Patient's wife at bedside.  Objective: Vitals:   06/06/24 1426 06/06/24 1711 06/07/24 0620 06/07/24 0816  BP: 118/85 (!) 126/90 119/84 (!) 137/104  Pulse: 99 99 99 99  Resp: 18 18 18 18   Temp: 98.6 F (37 C) 98.4 F (36.9 C) 98.1 F (36.7 C) 99.5 F (37.5 C)  TempSrc: Oral Oral Oral Oral  SpO2: 99% 91% 94% 96%  Weight:      Height:        Examination:  GENERAL: No apparent distress.  Nontoxic. HEENT: MMM.  Vision and hearing grossly intact.  NECK: Supple.  No apparent JVD.  RESP:  No IWOB.  Fair aeration bilaterally. CVS:  RRR. Heart sounds normal.  ABD/GI/GU: BS+. Abd distended and tender.  No rebound. MSK/EXT:  Moves extremities. No  apparent deformity. No edema.  SKIN: no apparent skin lesion or wound NEURO: AA.  Oriented appropriately.  No apparent focal neuro deficit. PSYCH: Calm. Normal affect.   Consultants:  Vascular surgery Nephrology Pulmonology-placed temporary HD cath.  Procedures: 8/30-temporary HD cath placed by PCCM 8/31-PD catheter removed  Microbiology summarized: 8/30-peritoneal fluid culture with MSSA. 8/30-blood cultures NGTD 9/1-blood cultures NGTD  Assessment and plan: Sepsis most likely due to peritonitis associated with peritoneal dialysis [facility]): Present on arrival.  Heart fever and leukocytosis on presentation.  Culture data as above.  PD cath removed. -Now on intermittent HD.  CLIP in process -De-escalated to cefazolin , but developed a temperature and leukocytosis>> doxycycline  and Augmentin  -Repeat CT on 9/2 without acute intra-abdominal or pelvic abnormality but cirrhotic liver with moderate ascites. [facility] before discharge.   End-stage renal disease on hemodialysis: -Now on IHD per nephrology -IR will delay temporary catheter exchange until he is afebrile for least 2 days.  Abdominal distention/pain/tenderness: Reports no BM in 4 days.  Passing gas.  No nausea or vomiting. - Encouraged ambulating in the hallway and getting out of the bed. - Ambulate every 4 hours  Hyperlipidemia: -Agree with holding statins.   Cirrhosis of the liver: Noted on CT. -Outpatient follow-up   History of hepatitis C: Noted.    Body mass index is 27.38 kg/m.          DVT prophylaxis:  heparin   injection 5,000 Units Start: 06/03/24 0600  Code Status: Full code Family Communication: Updated patient's wife at bedside Level of care: Telemetry Cardiac Status is: Inpatient Remains inpatient appropriate because: Peritonitis   Final disposition: Home   55 minutes with more than 50% spent in reviewing records, counseling patient/family and coordinating care.   Sch Meds:  Scheduled Meds:   (feeding supplement) PROSource Plus  30 mL Oral BID BM   Chlorhexidine  Gluconate Cloth  6 each Topical Q0600   feeding supplement  237 mL Oral BID BM   folic acid   1 mg Oral Daily   gabapentin   100 mg Oral BID   gentamicin  cream  1 Application Topical Daily   heparin   5,000 Units Subcutaneous Q8H   pantoprazole   40 mg Oral BID   polyethylene glycol  17 g Oral BID   thiamine   100 mg Oral Daily   Continuous Infusions:  ampicillin -sulbactam (UNASYN ) IV 1.5 g (06/07/24 1110)   doxycycline  (VIBRAMYCIN ) IV 100 mg (06/07/24 0837)   PRN Meds:.camphor-menthol , HYDROmorphone  (DILAUDID ) injection, sodium chloride  flush  Antimicrobials: Anti-infectives (From admission, onward)    Start     Dose/Rate Route Frequency Ordered Stop   06/07/24 1000  ampicillin -sulbactam (UNASYN ) 1.5 g in sodium chloride  0.9 % 100 mL IVPB        1.5 g 200 mL/hr over 30 Minutes Intravenous Every 24 hours 06/06/24 1118     06/06/24 1000  linezolid  (ZYVOX ) tablet 600 mg  Status:  Discontinued        600 mg Oral Every 12 hours 06/06/24 0809 06/06/24 0813   06/06/24 1000  doxycycline  (VIBRA -TABS) tablet 100 mg  Status:  Discontinued        100 mg Oral Every 12 hours 06/06/24 0821 06/06/24 0823   06/06/24 1000  ampicillin -sulbactam (UNASYN ) 1.5 g in sodium chloride  0.9 % 100 mL IVPB  Status:  Discontinued        1.5 g 200 mL/hr over 30 Minutes Intravenous Every 12 hours 06/06/24 0821 06/06/24 1118   06/06/24 1000  doxycycline  (VIBRAMYCIN ) 100 mg in sodium chloride  0.9 % 250 mL IVPB        100 mg 125 mL/hr over 120 Minutes Intravenous Every 12 hours 06/06/24 0823     06/06/24 0900  amoxicillin -clavulanate (AUGMENTIN ) 500-125 MG per tablet 1 tablet  Status:  Discontinued        1 tablet Oral Every 8 hours 06/06/24 0813 06/06/24 0813   06/06/24 0900  amoxicillin -clavulanate (AUGMENTIN ) 500-125 MG per tablet 1 tablet  Status:  Discontinued        1 tablet Oral Daily 06/06/24 0813 06/06/24 0821   06/05/24 1800  ceFAZolin   (ANCEF ) IVPB 1 g/50 mL premix  Status:  Discontinued        1 g 100 mL/hr over 30 Minutes Intravenous Every 24 hours 06/05/24 1528 06/06/24 0809   06/05/24 1200  vancomycin  (VANCOCIN ) IVPB 1000 mg/200 mL premix  Status:  Discontinued        1,000 mg 200 mL/hr over 60 Minutes Intravenous  Once 06/04/24 1212 06/05/24 1528   06/03/24 0800  ceFEPIme  (MAXIPIME ) 1 g in sodium chloride  0.9 % 100 mL IVPB  Status:  Discontinued        1 g 200 mL/hr over 30 Minutes Intravenous Every 24 hours 06/03/24 0541 06/05/24 1528   06/03/24 0615  vancomycin  (VANCOREADY) IVPB 2000 mg/400 mL        2,000 mg 200 mL/hr over 120 Minutes Intravenous  Once 06/03/24 9388 06/03/24  1020        I have personally reviewed the following labs and images: CBC: Recent Labs  Lab 06/02/24 2125 06/03/24 0605 06/04/24 0714 06/05/24 1228 06/07/24 0423  WBC 13.2* 10.5  --  15.5* 13.2*  HGB 13.0 12.1* 16.0 12.5* 11.7*  HCT 39.5 36.8* 47.0 37.9* 35.5*  MCV 92.3 92.2  --  93.1 91.3  PLT 201 204  --  184 187   BMP &GFR Recent Labs  Lab 06/02/24 2125 06/03/24 0605 06/04/24 0714 06/05/24 1228  NA 136  --  137 132*  K 5.0  --  4.9 4.3  CL 102  --  100 93*  CO2 19*  --   --  21*  GLUCOSE 98  --  137* 133*  BUN 80*  --  54* 77*  CREATININE 9.14* 8.79* 7.00* 8.41*  CALCIUM  9.1  --   --  7.5*  PHOS  --   --   --  6.1*   Estimated Creatinine Clearance: 8.2 mL/min (A) (by C-G formula based on SCr of 8.41 mg/dL (H)). Liver & Pancreas: Recent Labs  Lab 06/02/24 2125 06/05/24 1228  AST 65*  --   ALT 71*  --   ALKPHOS 116  --   BILITOT 0.9  --   PROT 7.9  --   ALBUMIN 3.0* 2.3*   Recent Labs  Lab 06/02/24 2125  LIPASE 29   No results for input(s): AMMONIA in the last 168 hours. Diabetic: No results for input(s): HGBA1C in the last 72 hours. No results for input(s): GLUCAP in the last 168 hours. Cardiac Enzymes: No results for input(s): CKTOTAL, CKMB, CKMBINDEX, TROPONINI in the last 168  hours. No results for input(s): PROBNP in the last 8760 hours. Coagulation Profile: No results for input(s): INR, PROTIME in the last 168 hours. Thyroid Function Tests: No results for input(s): TSH, T4TOTAL, FREET4, T3FREE, THYROIDAB in the last 72 hours. Lipid Profile: No results for input(s): CHOL, HDL, LDLCALC, TRIG, CHOLHDL, LDLDIRECT in the last 72 hours. Anemia Panel: No results for input(s): VITAMINB12, FOLATE, FERRITIN, TIBC, IRON, RETICCTPCT in the last 72 hours. Urine analysis:    Component Value Date/Time   COLORURINE YELLOW 06/02/2024 2222   APPEARANCEUR CLEAR 06/02/2024 2222   LABSPEC 1.013 06/02/2024 2222   PHURINE 6.0 06/02/2024 2222   GLUCOSEU NEGATIVE 06/02/2024 2222   HGBUR NEGATIVE 06/02/2024 2222   BILIRUBINUR NEGATIVE 06/02/2024 2222   KETONESUR NEGATIVE 06/02/2024 2222   PROTEINUR >=300 (A) 06/02/2024 2222   NITRITE NEGATIVE 06/02/2024 2222   LEUKOCYTESUR NEGATIVE 06/02/2024 2222   Sepsis Labs: Invalid input(s): PROCALCITONIN, LACTICIDVEN  Microbiology: Recent Results (from the past 240 hours)  Body fluid culture w Gram Stain     Status: None   Collection Time: 06/03/24  4:43 AM   Specimen: Peritoneal Dialysate; Body Fluid  Result Value Ref Range Status   Specimen Description PERITONEAL DIALYSATE  Final   Special Requests Normal  Final   Gram Stain   Final    RARE WBC PRESENT, PREDOMINANTLY PMN RARE GRAM POSITIVE COCCI IN CLUSTERS IN PAIRS CRITICAL RESULT CALLED TO, READ BACK BY AND VERIFIED WITH: RN KETURAH MARINA [phone] AT 1438 BY EC Performed at [facility], [address], [place], [place] [address]    Culture MODERATE STAPHYLOCOCCUS AUREUS  Final   Report Status 06/05/2024 FINAL  Final   Organism ID, Bacteria STAPHYLOCOCCUS AUREUS  Final      Susceptibility   Staphylococcus aureus - MIC*  CIPROFLOXACIN <=0.5 SENSITIVE Sensitive     ERYTHROMYCIN 0.5 SENSITIVE Sensitive     GENTAMICIN   <=0.5 SENSITIVE Sensitive     OXACILLIN 0.5 SENSITIVE Sensitive     TETRACYCLINE <=1 SENSITIVE Sensitive     VANCOMYCIN  1 SENSITIVE Sensitive     TRIMETH/SULFA <=10 SENSITIVE Sensitive     CLINDAMYCIN <=0.25 SENSITIVE Sensitive     RIFAMPIN <=0.5 SENSITIVE Sensitive     Inducible Clindamycin NEGATIVE Sensitive     LINEZOLID  2 SENSITIVE Sensitive     * MODERATE STAPHYLOCOCCUS AUREUS  Culture, blood (Routine X 2) w Reflex to ID Panel     Status: None (Preliminary result)   Collection Time: 06/03/24  6:04 AM   Specimen: BLOOD  Result Value Ref Range Status   Specimen Description BLOOD LEFT ANTECUBITAL  Final   Special Requests   Final    BOTTLES DRAWN AEROBIC AND ANAEROBIC Blood Culture results may not be optimal due to an inadequate volume of blood received in culture bottles   Culture   Final    NO GROWTH 4 DAYS Performed at [facility], [address], [place], [place] [address]    Report Status PENDING  Incomplete  Culture, blood (Routine X 2) w Reflex to ID Panel     Status: None (Preliminary result)   Collection Time: 06/03/24  6:04 AM   Specimen: BLOOD LEFT ARM  Result Value Ref Range Status   Specimen Description BLOOD LEFT ARM  Final   Special Requests   Final    BOTTLES DRAWN AEROBIC AND ANAEROBIC Blood Culture adequate volume   Culture   Final    NO GROWTH 4 DAYS Performed at [facility], [address] [place], [place] [address]    Report Status PENDING  Incomplete  Aerobic/Anaerobic Culture w Gram Stain (surgical/deep wound)     Status: None (Preliminary result)   Collection Time: 06/04/24  8:27 AM   Specimen: Catheter Tip; Other  Result Value Ref Range Status   Specimen Description CATH TIP  Final   Special Requests NONE  Final   Gram Stain   Final    FEW WBC SEEN NO ORGANISMS SEEN Performed at [facility], [address], [place], [place] [address]    Culture   Final    FEW STAPHYLOCOCCUS AUREUS NO ANAEROBES ISOLATED; CULTURE IN  PROGRESS FOR 5 DAYS    Report Status PENDING  Incomplete   Organism ID, Bacteria STAPHYLOCOCCUS AUREUS  Final      Susceptibility   Staphylococcus aureus - MIC*    CIPROFLOXACIN <=0.5 SENSITIVE Sensitive     ERYTHROMYCIN <=0.25 SENSITIVE Sensitive     GENTAMICIN  <=0.5 SENSITIVE Sensitive     OXACILLIN 0.5 SENSITIVE Sensitive     TETRACYCLINE <=1 SENSITIVE Sensitive     VANCOMYCIN  1 SENSITIVE Sensitive     TRIMETH/SULFA <=10 SENSITIVE Sensitive     CLINDAMYCIN <=0.25 SENSITIVE Sensitive     RIFAMPIN <=0.5 SENSITIVE Sensitive     Inducible Clindamycin NEGATIVE Sensitive     LINEZOLID  2 SENSITIVE Sensitive     * FEW STAPHYLOCOCCUS AUREUS  Culture, blood (Routine X 2) w Reflex to ID Panel     Status: None (Preliminary result)   Collection Time: 06/05/24  9:50 PM   Specimen: BLOOD  Result Value Ref Range Status   Specimen Description BLOOD SITE NOT SPECIFIED  Final   Special Requests   Final    BOTTLES DRAWN AEROBIC AND ANAEROBIC Blood Culture results may  not be optimal due to an inadequate volume of blood received in culture bottles   Culture   Final    NO GROWTH 2 DAYS Performed at [facility], [address], [place], [place] [address]    Report Status PENDING  Incomplete  Culture, blood (Routine X 2) w Reflex to ID Panel     Status: None (Preliminary result)   Collection Time: 06/05/24  9:57 PM   Specimen: BLOOD  Result Value Ref Range Status   Specimen Description BLOOD SITE NOT SPECIFIED  Final   Special Requests   Final    BOTTLES DRAWN AEROBIC AND ANAEROBIC Blood Culture adequate volume   Culture   Final    NO GROWTH 2 DAYS Performed at [facility] Lab, [address], [place], [place] [address]    Report Status PENDING  Incomplete    Radiology Studies: No results found.    Analisse Randle T. Malique Driskill [facility]  If 7PM-7AM, please contact night-coverage [email or web address] 06/07/2024, 4:06 PM

## 2024-06-08 ENCOUNTER — Inpatient Hospital Stay (HOSPITAL_COMMUNITY)

## 2024-06-08 DIAGNOSIS — T8571XA Infection and inflammatory reaction due to peritoneal dialysis catheter, initial encounter: Secondary | ICD-10-CM | POA: Diagnosis not present

## 2024-06-08 HISTORY — PX: IR TUNNELED CENTRAL VENOUS CATH PLC W IMG: IMG1939

## 2024-06-08 LAB — MAGNESIUM: Magnesium: 2 mg/dL (ref 1.7–2.4)

## 2024-06-08 LAB — RENAL FUNCTION PANEL
Albumin: 2.1 g/dL — ABNORMAL LOW (ref 3.5–5.0)
Anion gap: 17 — ABNORMAL HIGH (ref 5–15)
BUN: 72 mg/dL — ABNORMAL HIGH (ref 8–23)
CO2: 21 mmol/L — ABNORMAL LOW (ref 22–32)
Calcium: 7.5 mg/dL — ABNORMAL LOW (ref 8.9–10.3)
Chloride: 98 mmol/L (ref 98–111)
Creatinine, Ser: 8.56 mg/dL — ABNORMAL HIGH (ref 0.61–1.24)
GFR, Estimated: 6 mL/min — ABNORMAL LOW (ref >60)
Glucose, Bld: 112 mg/dL — ABNORMAL HIGH (ref 70–99)
Phosphorus: 6.1 mg/dL — ABNORMAL HIGH (ref 2.5–4.6)
Potassium: 4.8 mmol/L (ref 3.5–5.1)
Sodium: 136 mmol/L (ref 135–145)

## 2024-06-08 LAB — CBC
HCT: 35 % — ABNORMAL LOW (ref 39.0–52.0)
Hemoglobin: 11.7 g/dL — ABNORMAL LOW (ref 13.0–17.0)
MCH: 30.2 pg (ref 26.0–34.0)
MCHC: 33.4 g/dL (ref 30.0–36.0)
MCV: 90.2 fL (ref 80.0–100.0)
Platelets: 184 K/uL (ref 150–400)
RBC: 3.88 MIL/uL — ABNORMAL LOW (ref 4.22–5.81)
RDW: 13.4 % (ref 11.5–15.5)
WBC: 14 K/uL — ABNORMAL HIGH (ref 4.0–10.5)
nRBC: 0 % (ref 0.0–0.2)

## 2024-06-08 LAB — CULTURE, BLOOD (ROUTINE X 2)
Culture: NO GROWTH
Culture: NO GROWTH
Special Requests: ADEQUATE

## 2024-06-08 MED ORDER — HEPARIN SODIUM (PORCINE) 1000 UNIT/ML IJ SOLN
4000.0000 [IU] | Freq: Once | INTRAMUSCULAR | Status: AC
  Administered 2024-06-08: 3.2 mL via INTRAVENOUS

## 2024-06-08 MED ORDER — MIDAZOLAM HCL 2 MG/2ML IJ SOLN
INTRAMUSCULAR | Status: AC
  Filled 2024-06-08: qty 2

## 2024-06-08 MED ORDER — LIDOCAINE-EPINEPHRINE 1 %-1:100000 IJ SOLN
20.0000 mL | Freq: Once | INTRAMUSCULAR | Status: AC
  Administered 2024-06-08: 20 mL

## 2024-06-08 MED ORDER — SEVELAMER CARBONATE 800 MG PO TABS
800.0000 mg | ORAL_TABLET | Freq: Three times a day (TID) | ORAL | Status: DC
  Administered 2024-06-08 – 2024-06-09 (×2): 800 mg via ORAL
  Filled 2024-06-08 (×2): qty 1

## 2024-06-08 MED ORDER — LIDOCAINE-EPINEPHRINE 1 %-1:100000 IJ SOLN
INTRAMUSCULAR | Status: AC
Start: 2024-06-08 — End: 2024-06-08
  Filled 2024-06-08: qty 1

## 2024-06-08 MED ORDER — SENNOSIDES-DOCUSATE SODIUM 8.6-50 MG PO TABS
1.0000 | ORAL_TABLET | Freq: Two times a day (BID) | ORAL | Status: DC
  Administered 2024-06-08: 1 via ORAL
  Filled 2024-06-08: qty 1

## 2024-06-08 MED ORDER — FENTANYL CITRATE (PF) 100 MCG/2ML IJ SOLN
INTRAMUSCULAR | Status: AC | PRN
  Administered 2024-06-08: 25 ug via INTRAVENOUS

## 2024-06-08 MED ORDER — HEPARIN SODIUM (PORCINE) 1000 UNIT/ML IJ SOLN
INTRAMUSCULAR | Status: AC
  Filled 2024-06-08: qty 4

## 2024-06-08 MED ORDER — POLYETHYLENE GLYCOL 3350 17 G PO PACK
17.0000 g | PACK | Freq: Two times a day (BID) | ORAL | Status: DC
  Administered 2024-06-08 – 2024-06-09 (×2): 17 g via ORAL
  Filled 2024-06-08 (×2): qty 1

## 2024-06-08 MED ORDER — HEPARIN SODIUM (PORCINE) 1000 UNIT/ML IJ SOLN
INTRAMUSCULAR | Status: AC
  Filled 2024-06-08: qty 10

## 2024-06-08 MED ORDER — SENNOSIDES-DOCUSATE SODIUM 8.6-50 MG PO TABS
2.0000 | ORAL_TABLET | Freq: Two times a day (BID) | ORAL | Status: DC
  Administered 2024-06-08 – 2024-06-09 (×2): 2 via ORAL
  Filled 2024-06-08 (×3): qty 2

## 2024-06-08 MED ORDER — FENTANYL CITRATE (PF) 100 MCG/2ML IJ SOLN
INTRAMUSCULAR | Status: AC
Start: 2024-06-08 — End: 2024-06-08
  Filled 2024-06-08: qty 2

## 2024-06-08 MED ORDER — MIDAZOLAM HCL 2 MG/2ML IJ SOLN
INTRAMUSCULAR | Status: AC | PRN
  Administered 2024-06-08: .5 mg via INTRAVENOUS

## 2024-06-08 NOTE — Procedures (Signed)
 Vascular and Interventional Radiology Procedure Note  Patient: HILERY WINTLE DOB: [birth year] Medical Record Number: [id number] Note Date/Time: 06/08/24 1:39 PM   Performing Physician: Thom Hall, MD Assistant(s): None  Diagnosis: ESRD requiring Hemodialysis  Procedure: TUNNELED HEMODIALYSIS CATHETER PLACEMENT  Anesthesia: Conscious Sedation Complications: None Estimated Blood Loss: Minimal Specimens:  None  Findings:  Successful placement of right-sided, 19 cm (tip-to-cuff), tunneled hemodialysis catheter with the tip of the catheter in the proximal right atrium.  Plan: Catheter ready for use.  See detailed procedure note with images in PACS. The patient tolerated the procedure well without incident or complication and was returned to Recovery in stable condition.    Thom Hall, MD [facility] Radiology   Pager. [phone] Clinic. [phone]

## 2024-06-08 NOTE — Progress Notes (Signed)
 Patient's IV infiltrated, doxycylcine infusing. Infusion stopped, pharmacy notified, advised to place ice pack and remove IV. New IV placed.

## 2024-06-08 NOTE — Progress Notes (Signed)
  [facility] Progress Note   Subjective:   Seen in room. No CP/dyspnea. Still a little abd pain, soft on exam. Plan remains for [facility] then HD today. Told RN he is having itching - will start Phos binder and can give benadryl after his procedure.  Objective Vitals:   06/08/24 0544 06/08/24 0549 06/08/24 0645 06/08/24 0740  BP: (!) 155/104 120/78 (!) 152/98 (!) 141/102  Pulse: 87 (!) 113 92 88  Resp: 19   18  Temp: 98 F (36.7 C) 98 F (36.7 C)    TempSrc: Axillary Oral    SpO2: 100% 91% 100% 100%  Weight:      Height:       Physical Exam General: Well appearing man, NAD. Room air. Heart:RRR; no murmur Lungs: CTAB, no rales Abdomen: abd distended, soft, no guarding, bandages in place/dry Extremities: no LE edema Dialysis Access: RUE AVF +t/b but pulsatile  Additional Objective Labs: Basic Metabolic Panel: Recent Labs  Lab 06/02/24 2125 06/03/24 0605 06/04/24 0714 06/05/24 1228 06/08/24 0416  NA 136  --  137 132* 136  K 5.0  --  4.9 4.3 4.8  CL 102  --  100 93* 98  CO2 19*  --   --  21* 21*  GLUCOSE 98  --  137* 133* 112*  BUN 80*  --  54* 77* 72*  CREATININE 9.14*   < > 7.00* 8.41* 8.56*  CALCIUM  9.1  --   --  7.5* 7.5*  PHOS  --   --   --  6.1* 6.1*   < > = values in this interval not displayed.   Liver Function Tests: Recent Labs  Lab 06/02/24 2125 06/05/24 1228 06/08/24 0416  AST 65*  --   --   ALT 71*  --   --   ALKPHOS 116  --   --   BILITOT 0.9  --   --   PROT 7.9  --   --   ALBUMIN 3.0* 2.3* 2.1*   Recent Labs  Lab 06/02/24 2125  LIPASE 29   CBC: Recent Labs  Lab 06/02/24 2125 06/03/24 0605 06/04/24 0714 06/05/24 1228 06/07/24 0423 06/08/24 0416  WBC 13.2* 10.5  --  15.5* 13.2* 14.0*  HGB 13.0 12.1*   < > 12.5* 11.7* 11.7*  HCT 39.5 36.8*   < > 37.9* 35.5* 35.0*  MCV 92.3 92.2  --  93.1 91.3 90.2  PLT 201 204  --  184 187 184   < > = values in this interval not displayed.   Blood Culture    Component Value  Date/Time   SDES BLOOD SITE NOT SPECIFIED 06/05/2024 2157   SPECREQUEST  06/05/2024 2157    BOTTLES DRAWN AEROBIC AND ANAEROBIC Blood Culture adequate volume   CULT  06/05/2024 2157    NO GROWTH 3 DAYS Performed at [facility] Lab, [address], [place], [place] [address]    REPTSTATUS PENDING 06/05/2024 2157   Studies/Results: CT ABDOMEN PELVIS WO CONTRAST Result Date: 06/06/2024 CLINICAL DATA:  Abdominal pain, acute, nonlocalized EXAM: CT ABDOMEN AND PELVIS WITHOUT CONTRAST TECHNIQUE: Multidetector CT imaging of the abdomen and pelvis was performed following the standard protocol without IV contrast. RADIATION DOSE REDUCTION: This exam was performed according to the departmental dose-optimization program which includes automated exposure control, adjustment of the mA and/or kV according to patient size and/or use of iterative reconstruction technique. COMPARISON:  June 03, 2024 FINDINGS: Of note, the lack of intravenous contrast limits evaluation  of the solid organ parenchyma and vascularity. Lower chest: Small right pleural effusion. Subsegmental atelectasis in the lingula. Hepatobiliary: Cirrhotic liver. Unchanged small calcification in the right hepatic lobe. No mass.No radiopaque stones or wall thickening of the gallbladder. No intrahepatic or extrahepatic biliary ductal dilation. Pancreas: No mass or main ductal dilation. No peripancreatic inflammation or fluid collection. Spleen: Normal size. No mass. Adrenals/Urinary Tract: No adrenal masses. Small subcentimeter hyperdensity in the right interpolar region (axial 31), likely a hemorrhagic cyst. No hydronephrosis or nephrolithiasis. The urinary bladder is completely decompressed. Stomach/Bowel: The stomach is decompressed without focal abnormality. No small bowel wall thickening or inflammation. No small bowel obstruction.No extravasation of enteric contrast to suggest bowel perforation.Normal decompressed appendix, best visualized on the  sagittal series. Vascular/Lymphatic: No aortic aneurysm. Scattered aortoiliac atherosclerosis. Similar appearance of mildly enlarged left periaortic lymph nodes measuring up to 1 cm, likely reactive. Reproductive: No prostatomegaly. No free pelvic fluid. Other: Moderate volume ascites. Diffuse nodularity throughout the omentum and mesentery, likely nodular fluid given the overall progression since June 03, 2024. No pneumoperitoneum. Interval removal of the peritoneal dialysis catheter. Musculoskeletal: No acute fracture or destructive lesion. IMPRESSION: 1. No acute intra-abdominal or pelvic abnormality. 2. Cirrhotic liver again noted. Moderate volume ascites. Diffuse nodularity throughout the omentum and mesentery, likely nodular fluid given the overall progression since June 03, 2024. Interval removal of the peritoneal dialysis catheter. Nonemergent fluid sampling should be considered. 3. Small right pleural effusion. Aortic Atherosclerosis (ICD10-I70.0). Electronically Signed   By: Rogelia Myers M.D.   On: 06/06/2024 19:08   Medications:  ampicillin -sulbactam (UNASYN ) IV 1.5 g (06/08/24 1112)   doxycycline  (VIBRAMYCIN ) IV 100 mg (06/08/24 1224)    (feeding supplement) PROSource Plus  30 mL Oral BID BM   Chlorhexidine  Gluconate Cloth  6 each Topical Q0600   feeding supplement  237 mL Oral BID BM   folic acid   1 mg Oral Daily   gabapentin   100 mg Oral BID   gentamicin  cream  1 Application Topical Daily   heparin   5,000 Units Subcutaneous Q8H   pantoprazole   40 mg Oral BID   polyethylene glycol  17 g Oral BID   thiamine   100 mg Oral Daily    Dialysis Orders Prev on CCPD   Assessment/Plan: Peritonitis with PD catheter dysfunction: PD fluid with cell count 351, Cx growing MSSA. Severe pain on admit, much improved. In addition to the infection, also was having issues getting PD cath to flush - therefore decision was made to remove PD cath - done on 8/31. Blood Cx negative. Prev on  Vanc/Cefepime  -> then Cefazolin  -> now Unasyn  + doxycycline . ESRD: Temp HD line placed on 8/30 and s/p HD that day. PD cath is now out. Plan will be HD temporarily, but hopefully will be able to resume PD in the future. 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 Dr. Magda with [facility] thinks that likely needs more than f'gram to get AVF functional, likely revision. IR consulted for tunneled HD line - will be done today, followed by HD. Has been CLIPd to outpatient HD - can start there Saturday 9/6. HTN/volume: BP variable, no LE edema but with ascites. UF as tolerated. 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  Secondary HPTH: Ca ok, Phos slightly high. Will start phos binder.  Nutrition: Alb low, continue protein supplements. Cirrhosis: Per CT, follow.   Izetta Boehringer, PA-C 06/08/2024, 12:46 PM  [facility]

## 2024-06-08 NOTE — Progress Notes (Signed)
 PROGRESS NOTE  Joshua Maldonado FMW:[id number] DOB: May 29, 1956   PCP: Branda Hair, NP  Patient is from: Home.  Lives with wife.  Independently ambulates at baseline.  DOA: 06/02/2024 LOS: 5  Chief complaints Chief Complaint  Patient presents with   Abdominal Pain     Brief Narrative / Interim history: 68 year old M with PMH of liver cirrhosis, ESRD on PD, HTN and paroxysmal SVT presenting with abdominal pain, weakness and fever and admitted with peritonitis associated with peritoneal dialysis.  Patient has had diffuse abdominal pain which began 7 days prior to admission, accompanied by weakness and fevers.    In the ED was found afebrile.  WBC 13.  K 5.  CT scan of the abdomen and pelvis was suspicious for peritonitis with associated peritoneal dialysis catheter and inflammatory changes along the subcutaneous area   Vascular surgery consulted. Peritoneal fluid culture with MSSA.  PD catheter removed on 8/31.  Temporary HD cath placed started IHD.  HD cath to be tunneled.  Has outpatient HD chair.  Subjective: Seen and examined earlier this morning.  No major events overnight or this morning.  Reports rough night due to not getting his pain medication until earlier this morning.  Pain improved after he received pain medications this morning.  Passing gas but no bowel movements.  Walked once in the hallway yesterday.  Denies nausea or vomiting.  Objective: Vitals:   06/08/24 0549 06/08/24 0645 06/08/24 0740 06/08/24 1405  BP: 120/78 (!) 152/98 (!) 141/102 (!) 160/93  Pulse: (!) 113 92 88 82  Resp:   18 20  Temp: 98 F (36.7 C)     TempSrc: Oral     SpO2: 91% 100% 100% 94%  Weight:      Height:        Examination:  GENERAL: No apparent distress.  Nontoxic. HEENT: MMM.  Vision and hearing grossly intact.  NECK: Supple.  No apparent JVD.  RESP:  No IWOB.  Fair aeration bilaterally. CVS:  RRR. Heart sounds normal.  ABD/GI/GU: BS+. Abd distended and tender.  MSK/EXT:   Moves extremities. No apparent deformity. No edema.  SKIN: no apparent skin lesion or wound NEURO: AA.  Oriented appropriately.  No apparent focal neuro deficit. PSYCH: Calm. Normal affect.   Consultants:  Vascular surgery Nephrology Pulmonology-placed temporary HD cath.  Procedures: 8/30-temporary HD cath placed by PCCM 8/31-PD catheter removed  Microbiology summarized: 8/30-peritoneal fluid culture with MSSA. 8/30-blood cultures NGTD 9/1-blood cultures NGTD  Assessment and plan: Sepsis most likely due to peritonitis associated with peritoneal dialysis [facility]): Present on arrival.  Heart fever and leukocytosis on presentation.  Culture data as above.  PD cath removed. -Now on intermittent HD.   -De-escalated to cefazolin , but developed a temperature and leukocytosis>> doxycycline  and Augmentin  -Repeat CT on 9/2 without acute intra-abdominal or pelvic abnormality but cirrhotic liver with moderate ascites. - Plan for [facility] before discharge.   ESRD now on HD -Now on IHD per nephrology -IR to place [facility] -Per nephrology, has outpatient chair, and can start on Saturday  Abdominal distention/pain/tenderness: Reports no BM in 4 days.  Passing gas.  No nausea or vomiting. -Encouraged ambulating in the hallway and getting out of the bed. -Also encouraged to minimize opiate use -Ambulate every 4 hours -Continue MiraLAX  twice daily -Add p.o. Senokot twice daily. -Check KUB  Hyperlipidemia: -Agree with holding statins.   Cirrhosis of the liver: Noted on CT. -Outpatient follow-up   History of hepatitis C: Noted.    Body mass  index is 27.38 kg/m.          DVT prophylaxis:  heparin  injection 5,000 Units Start: 06/03/24 0600  Code Status: Full code Family Communication: Updated patient's wife at bedside Level of care: Telemetry Cardiac Status is: Inpatient Remains inpatient appropriate because: Peritonitis   Final disposition: Home   55 minutes with more than 50% spent  in reviewing records, counseling patient/family and coordinating care.   Sch Meds:  Scheduled Meds:  (feeding supplement) PROSource Plus  30 mL Oral BID BM   Chlorhexidine  Gluconate Cloth  6 each Topical Q0600   feeding supplement  237 mL Oral BID BM   folic acid   1 mg Oral Daily   gabapentin   100 mg Oral BID   gentamicin  cream  1 Application Topical Daily   heparin   5,000 Units Subcutaneous Q8H   pantoprazole   40 mg Oral BID   polyethylene glycol  17 g Oral BID   senna-docusate  1 tablet Oral BID   sevelamer  carbonate  800 mg Oral TID WC   thiamine   100 mg Oral Daily   Continuous Infusions:  ampicillin -sulbactam (UNASYN ) IV 1.5 g (06/08/24 1112)   doxycycline  (VIBRAMYCIN ) IV 100 mg (06/08/24 1224)   PRN Meds:.camphor-menthol , HYDROmorphone  (DILAUDID ) injection, sodium chloride  flush  Antimicrobials: Anti-infectives (From admission, onward)    Start     Dose/Rate Route Frequency Ordered Stop   06/07/24 1000  ampicillin -sulbactam (UNASYN ) 1.5 g in sodium chloride  0.9 % 100 mL IVPB        1.5 g 200 mL/hr over 30 Minutes Intravenous Every 24 hours 06/06/24 1118     06/06/24 1000  linezolid  (ZYVOX ) tablet 600 mg  Status:  Discontinued        600 mg Oral Every 12 hours 06/06/24 0809 06/06/24 0813   06/06/24 1000  doxycycline  (VIBRA -TABS) tablet 100 mg  Status:  Discontinued        100 mg Oral Every 12 hours 06/06/24 0821 06/06/24 0823   06/06/24 1000  ampicillin -sulbactam (UNASYN ) 1.5 g in sodium chloride  0.9 % 100 mL IVPB  Status:  Discontinued        1.5 g 200 mL/hr over 30 Minutes Intravenous Every 12 hours 06/06/24 0821 06/06/24 1118   06/06/24 1000  doxycycline  (VIBRAMYCIN ) 100 mg in sodium chloride  0.9 % 250 mL IVPB        100 mg 125 mL/hr over 120 Minutes Intravenous Every 12 hours 06/06/24 0823     06/06/24 0900  amoxicillin -clavulanate (AUGMENTIN ) 500-125 MG per tablet 1 tablet  Status:  Discontinued        1 tablet Oral Every 8 hours 06/06/24 0813 06/06/24 0813    06/06/24 0900  amoxicillin -clavulanate (AUGMENTIN ) 500-125 MG per tablet 1 tablet  Status:  Discontinued        1 tablet Oral Daily 06/06/24 0813 06/06/24 0821   06/05/24 1800  ceFAZolin  (ANCEF ) IVPB 1 g/50 mL premix  Status:  Discontinued        1 g 100 mL/hr over 30 Minutes Intravenous Every 24 hours 06/05/24 1528 06/06/24 0809   06/05/24 1200  vancomycin  (VANCOCIN ) IVPB 1000 mg/200 mL premix  Status:  Discontinued        1,000 mg 200 mL/hr over 60 Minutes Intravenous  Once 06/04/24 1212 06/05/24 1528   06/03/24 0800  ceFEPIme  (MAXIPIME ) 1 g in sodium chloride  0.9 % 100 mL IVPB  Status:  Discontinued        1 g 200 mL/hr over 30 Minutes Intravenous Every 24 hours  06/03/24 0541 06/05/24 1528   06/03/24 0615  vancomycin  (VANCOREADY) IVPB 2000 mg/400 mL        2,000 mg 200 mL/hr over 120 Minutes Intravenous  Once 06/03/24 0611 06/03/24 1020        I have personally reviewed the following labs and images: CBC: Recent Labs  Lab 06/02/24 2125 06/03/24 0605 06/04/24 0714 06/05/24 1228 06/07/24 0423 06/08/24 0416  WBC 13.2* 10.5  --  15.5* 13.2* 14.0*  HGB 13.0 12.1* 16.0 12.5* 11.7* 11.7*  HCT 39.5 36.8* 47.0 37.9* 35.5* 35.0*  MCV 92.3 92.2  --  93.1 91.3 90.2  PLT 201 204  --  184 187 184   BMP &GFR Recent Labs  Lab 06/02/24 2125 06/03/24 0605 06/04/24 0714 06/05/24 1228 06/08/24 0416  NA 136  --  137 132* 136  K 5.0  --  4.9 4.3 4.8  CL 102  --  100 93* 98  CO2 19*  --   --  21* 21*  GLUCOSE 98  --  137* 133* 112*  BUN 80*  --  54* 77* 72*  CREATININE 9.14* 8.79* 7.00* 8.41* 8.56*  CALCIUM  9.1  --   --  7.5* 7.5*  MG  --   --   --   --  2.0  PHOS  --   --   --  6.1* 6.1*   Estimated Creatinine Clearance: 8 mL/min (A) (by C-G formula based on SCr of 8.56 mg/dL (H)). Liver & Pancreas: Recent Labs  Lab 06/02/24 2125 06/05/24 1228 06/08/24 0416  AST 65*  --   --   ALT 71*  --   --   ALKPHOS 116  --   --   BILITOT 0.9  --   --   PROT 7.9  --   --   ALBUMIN  3.0* 2.3* 2.1*   Recent Labs  Lab 06/02/24 2125  LIPASE 29   No results for input(s): AMMONIA in the last 168 hours. Diabetic: No results for input(s): HGBA1C in the last 72 hours. No results for input(s): GLUCAP in the last 168 hours. Cardiac Enzymes: No results for input(s): CKTOTAL, CKMB, CKMBINDEX, TROPONINI in the last 168 hours. No results for input(s): PROBNP in the last 8760 hours. Coagulation Profile: No results for input(s): INR, PROTIME in the last 168 hours. Thyroid Function Tests: No results for input(s): TSH, T4TOTAL, FREET4, T3FREE, THYROIDAB in the last 72 hours. Lipid Profile: No results for input(s): CHOL, HDL, LDLCALC, TRIG, CHOLHDL, LDLDIRECT in the last 72 hours. Anemia Panel: No results for input(s): VITAMINB12, FOLATE, FERRITIN, TIBC, IRON, RETICCTPCT in the last 72 hours. Urine analysis:    Component Value Date/Time   COLORURINE YELLOW 06/02/2024 2222   APPEARANCEUR CLEAR 06/02/2024 2222   LABSPEC 1.013 06/02/2024 2222   PHURINE 6.0 06/02/2024 2222   GLUCOSEU NEGATIVE 06/02/2024 2222   HGBUR NEGATIVE 06/02/2024 2222   BILIRUBINUR NEGATIVE 06/02/2024 2222   KETONESUR NEGATIVE 06/02/2024 2222   PROTEINUR >=300 (A) 06/02/2024 2222   NITRITE NEGATIVE 06/02/2024 2222   LEUKOCYTESUR NEGATIVE 06/02/2024 2222   Sepsis Labs: Invalid input(s): PROCALCITONIN, LACTICIDVEN  Microbiology: Recent Results (from the past 240 hours)  Body fluid culture w Gram Stain     Status: None   Collection Time: 06/03/24  4:43 AM   Specimen: Peritoneal Dialysate; Body Fluid  Result Value Ref Range Status   Specimen Description PERITONEAL DIALYSATE  Final   Special Requests Normal  Final   Gram Stain   Final  RARE WBC PRESENT, PREDOMINANTLY PMN RARE GRAM POSITIVE COCCI IN CLUSTERS IN PAIRS CRITICAL RESULT CALLED TO, READ BACK BY AND VERIFIED WITH: RN KETURAH MARINA [phone] AT 1438 BY EC Performed at [facility], [address] [place], [place] [address]    Culture MODERATE STAPHYLOCOCCUS AUREUS  Final   Report Status 06/05/2024 FINAL  Final   Organism ID, Bacteria STAPHYLOCOCCUS AUREUS  Final      Susceptibility   Staphylococcus aureus - MIC*    CIPROFLOXACIN <=0.5 SENSITIVE Sensitive     ERYTHROMYCIN 0.5 SENSITIVE Sensitive     GENTAMICIN  <=0.5 SENSITIVE Sensitive     OXACILLIN 0.5 SENSITIVE Sensitive     TETRACYCLINE <=1 SENSITIVE Sensitive     VANCOMYCIN  1 SENSITIVE Sensitive     TRIMETH/SULFA <=10 SENSITIVE Sensitive     CLINDAMYCIN <=0.25 SENSITIVE Sensitive     RIFAMPIN <=0.5 SENSITIVE Sensitive     Inducible Clindamycin NEGATIVE Sensitive     LINEZOLID  2 SENSITIVE Sensitive     * MODERATE STAPHYLOCOCCUS AUREUS  Culture, blood (Routine X 2) w Reflex to ID Panel     Status: None   Collection Time: 06/03/24  6:04 AM   Specimen: BLOOD  Result Value Ref Range Status   Specimen Description BLOOD LEFT ANTECUBITAL  Final   Special Requests   Final    BOTTLES DRAWN AEROBIC AND ANAEROBIC Blood Culture results may not be optimal due to an inadequate volume of blood received in culture bottles   Culture   Final    NO GROWTH 5 DAYS Performed at [facility], [address], [place], [place] [address]    Report Status 06/08/2024 FINAL  Final  Culture, blood (Routine X 2) w Reflex to ID Panel     Status: None   Collection Time: 06/03/24  6:04 AM   Specimen: BLOOD LEFT ARM  Result Value Ref Range Status   Specimen Description BLOOD LEFT ARM  Final   Special Requests   Final    BOTTLES DRAWN AEROBIC AND ANAEROBIC Blood Culture adequate volume   Culture   Final    NO GROWTH 5 DAYS Performed at [facility], [address], [place], [place] [address]    Report Status 06/08/2024 FINAL  Final  Aerobic/Anaerobic Culture w Gram Stain (surgical/deep wound)     Status: None (Preliminary result)   Collection Time: 06/04/24  8:27 AM   Specimen: Catheter Tip; Other  Result  Value Ref Range Status   Specimen Description CATH TIP  Final   Special Requests NONE  Final   Gram Stain   Final    FEW WBC SEEN NO ORGANISMS SEEN Performed at [facility], [address], [place], [place] [address]    Culture   Final    FEW STAPHYLOCOCCUS AUREUS NO ANAEROBES ISOLATED; CULTURE IN PROGRESS FOR 5 DAYS    Report Status PENDING  Incomplete   Organism ID, Bacteria STAPHYLOCOCCUS AUREUS  Final      Susceptibility   Staphylococcus aureus - MIC*    CIPROFLOXACIN <=0.5 SENSITIVE Sensitive     ERYTHROMYCIN <=0.25 SENSITIVE Sensitive     GENTAMICIN  <=0.5 SENSITIVE Sensitive     OXACILLIN 0.5 SENSITIVE Sensitive     TETRACYCLINE <=1 SENSITIVE Sensitive     VANCOMYCIN  1 SENSITIVE Sensitive     TRIMETH/SULFA <=10 SENSITIVE Sensitive     CLINDAMYCIN <=0.25 SENSITIVE Sensitive     RIFAMPIN <=0.5 SENSITIVE Sensitive     Inducible Clindamycin NEGATIVE  Sensitive     LINEZOLID  2 SENSITIVE Sensitive     * FEW STAPHYLOCOCCUS AUREUS  Culture, blood (Routine X 2) w Reflex to ID Panel     Status: None (Preliminary result)   Collection Time: 06/05/24  9:50 PM   Specimen: BLOOD  Result Value Ref Range Status   Specimen Description BLOOD SITE NOT SPECIFIED  Final   Special Requests   Final    BOTTLES DRAWN AEROBIC AND ANAEROBIC Blood Culture results may not be optimal due to an inadequate volume of blood received in culture bottles   Culture   Final    NO GROWTH 3 DAYS Performed at [facility], [address], [place], [place] [address]    Report Status PENDING  Incomplete  Culture, blood (Routine X 2) w Reflex to ID Panel     Status: None (Preliminary result)   Collection Time: 06/05/24  9:57 PM   Specimen: BLOOD  Result Value Ref Range Status   Specimen Description BLOOD SITE NOT SPECIFIED  Final   Special Requests   Final    BOTTLES DRAWN AEROBIC AND ANAEROBIC Blood Culture adequate volume   Culture   Final    NO GROWTH 3 DAYS Performed at [facility], [address], [place], [place] [address]    Report Status PENDING  Incomplete    Radiology Studies: No results found.    Shakthi Scipio T. Hind Chesler [facility]  If 7PM-7AM, please contact night-coverage [email or web address] 06/08/2024, 2:12 PM

## 2024-06-08 NOTE — Consult Note (Signed)
 Patient Status: [facility] - In-pt  Assessment and Plan: Patient in need of [facility] for HD.   Chart reviewed. VSS, afebrile. WBC remains elevated at 14 today, but blood cultures have been neg >24 hr, clinically appears improved otherwise, continues to receive IV abx.   Nephrology team feels that risk of line is not significant and reports plan for likely discharge this week based on more complete clinical picture.   ______________________________________________________________________   History of Present Illness: Joshua Maldonado is a 68 y.o. male with PMH of liver cirrhosis, ESRD on PD, HTN and paroxysmal SVT who presented with abdominal pain, weakness and fever on 8/30 to ED and was subsequently admitted with peritonitis associated with peritoneal dialysis. PD cath removed and temp cath was placed.   At this point in care, the temp cath has been removed by nephrology. Request made for [facility] as patient is nearing discharge and continues to require HD treatments. No access at this time.   Allergies and medications reviewed.   Review of Systems: A 12 point ROS discussed and pertinent positives are indicated in the HPI above.  All other systems are negative.  Review of Systems  Constitutional:  Negative for chills and fever.  Respiratory:  Positive for cough. Negative for shortness of breath.   Cardiovascular:  Negative for chest pain and leg swelling.  Gastrointestinal:  Positive for abdominal distention and abdominal pain. Negative for nausea and vomiting.  Skin:  Negative for rash.  Hematological:  Does not bruise/bleed easily.    Vital Signs: BP (!) 141/102 (BP Location: Left Arm)   Pulse 88   Temp 98 F (36.7 C) (Oral)   Resp 18   Ht 5' 7 (1.702 m)   Wt 174 lb 13.2 oz (79.3 kg)   SpO2 100%   BMI 27.38 kg/m   Physical Exam HENT:     Mouth/Throat:     Mouth: Mucous membranes are moist.     Pharynx: Oropharynx is clear.  Cardiovascular:     Rate and Rhythm: Normal rate and  regular rhythm.  Pulmonary:     Effort: Pulmonary effort is normal.     Breath sounds: Rhonchi present.  Abdominal:     General: There is distension.     Palpations: Abdomen is soft.     Tenderness: There is abdominal tenderness in the right lower quadrant and suprapubic area.  Musculoskeletal:     Right lower leg: No edema.     Left lower leg: No edema.  Skin:    General: Skin is warm and dry.  Neurological:     Mental Status: He is alert and oriented to person, place, and time.      Imaging reviewed.   Labs:  COAGS: No results for input(s): INR, APTT in the last 8760 hours.  BMP: Recent Labs    06/02/24 2125 06/03/24 0605 06/04/24 0714 06/05/24 1228 06/08/24 0416  NA 136  --  137 132* 136  K 5.0  --  4.9 4.3 4.8  CL 102  --  100 93* 98  CO2 19*  --   --  21* 21*  GLUCOSE 98  --  137* 133* 112*  BUN 80*  --  54* 77* 72*  CALCIUM  9.1  --   --  7.5* 7.5*  CREATININE 9.14* 8.79* 7.00* 8.41* 8.56*  GFRNONAA 6* 6*  --  6* 6*       Electronically Signed: [place] Mili Piltz, NP 06/08/2024, 11:44 AM   I spent a total  of 15 minutes in face to face in clinical consultation, greater than 50% of which was counseling/coordinating care for venous access.

## 2024-06-09 ENCOUNTER — Other Ambulatory Visit (HOSPITAL_COMMUNITY): Payer: Self-pay

## 2024-06-09 DIAGNOSIS — E7849 Other hyperlipidemia: Secondary | ICD-10-CM

## 2024-06-09 DIAGNOSIS — Z8619 Personal history of other infectious and parasitic diseases: Secondary | ICD-10-CM

## 2024-06-09 DIAGNOSIS — I10 Essential (primary) hypertension: Secondary | ICD-10-CM

## 2024-06-09 DIAGNOSIS — N186 End stage renal disease: Secondary | ICD-10-CM | POA: Diagnosis not present

## 2024-06-09 DIAGNOSIS — K7460 Unspecified cirrhosis of liver: Secondary | ICD-10-CM | POA: Diagnosis not present

## 2024-06-09 DIAGNOSIS — T8571XA Infection and inflammatory reaction due to peritoneal dialysis catheter, initial encounter: Secondary | ICD-10-CM | POA: Diagnosis not present

## 2024-06-09 LAB — RENAL FUNCTION PANEL
Albumin: 2 g/dL — ABNORMAL LOW (ref 3.5–5.0)
Anion gap: 15 (ref 5–15)
BUN: 45 mg/dL — ABNORMAL HIGH (ref 8–23)
CO2: 23 mmol/L (ref 22–32)
Calcium: 7.7 mg/dL — ABNORMAL LOW (ref 8.9–10.3)
Chloride: 97 mmol/L — ABNORMAL LOW (ref 98–111)
Creatinine, Ser: 6.22 mg/dL — ABNORMAL HIGH (ref 0.61–1.24)
GFR, Estimated: 9 mL/min — ABNORMAL LOW (ref >60)
Glucose, Bld: 102 mg/dL — ABNORMAL HIGH (ref 70–99)
Phosphorus: 5 mg/dL — ABNORMAL HIGH (ref 2.5–4.6)
Potassium: 4 mmol/L (ref 3.5–5.1)
Sodium: 135 mmol/L (ref 135–145)

## 2024-06-09 LAB — AEROBIC/ANAEROBIC CULTURE W GRAM STAIN (SURGICAL/DEEP WOUND)

## 2024-06-09 LAB — CBC
HCT: 34.6 % — ABNORMAL LOW (ref 39.0–52.0)
Hemoglobin: 11.8 g/dL — ABNORMAL LOW (ref 13.0–17.0)
MCH: 30 pg (ref 26.0–34.0)
MCHC: 34.1 g/dL (ref 30.0–36.0)
MCV: 88 fL (ref 80.0–100.0)
Platelets: 203 K/uL (ref 150–400)
RBC: 3.93 MIL/uL — ABNORMAL LOW (ref 4.22–5.81)
RDW: 13.4 % (ref 11.5–15.5)
WBC: 13.7 K/uL — ABNORMAL HIGH (ref 4.0–10.5)
nRBC: 0 % (ref 0.0–0.2)

## 2024-06-09 LAB — MAGNESIUM: Magnesium: 1.9 mg/dL (ref 1.7–2.4)

## 2024-06-09 MED ORDER — AMLODIPINE BESYLATE 10 MG PO TABS
10.0000 mg | ORAL_TABLET | Freq: Every day | ORAL | 0 refills | Status: DC
  Filled 2024-06-09: qty 90, 90d supply, fill #0

## 2024-06-09 MED ORDER — OXYCODONE HCL 5 MG PO TABS
5.0000 mg | ORAL_TABLET | Freq: Three times a day (TID) | ORAL | 0 refills | Status: AC | PRN
  Filled 2024-06-09: qty 15, 5d supply, fill #0

## 2024-06-09 MED ORDER — ACETAMINOPHEN 325 MG PO TABS: 650.0000 mg | ORAL_TABLET | Freq: Four times a day (QID) | ORAL | Status: AC | PRN

## 2024-06-09 MED ORDER — ALLOPURINOL 100 MG PO TABS
50.0000 mg | ORAL_TABLET | ORAL | 0 refills | Status: AC
  Filled 2024-06-09: qty 23, 92d supply, fill #0

## 2024-06-09 MED ORDER — POLYETHYLENE GLYCOL 3350 17 GM/SCOOP PO POWD
17.0000 g | Freq: Two times a day (BID) | ORAL | 2 refills | Status: DC | PRN
  Filled 2024-06-09: qty 238, 7d supply, fill #0

## 2024-06-09 MED ORDER — GABAPENTIN 100 MG PO CAPS
100.0000 mg | ORAL_CAPSULE | Freq: Two times a day (BID) | ORAL | 0 refills | Status: DC
  Filled 2024-06-09: qty 60, 30d supply, fill #0

## 2024-06-09 MED ORDER — AMOXICILLIN-POT CLAVULANATE 500-125 MG PO TABS
1.0000 | ORAL_TABLET | Freq: Every evening | ORAL | 0 refills | Status: DC
Start: 2024-06-09 — End: 2024-07-18
  Filled 2024-06-09: qty 16, 16d supply, fill #0

## 2024-06-09 MED ORDER — FOLIC ACID 1 MG PO TABS
1.0000 mg | ORAL_TABLET | Freq: Every day | ORAL | 0 refills | Status: AC
  Filled 2024-06-09: qty 30, 30d supply, fill #0

## 2024-06-09 MED ORDER — THIAMINE HCL 100 MG PO TABS
100.0000 mg | ORAL_TABLET | Freq: Every day | ORAL | 0 refills | Status: AC
  Filled 2024-06-09: qty 30, 30d supply, fill #0

## 2024-06-09 MED ORDER — SENNOSIDES-DOCUSATE SODIUM 8.6-50 MG PO TABS: 1.0000 | ORAL_TABLET | Freq: Two times a day (BID) | ORAL | Status: DC | PRN

## 2024-06-09 MED ORDER — ORAL CARE MOUTH RINSE: 15.0000 mL | OROMUCOSAL | Status: DC | PRN

## 2024-06-09 MED ORDER — SEVELAMER CARBONATE 800 MG PO TABS
800.0000 mg | ORAL_TABLET | Freq: Three times a day (TID) | ORAL | 0 refills | Status: AC
  Filled 2024-06-09: qty 90, 30d supply, fill #0

## 2024-06-09 NOTE — TOC Transition Note (Signed)
 Transition of Care [facility]) - Discharge Note   Patient Details  Name: Joshua Maldonado MRN: [id number] Date of Birth: [birth year]  Transition of Care [facility]) CM/SW Contact:  Tom-Johnson, Roselle Norton Daphne, RN Phone Number: 06/09/2024, 10:07 AM   Clinical Narrative:     Patient is scheduled for discharge today.  Readmission Risk Assessment done. Outpatient f/u, hospital f/u and discharge instructions on AVS. RW ordered from [employer] and [facility] to deliver to patient at the discharge lounge.  Prescriptions sent to [facility] pharmacy and patient will receive meds prior discharge. Cab voucher will be given at the discharge lounge to transport at discharge.  No further ICM needs noted.      Final next level of care: Home/Self Care Barriers to Discharge: Barriers Resolved   Patient Goals and CMS Choice Patient states their goals for this hospitalization and ongoing recovery are:: To return home CMS Medicare.gov Compare Post Acute Care list provided to:: Patient Choice offered to / list presented to : NA      Discharge Placement                Patient to be transferred to facility by: [facility]      Discharge Plan and Services Additional resources added to the After Visit Summary for   In-house Referral: Clinical Social Work              DME Arranged: Vannie rolling DME Agency: [employer] Date DME Agency Contacted: 06/09/24 Time DME Agency Contacted: [phone] Representative spoke with at DME Agency: [facility] HH Arranged: NA HH Agency: NA        Social Drivers of Health (SDOH) Interventions SDOH Screenings   Food Insecurity: No Food Insecurity (06/03/2024)  Housing: High Risk (06/03/2024)  Transportation Needs: No Transportation Needs (06/03/2024)  Utilities: Not At Risk (06/03/2024)  Social Connections: Moderately Integrated (06/03/2024)  Tobacco Use: High Risk (06/04/2024)     Readmission Risk Interventions    06/09/2024    9:55 AM  Readmission Risk Prevention Plan  Post Dischage  Appt Complete  Medication Screening Complete  Transportation Screening Complete

## 2024-06-09 NOTE — Progress Notes (Addendum)
 D/C order noted. Contacted attending and nephrology staff to inquire if pt will require iv abx with HD at d/c. Awaiting response. Contacted [facility] to advise clinic staff that pt will d/c today and should start tomorrow as planned. Clinic staff advised that navigator is awaiting response from medical staff as to if pt will require any iv abx with HD at d/c. Will update clinic staff as soon as info provided to navigator. Will assist as needed.   Randine Mungo Dialysis Navigator [phone]  Addendum at 10:09 am: Pt will not require iv abx with HD at d/c per medical staff. Will make clinic staff aware.  Addendum at 1:06 pm: Contacted clinic to be advised that at this time there is not a d/c summary or renal note for today. Faxed renal note and attending progress note from yesterday to clinic for continuation of care. Will fax d/c summary once completed. Clinic aware pt was d/c today and should start tomorrow.    Addendum at 2:22 pm: D/C summary faxed to clinic for continuation of care.

## 2024-06-09 NOTE — Discharge Summary (Signed)
 Physician Discharge Summary  PEARLIE LAFOSSE FMW:[id number] DOB: [birth year] DOA: 06/02/2024  PCP: Branda Hair, NP  Admit date: 06/02/2024 Discharge date: 06/09/24  Admitted From: Home Disposition: Home Recommendations for Outpatient Follow-up:  Outpatient follow-up with PCP in 1 to 2 weeks Recommend referral to GI for possible liver cirrhosis. Check CMP and CBC at follow-up Please follow up on the following pending results: None  Home Health: No need identified Equipment/Devices: Rolling walker  Discharge Condition: Stable CODE STATUS: Full code Diet Orders (From admission, onward)     Start     Ordered   06/08/24 1535  Diet renal with fluid restriction Fluid restriction: 1200 mL Fluid; Room service appropriate? Yes; Fluid consistency: Thin  Diet effective now       Question Answer Comment  Fluid restriction: 1200 mL Fluid   Room service appropriate? Yes   Fluid consistency: Thin      06/08/24 1535             Follow-up Information     [facility] at [facility] Follow up in 6 week(s).   Specialty: Vascular Surgery Why: The office will call you with your appointment Contact information: [address] [place]  [address] [phone]        Dialysis, [facility] [place]. Go on 06/10/2024.   Why: Schedule is Tuesday, Thursday, Saturday with 10:15 am start time.  Please arrive at 10:00 am. Contact information: [address] [place] [facility] [address] [phone]         Branda Hair, NP. Schedule an appointment as soon as possible for a visit in 1 week(s).   Specialty: Family Medicine Contact information: [address], [address] [place] [address] [phone]                 Hospital course 68 year old M with PMH of liver cirrhosis, ESRD on PD, HTN and paroxysmal SVT presenting with abdominal pain, weakness and fever and admitted with peritonitis associated with  peritoneal dialysis.  Patient has had diffuse abdominal pain which began 7 days prior to admission, accompanied by weakness and fevers.     In the ED was found afebrile.  WBC 13.  K 5.  CT scan of the abdomen and pelvis was suspicious for peritonitis with associated peritoneal dialysis catheter and inflammatory changes along the subcutaneous area    Vascular surgery consulted. Peritoneal fluid culture with MSSA.  PD catheter removed on 8/31.  Temporary HD cath placed started IHD.    Patient had TDC and HD on 9/4.  He has outpatient HD chair starting 9/6.  He is discharged on p.o. Augmentin  for 16 more days to complete a total of 21 days course for peritonitis.  See individual problem list below for more.   Problems addressed during this hospitalization Sepsis most likely due to peritonitis associated with peritoneal dialysis [facility]): Present on arrival.  Heart fever and leukocytosis on presentation.  Culture data as above.  PD cath removed. -De-escalated to cefazolin , but developed a temperature and leukocytosis>> doxycycline  and Augmentin  -Repeat CT on 9/2 without acute intra-abdominal or pelvic abnormality but cirrhotic liver with moderate ascites. -Discharged on p.o. Augmentin  500/125 every evening.   ESRD now on HD -PD cath removed. -TDC placed on 9/4. -Outpatient HD TTS starting 9/6.   Abdominal distention/pain/tenderness:  No nausea or vomiting.  Passing gas.  Tolerating diet.  KUB with normal gas pattern.  Pain and tenderness improved. -  Antibiotics as above -Continue MiraLAX  and Senokot-S -Encouraged to move more.  Essential hypertension: BP slightly elevated. -Resume home amlodipine .   Hyperlipidemia: -Continue home statin.   Cirrhosis of the liver: Noted on CT. - Recommend ambulatory referral to GI for further evaluation.    History of hepatitis C: Noted. -No outpatient follow-up    Body mass index is 27.11 kg/m.           Consultations: Nephrology Vascular  surgery Interventional radiology Pulmonology-for temporary HD cath  Time spent 35  minutes  Vital signs Vitals:   06/08/24 2136 06/08/24 2232 06/09/24 0449 06/09/24 0737  BP: (!) 137/99 (!) 156/117 (!) 154/104 (!) 127/103  Pulse: 96 82 91 92  Temp: 98.6 F (37 C) 98.6 F (37 C) 98.7 F (37.1 C) 99 F (37.2 C)  Resp: 20 20 20 18   Height:      Weight: 78.5 kg     SpO2: 95% 97% 98% 96%  TempSrc: Oral Oral Oral Oral  BMI (Calculated): 27.1        Discharge exam  GENERAL: No apparent distress.  Nontoxic. HEENT: MMM.  Vision and hearing grossly intact.  NECK: Supple.  No apparent JVD.  RESP:  No IWOB.  Fair aeration bilaterally. CVS:  RRR. Heart sounds normal.  ABD/GI/GU: BS+. Abd soft and slightly tender. MSK/EXT:  Moves extremities. No apparent deformity. No edema.  SKIN: no apparent skin lesion or wound NEURO: Awake and alert. Oriented appropriately.  No apparent focal neuro deficit. PSYCH: Calm. Normal affect.   Discharge Instructions Discharge Instructions     Discharge instructions   Complete by: As directed    It has been a pleasure taking care of you!  You were hospitalized due to the abdominal infection likely from peritoneal dialysis.  We have started you on antibiotics.  We are discharging him on antibiotics to complete treatment course.  Is very important that you complete the whole course of antibiotics regardless of improvement.  Review your new medication list and the directions on your medications before you take them.  Continue dialysis outpatient.  Follow-up with your primary care doctor in 1 to 2 weeks or sooner if needed.  We also recommend you follow-up with gastroenterology about your liver for further evaluation.   Take care,   Increase activity slowly   Complete by: As directed    No wound care   Complete by: As directed       Allergies as of 06/09/2024   No Known Allergies      Medication List     STOP taking these medications     colchicine  0.6 MG tablet   furosemide  20 MG tablet Commonly known as: LASIX    gabapentin  600 MG tablet Commonly known as: NEURONTIN  Replaced by: gabapentin  100 MG capsule   hydrALAZINE  10 MG tablet Commonly known as: APRESOLINE    Indomethacin  20 MG Caps   metoprolol  tartrate 50 MG tablet Commonly known as: LOPRESSOR    oxyCODONE -acetaminophen  5-325 MG tablet Commonly known as: PERCOCET/ROXICET   potassium chloride  10 MEQ tablet Commonly known as: KLOR-CON        TAKE these medications    acetaminophen  325 MG tablet Commonly known as: Tylenol  Take 2 tablets (650 mg total) by mouth every 6 (six) hours as needed.   allopurinol  100 MG tablet Commonly known as: ZYLOPRIM  Take 0.5 tablets (50 mg total) by mouth every other day. What changed:  how much to take when to take this reasons to take this   amLODipine  10  MG tablet Commonly known as: NORVASC  Take 1 tablet (10 mg total) by mouth daily.   amoxicillin -clavulanate 500-125 MG tablet Commonly known as: Augmentin  Take 1 tablet by mouth every evening.   atorvastatin  10 MG tablet Commonly known as: LIPITOR Take 10 mg by mouth daily.   DULoxetine 30 MG capsule Commonly known as: CYMBALTA Take 30 mg by mouth daily.   folic acid  1 MG tablet Commonly known as: FOLVITE  Take 1 tablet (1 mg total) by mouth daily.   gabapentin  100 MG capsule Commonly known as: NEURONTIN  Take 1 capsule (100 mg total) by mouth 2 (two) times daily. Replaces: gabapentin  600 MG tablet   oxyCODONE  5 MG immediate release tablet Commonly known as: Roxicodone  Take 1 tablet (5 mg total) by mouth every 8 (eight) hours as needed for up to 5 days for severe pain (pain score 7-10).   pantoprazole  40 MG tablet Commonly known as: PROTONIX  Take 1 tablet (40 mg total) by mouth 2 (two) times daily before a meal.   polyethylene glycol powder 17 GM/SCOOP powder Commonly known as: GLYCOLAX /MIRALAX  Dissolve 1 capful (17g) in 4-8 ounces of liquid  and take by mouth 2 (two) times daily as needed for moderate constipation or mild constipation.   senna-docusate 8.6-50 MG tablet Commonly known as: Senokot-S Take 1-2 tablets by mouth 2 (two) times daily between meals as needed for mild constipation or moderate constipation.   sevelamer  carbonate 800 MG tablet Commonly known as: RENVELA  Take 1 tablet (800 mg total) by mouth 3 (three) times daily with meals.   sildenafil  20 MG tablet Commonly known as: REVATIO  Take 1-5 tablets as needed prior to sexual activity   thiamine  100 MG tablet Commonly known as: VITAMIN B1 Take 1 tablet (100 mg total) by mouth daily.               Durable Medical Equipment  (From admission, onward)           Start     Ordered   06/09/24 1006  For home use only DME Walker  Once       Question:  Patient needs a walker to treat with the following condition  Answer:  Gait instability   06/09/24 1005             Procedures/Studies: 8/30-temporary HD cath placed by PCCM 8/31-PD catheter removed 9/4-right IJ TDC   DG Abd 1 View Result Date: 06/08/2024 CLINICAL DATA:  Abdominal distension. EXAM: ABDOMEN - 1 VIEW COMPARISON:  CT abdomen pelvis dated 06/06/2024. FINDINGS: Oral contrast noted throughout the colon. No bowel dilatation or evidence of obstruction. No free air. Degenerative changes of the spine and lumbar fusion hardware. No acute osseous pathology. Left femoral nail. IMPRESSION: Nonobstructive bowel gas pattern. Electronically Signed   By: Vanetta Chou M.D.   On: 06/08/2024 18:56   IR TUNNELED CENTRAL VENOUS CATH [facility] W IMG Result Date: 06/08/2024 INDICATION: ESRD requiring HD.  Previously on peritoneal dialysis EXAM: TUNNELED CENTRAL VENOUS HEMODIALYSIS CATHETER PLACEMENT WITH ULTRASOUND AND FLUOROSCOPIC GUIDANCE MEDICATIONS: The patient was on scheduled IV antibiotics. ANESTHESIA/SEDATION: Moderate (conscious) sedation was employed during this procedure. A total of Versed  0.5 mg  and Fentanyl  25 mcg was administered intravenously. Moderate Sedation Time: 15 minutes. The patient's level of consciousness and vital signs were monitored continuously by radiology nursing throughout the procedure under my direct supervision. FLUOROSCOPY: Radiation Exposure Index and estimated peak skin dose (PSD); Reference air kerma (RAK), 41.4 mGy. COMPLICATIONS: None immediate. PROCEDURE: Informed written consent was obtained  from the patient after a discussion of the risks, benefits, and alternatives to treatment. Questions regarding the procedure were encouraged and answered. The RIGHT neck and chest were prepped with chlorhexidine  in a sterile fashion, and a sterile drape was applied covering the operative field. Maximum barrier sterile technique with sterile gowns and gloves were used for the procedure. A timeout was performed prior to the initiation of the procedure. After creating a small venotomy incision, a micropuncture kit was utilized to access the internal jugular vein. Real-time ultrasound guidance was utilized for vascular access including the acquisition of a permanent ultrasound image documenting patency of the accessed vessel. The microwire was utilized to measure appropriate catheter length. A stiff Glidewire was advanced to the level of the IVC and the micropuncture sheath was exchanged for a peel-away sheath. A palindrome tunneled hemodialysis catheter measuring 19 cm from tip to cuff was tunneled in a retrograde fashion from the anterior chest wall to the venotomy incision. The catheter was then placed through the peel-away sheath with tips ultimately positioned within the superior aspect of the right atrium. Final catheter positioning was confirmed and documented with a spot radiographic image. The catheter aspirates and flushes normally. The catheter was flushed with appropriate volume heparin  dwells. The catheter exit site was secured with a 0-Prolene retention suture. The venotomy  incision was closed with Dermabond. Dressings were applied. The patient tolerated the procedure well without immediate post procedural complication. IMPRESSION: Successful placement of 19 cm tip to cuff tunneled hemodialysis catheter via the RIGHT internal jugular vein. The tip of the catheter is positioned within the proximal RIGHT atrium. The catheter is ready for immediate use. Thom Hall, MD [facility] Electronically Signed   By: Thom Hall M.D.   On: 06/08/2024 14:50   CT ABDOMEN PELVIS WO CONTRAST Result Date: 06/06/2024 CLINICAL DATA:  Abdominal pain, acute, nonlocalized EXAM: CT ABDOMEN AND PELVIS WITHOUT CONTRAST TECHNIQUE: Multidetector CT imaging of the abdomen and pelvis was performed following the standard protocol without IV contrast. RADIATION DOSE REDUCTION: This exam was performed according to the departmental dose-optimization program which includes automated exposure control, adjustment of the mA and/or kV according to patient size and/or use of iterative reconstruction technique. COMPARISON:  June 03, 2024 FINDINGS: Of note, the lack of intravenous contrast limits evaluation of the solid organ parenchyma and vascularity. Lower chest: Small right pleural effusion. Subsegmental atelectasis in the lingula. Hepatobiliary: Cirrhotic liver. Unchanged small calcification in the right hepatic lobe. No mass.No radiopaque stones or wall thickening of the gallbladder. No intrahepatic or extrahepatic biliary ductal dilation. Pancreas: No mass or main ductal dilation. No peripancreatic inflammation or fluid collection. Spleen: Normal size. No mass. Adrenals/Urinary Tract: No adrenal masses. Small subcentimeter hyperdensity in the right interpolar region (axial 31), likely a hemorrhagic cyst. No hydronephrosis or nephrolithiasis. The urinary bladder is completely decompressed. Stomach/Bowel: The stomach is decompressed without focal  abnormality. No small bowel wall thickening or inflammation. No small bowel obstruction.No extravasation of enteric contrast to suggest bowel perforation.Normal decompressed appendix, best visualized on the sagittal series. Vascular/Lymphatic: No aortic aneurysm. Scattered aortoiliac atherosclerosis. Similar appearance of mildly enlarged left periaortic lymph nodes measuring up to 1 cm, likely reactive. Reproductive: No prostatomegaly. No free pelvic fluid. Other: Moderate volume ascites. Diffuse nodularity throughout the omentum and mesentery, likely nodular fluid given the overall progression since June 03, 2024. No pneumoperitoneum. Interval removal of the peritoneal dialysis catheter. Musculoskeletal: No acute fracture or destructive lesion. IMPRESSION: 1. No acute  intra-abdominal or pelvic abnormality. 2. Cirrhotic liver again noted. Moderate volume ascites. Diffuse nodularity throughout the omentum and mesentery, likely nodular fluid given the overall progression since June 03, 2024. Interval removal of the peritoneal dialysis catheter. Nonemergent fluid sampling should be considered. 3. Small right pleural effusion. Aortic Atherosclerosis (ICD10-I70.0). Electronically Signed   By: Rogelia Myers M.D.   On: 06/06/2024 19:08   DG Chest Port 1 View Result Date: 06/03/2024 CLINICAL DATA:  Central line placement EXAM: PORTABLE CHEST 1 VIEW COMPARISON:  Chest x-ray 02/04/2020 FINDINGS: Right-sided central venous catheter is new with distal tip projecting over the distal SVC. The cardiomediastinal silhouette is stable and within normal limits. Lung volumes are low likely accentuating central pulmonary vascularity. There some patchy retrocardiac opacities, unchanged. Questionable new nodular density in the right lung apex measuring 6 mm. There is no pleural effusion or pneumothorax. No acute fractures are seen. IMPRESSION: 1. Right-sided central venous catheter with distal tip projecting over the distal SVC.  No pneumothorax. 2. Questionable new nodular density in the right lung apex measuring 6 mm. Recommend further evaluation with chest CT. Electronically Signed   By: Greig Pique M.D.   On: 06/03/2024 17:39   CT ABDOMEN PELVIS WO CONTRAST Result Date: 06/03/2024 CLINICAL DATA:  68 year old male with abdominal pain at peritoneal dialysis site. Vomiting and malaise. EXAM: CT ABDOMEN AND PELVIS WITHOUT CONTRAST TECHNIQUE: Multidetector CT imaging of the abdomen and pelvis was performed following the standard protocol without IV contrast. RADIATION DOSE REDUCTION: This exam was performed according to the departmental dose-optimization program which includes automated exposure control, adjustment of the mA and/or kV according to patient size and/or use of iterative reconstruction technique. COMPARISON:  CT Abdomen and Pelvis 09/02/2020. FINDINGS: Lower chest: Mild but increased cardiomegaly since 2021. No pericardial or pleural effusion. Mild left lung base atelectasis. Hepatobiliary: Chronically nodular and cirrhotic liver. Small volume perihepatic free fluid with mostly simple fluid density. Mild gallbladder wall thickening. Pancreas: Negative noncontrast appearance. Spleen: Negative for splenomegaly. Small volume of perisplenic fluid with some complex fluid density. Adjacent left upper quadrant omental edema and/or inflammation. Adrenals/Urinary Tract: Negative adrenal glands. Negative kidneys. Diminutive ureters. Generalized urinary bladder wall thickening and adjacent edema or inflammation in the lower abdominal mesentery (series 3, image 63). Incidental pelvic phleboliths. Stomach/Bowel: Fluid and inflammatory stranding along the subcutaneous course of the peritoneal dialysis catheter just to the right of the umbilicus series 3, image 49). The catheter is looped in the lower abdomen (series 3, image 59) situated amongst small bowel loops which appear indistinct, abnormally thickened. Small volume of free fluid in  the left lower quadrant ranges from load a complex fluid density, and appears mildly spiculated, loculated (series 3, image 61). A small volume of free fluid in both pericolic gutters has more simple fluid density. No bowel transition point. Confluent mesenteric stranding in the left upper quadrant at the greater omentum (series 3, image 17). Nondilated appendix. Nondilated stomach. No pneumoperitoneum identified. Vascular/Lymphatic: Aortoiliac calcified atherosclerosis. Stable aortoiliac tortuosity, nonaneurysmal abdominal aorta. Reproductive: Negative. Other: Trace free fluid in the deep pelvis with mildly complex fluid density. Musculoskeletal: Chronic advanced lumbar spine degeneration and previous lumbosacral spinal fusion. 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 Severe adjacent segment disease at L1-L2 with pronounced vacuum disc and endplate degeneration. Stable hardware. Chronic proximal left femur intramedullary rod partially visible. No acute osseous abnormality identified. IMPRESSION: 1. Constellation suspicious for Peritonitis in association with peritoneal dialysis catheter, including fluid and inflammation along the subcutaneous catheter course,  bowel and mesenteric inflammation about the catheter, small volume of complex fluid in the abdomen and pelvis, and greater omental inflammation in the left upper quadrant, remote from the catheter site. 2. No organized or drainable fluid collection evident on this noncontrast exam. No discrete bowel obstruction. 3. Underlying chronic Cirrhotic liver. Mild cardiomegaly. Aortic Atherosclerosis (ICD10-I70.0). Electronically Signed   By: VEAR Hurst M.D.   On: 06/03/2024 04:09       The results of significant diagnostics from this hospitalization (including imaging, microbiology, ancillary and laboratory) are listed below for reference.     Microbiology: Recent Results (from the past 240 hours)  Body fluid culture w Gram Stain      Status: None   Collection Time: 06/03/24  4:43 AM   Specimen: Peritoneal Dialysate; Body Fluid  Result Value Ref Range Status   Specimen Description PERITONEAL DIALYSATE  Final   Special Requests Normal  Final   Gram Stain   Final    RARE WBC PRESENT, PREDOMINANTLY PMN RARE GRAM POSITIVE COCCI IN CLUSTERS IN PAIRS CRITICAL RESULT CALLED TO, READ BACK BY AND VERIFIED WITH: RN KETURAH MARINA [phone] AT 1438 BY EC Performed at [facility], [address] [place], [place] [address]    Culture MODERATE STAPHYLOCOCCUS AUREUS  Final   Report Status 06/05/2024 FINAL  Final   Organism ID, Bacteria STAPHYLOCOCCUS AUREUS  Final      Susceptibility   Staphylococcus aureus - MIC*    CIPROFLOXACIN <=0.5 SENSITIVE Sensitive     ERYTHROMYCIN 0.5 SENSITIVE Sensitive     GENTAMICIN  <=0.5 SENSITIVE Sensitive     OXACILLIN 0.5 SENSITIVE Sensitive     TETRACYCLINE <=1 SENSITIVE Sensitive     VANCOMYCIN  1 SENSITIVE Sensitive     TRIMETH/SULFA <=10 SENSITIVE Sensitive     CLINDAMYCIN <=0.25 SENSITIVE Sensitive     RIFAMPIN <=0.5 SENSITIVE Sensitive     Inducible Clindamycin NEGATIVE Sensitive     LINEZOLID  2 SENSITIVE Sensitive     * MODERATE STAPHYLOCOCCUS AUREUS  Culture, blood (Routine X 2) w Reflex to ID Panel     Status: None   Collection Time: 06/03/24  6:04 AM   Specimen: BLOOD  Result Value Ref Range Status   Specimen Description BLOOD LEFT ANTECUBITAL  Final   Special Requests   Final    BOTTLES DRAWN AEROBIC AND ANAEROBIC Blood Culture results may not be optimal due to an inadequate volume of blood received in culture bottles   Culture   Final    NO GROWTH 5 DAYS Performed at [facility], [address], [place], [place] [address]    Report Status 06/08/2024 FINAL  Final  Culture, blood (Routine X 2) w Reflex to ID Panel     Status: None   Collection Time: 06/03/24  6:04 AM   Specimen: BLOOD LEFT ARM  Result Value Ref Range Status   Specimen Description BLOOD LEFT ARM   Final   Special Requests   Final    BOTTLES DRAWN AEROBIC AND ANAEROBIC Blood Culture adequate volume   Culture   Final    NO GROWTH 5 DAYS Performed at [facility], [address], [place], [place] [address]    Report Status 06/08/2024 FINAL  Final  Aerobic/Anaerobic Culture w Gram Stain (surgical/deep wound)     Status: None (Preliminary result)   Collection Time: 06/04/24  8:27 AM   Specimen: Catheter Tip; Other  Result Value Ref Range Status   Specimen Description CATH TIP  Final   Special Requests NONE  Final   Gram Stain   Final    FEW WBC SEEN NO ORGANISMS SEEN Performed at [facility], [address], [place], [place] [address]    Culture   Final    FEW STAPHYLOCOCCUS AUREUS NO ANAEROBES ISOLATED; CULTURE IN PROGRESS FOR 5 DAYS    Report Status PENDING  Incomplete   Organism ID, Bacteria STAPHYLOCOCCUS AUREUS  Final      Susceptibility   Staphylococcus aureus - MIC*    CIPROFLOXACIN <=0.5 SENSITIVE Sensitive     ERYTHROMYCIN <=0.25 SENSITIVE Sensitive     GENTAMICIN  <=0.5 SENSITIVE Sensitive     OXACILLIN 0.5 SENSITIVE Sensitive     TETRACYCLINE <=1 SENSITIVE Sensitive     VANCOMYCIN  1 SENSITIVE Sensitive     TRIMETH/SULFA <=10 SENSITIVE Sensitive     CLINDAMYCIN <=0.25 SENSITIVE Sensitive     RIFAMPIN <=0.5 SENSITIVE Sensitive     Inducible Clindamycin NEGATIVE Sensitive     LINEZOLID  2 SENSITIVE Sensitive     * FEW STAPHYLOCOCCUS AUREUS  Culture, blood (Routine X 2) w Reflex to ID Panel     Status: None (Preliminary result)   Collection Time: 06/05/24  9:50 PM   Specimen: BLOOD  Result Value Ref Range Status   Specimen Description BLOOD SITE NOT SPECIFIED  Final   Special Requests   Final    BOTTLES DRAWN AEROBIC AND ANAEROBIC Blood Culture results may not be optimal due to an inadequate volume of blood received in culture bottles   Culture   Final    NO GROWTH 4 DAYS Performed at [facility], [address], [place], [place] [address]     Report Status PENDING  Incomplete  Culture, blood (Routine X 2) w Reflex to ID Panel     Status: None (Preliminary result)   Collection Time: 06/05/24  9:57 PM   Specimen: BLOOD  Result Value Ref Range Status   Specimen Description BLOOD SITE NOT SPECIFIED  Final   Special Requests   Final    BOTTLES DRAWN AEROBIC AND ANAEROBIC Blood Culture adequate volume   Culture   Final    NO GROWTH 4 DAYS Performed at [facility], [address], [place], [place] [address]    Report Status PENDING  Incomplete     Labs:  CBC: Recent Labs  Lab 06/03/24 0605 06/04/24 0714 06/05/24 1228 06/07/24 0423 06/08/24 0416 06/09/24 0444  WBC 10.5  --  15.5* 13.2* 14.0* 13.7*  HGB 12.1* 16.0 12.5* 11.7* 11.7* 11.8*  HCT 36.8* 47.0 37.9* 35.5* 35.0* 34.6*  MCV 92.2  --  93.1 91.3 90.2 88.0  PLT 204  --  184 187 184 203   BMP &GFR Recent Labs  Lab 06/02/24 2125 06/03/24 0605 06/04/24 0714 06/05/24 1228 06/08/24 0416 06/09/24 0444  NA 136  --  137 132* 136 135  K 5.0  --  4.9 4.3 4.8 4.0  CL 102  --  100 93* 98 97*  CO2 19*  --   --  21* 21* 23  GLUCOSE 98  --  137* 133* 112* 102*  BUN 80*  --  54* 77* 72* 45*  CREATININE 9.14* 8.79* 7.00* 8.41* 8.56* 6.22*  CALCIUM  9.1  --   --  7.5* 7.5* 7.7*  MG  --   --   --   --  2.0 1.9  PHOS  --   --   --  6.1* 6.1* 5.0*  Estimated Creatinine Clearance: 11.1 mL/min (A) (by C-G formula based on SCr of 6.22 mg/dL (H)). Liver & Pancreas: Recent Labs  Lab 06/02/24 2125 06/05/24 1228 06/08/24 0416 06/09/24 0444  AST 65*  --   --   --   ALT 71*  --   --   --   ALKPHOS 116  --   --   --   BILITOT 0.9  --   --   --   PROT 7.9  --   --   --   ALBUMIN 3.0* 2.3* 2.1* 2.0*   Recent Labs  Lab 06/02/24 2125  LIPASE 29   No results for input(s): AMMONIA in the last 168 hours. Diabetic: No results for input(s): HGBA1C in the last 72 hours. No results for input(s): GLUCAP in the last 168 hours. Cardiac Enzymes: No results for  input(s): CKTOTAL, CKMB, CKMBINDEX, TROPONINI in the last 168 hours. No results for input(s): PROBNP in the last 8760 hours. Coagulation Profile: No results for input(s): INR, PROTIME in the last 168 hours. Thyroid Function Tests: No results for input(s): TSH, T4TOTAL, FREET4, T3FREE, THYROIDAB in the last 72 hours. Lipid Profile: No results for input(s): CHOL, HDL, LDLCALC, TRIG, CHOLHDL, LDLDIRECT in the last 72 hours. Anemia Panel: No results for input(s): VITAMINB12, FOLATE, FERRITIN, TIBC, IRON, RETICCTPCT in the last 72 hours. Urine analysis:    Component Value Date/Time   COLORURINE YELLOW 06/02/2024 2222   APPEARANCEUR CLEAR 06/02/2024 2222   LABSPEC 1.013 06/02/2024 2222   PHURINE 6.0 06/02/2024 2222   GLUCOSEU NEGATIVE 06/02/2024 2222   HGBUR NEGATIVE 06/02/2024 2222   BILIRUBINUR NEGATIVE 06/02/2024 2222   KETONESUR NEGATIVE 06/02/2024 2222   PROTEINUR >=300 (A) 06/02/2024 2222   NITRITE NEGATIVE 06/02/2024 2222   LEUKOCYTESUR NEGATIVE 06/02/2024 2222   Sepsis Labs: Invalid input(s): PROCALCITONIN, LACTICIDVEN   SIGNED:  Zaevion Parke T Afshin Chrystal, MD  [facility] 06/09/2024, 2:09 PM

## 2024-06-09 NOTE — Progress Notes (Signed)
 Received patient in bed to unit.  Alert and oriented.  Informed consent signed and in chart.   TX duration: 3.5 hours  Patient tolerated well.  Transported back to the room  Alert, without acute distress.  Hand-off given to patient's nurse.   Access used: catheter Access issues: none  Total UF removed: 2500 ml Medication(s) given: none Post HD VS: 137/99 Post HD weight: unable to obtain   06/08/24 2136  Vitals  Temp 98.6 F (37 C)  Temp Source Oral  BP (!) 137/99  MAP (mmHg) 111  BP Location Left Arm  BP Method Automatic  Patient Position (if appropriate) Lying  Pulse Rate 96  ECG Heart Rate (!) 101  Resp 20  Weight 78.5 kg  Type of Weight Post-Dialysis  Oxygen Therapy  SpO2 95 %  O2 Device Room Air  Patient Activity (if Appropriate) In bed  Pulse Oximetry Type Continuous  Oximetry Probe Site Changed No  During Treatment Monitoring  Blood Flow Rate (mL/min) 0 mL/min  Arterial Pressure (mmHg) 1.01 mmHg  Venous Pressure (mmHg) -3.23 mmHg  TMP (mmHg) -33.13 mmHg  Ultrafiltration Rate (mL/min) 910 mL/min  Dialysate Flow Rate (mL/min) 300 ml/min  Duration of HD Treatment -hour(s) 3.5 hour(s)  Cumulative Fluid Removed (mL) per Treatment  2500.17  Post Treatment  Dialyzer Clearance Lightly streaked  Hemodialysis Intake (mL) 0 mL  Liters Processed 73.5  Fluid Removed (mL) 2500 mL  Tolerated HD Treatment Yes  Post-Hemodialysis Comments (S)  HD tx completed as expected, tolerated well, no complaints, pt is stable.  AVG/AVF Arterial Site Held (minutes) 0 minutes  AVG/AVF Venous Site Held (minutes) 0 minutes  Note  Patient Observations (S)  pt is in bed resting comfortably.  Hemodialysis Catheter Right Internal jugular Double lumen Permanent (Tunneled)  Placement Date/Time: 06/08/24 1437   Placed prior to admission: Yes  Serial / Lot #: [id number]  Expiration Date: 01/02/29  Time Out: Correct patient;Correct site;Correct procedure  Maximum sterile barrier precautions:  Hand hygiene;Sterile probe cover;...  Site Condition No complications  Blue Lumen Status Flushed;Heparin  locked;Dead end cap in place  Red Lumen Status Flushed;Heparin  locked;Dead end cap in place  Purple Lumen Status N/A  Catheter fill solution Heparin  1000 units/ml  Catheter fill volume (Arterial) 1.6 cc  Catheter fill volume (Venous) 1.6  Dressing Type Transparent  Dressing Status Antimicrobial disc/dressing in place  Drainage Description Serosanguineous  Post treatment catheter status Capped and Clamped      [facility]

## 2024-06-09 NOTE — Plan of Care (Signed)
   Problem: Education: Goal: Knowledge of General Education information will improve Description: Including pain rating scale, medication(s)/side effects and non-pharmacologic comfort measures Outcome: Completed/Met

## 2024-06-10 LAB — CULTURE, BLOOD (ROUTINE X 2)
Culture: NO GROWTH
Culture: NO GROWTH
Special Requests: ADEQUATE

## 2024-07-03 ENCOUNTER — Other Ambulatory Visit (HOSPITAL_COMMUNITY): Payer: Self-pay

## 2024-07-03 ENCOUNTER — Other Ambulatory Visit: Payer: Self-pay

## 2024-07-03 ENCOUNTER — Ambulatory Visit
Admission: RE | Admit: 2024-07-03 | Discharge: 2024-07-03 | Disposition: A | Discharge status: Home / Self Care | Source: Ambulatory Visit

## 2024-07-03 DIAGNOSIS — R1900 Intra-abdominal and pelvic swelling, mass and lump, unspecified site: Secondary | ICD-10-CM

## 2024-07-03 DIAGNOSIS — R109 Unspecified abdominal pain: Secondary | ICD-10-CM

## 2024-07-03 DIAGNOSIS — K7460 Unspecified cirrhosis of liver: Secondary | ICD-10-CM

## 2024-07-12 ENCOUNTER — Ambulatory Visit (HOSPITAL_COMMUNITY)
Admission: RE | Admit: 2024-07-12 | Discharge: 2024-07-12 | Disposition: A | Discharge status: Home / Self Care | Source: Ambulatory Visit

## 2024-07-12 DIAGNOSIS — K7460 Unspecified cirrhosis of liver: Secondary | ICD-10-CM | POA: Insufficient documentation

## 2024-07-12 DIAGNOSIS — R109 Unspecified abdominal pain: Secondary | ICD-10-CM | POA: Diagnosis present

## 2024-07-17 ENCOUNTER — Encounter: Payer: Self-pay | Admitting: Surgery

## 2024-07-17 ENCOUNTER — Ambulatory Visit: Attending: Surgery | Admitting: Surgery

## 2024-07-17 VITALS — BP 149/109 | HR 82 | Temp 98.1°F | Ht 67.0 in | Wt 168.0 lb

## 2024-07-17 DIAGNOSIS — N186 End stage renal disease: Secondary | ICD-10-CM

## 2024-07-17 DIAGNOSIS — Z992 Dependence on renal dialysis: Secondary | ICD-10-CM | POA: Diagnosis not present

## 2024-07-17 NOTE — Progress Notes (Signed)
 [facility] [place]  Patient name: Joshua Maldonado MRN: [id number] DOB: November 19, 1955 Sex: male   REASON FOR VISIT:    Follow-up  HISOTRY OF PRESENT ILLNESS:    Joshua Maldonado is a 68 y.o. male who has undergone the following dialysis procedures  12/09/2023: Right brachiocephalic fistula ([facility]onsignore) 06/04/2024: Peritoneal dialysis catheter removal secondary to infection [facility])  His peritoneal catheter was placed in [place].  He now dialyzes through a catheter.  He is here for new access.  He is left-handed  The patient has a history of cirrhosis, likely related to hepatitis.  He takes a statin for hypercholesterolemia.  He currently works as a Naval architect.   PAST MEDICAL HISTORY:   Past Medical History:  Diagnosis Date   Cirrhosis [facility])    Per patient, diagnosed at [facility].  Reports liver biopsy at [facility].  Suspected to be secondary to hepatitis C.and ETOH.    CKD (chronic kidney disease)    Dilation of biliary tract    Dilated CBD. MRI May 2021 suggesting benign variation.   GERD (gastroesophageal reflux disease)    Gout    Hepatitis C    Per patient, s/p treatment with Harvoni at [facility]; hep C RNA not detected April 2021.   HLD (hyperlipidemia)    Hypertension    LVH (left ventricular hypertrophy)    Moderate, normal EF.   Paroxysmal SVT (supraventricular tachycardia)    Sleep apnea    uses CPAP occ     FAMILY HISTORY:   Family History  Problem Relation Age of Onset   Hypertension Father    Colon cancer Neg Hx     SOCIAL HISTORY:   Social History   Tobacco Use   Smoking status: Every Day    Current packs/day: 0.25    Average packs/day: 0.3 packs/day for 12.0 years (3.0 ttl pk-yrs)    Types: Cigarettes   Smokeless tobacco: Never  Substance Use Topics   Alcohol use: Not Currently    Alcohol/week: 5.0 standard drinks of alcohol    Types: 2 Cans of beer, 3 Shots of liquor per  week    Comment: couple shots every 3 days.      ALLERGIES:   No Known Allergies   CURRENT MEDICATIONS:   Current Outpatient Medications  Medication Sig Dispense Refill   acetaminophen  (TYLENOL ) 325 MG tablet Take 2 tablets (650 mg total) by mouth every 6 (six) hours as needed.     allopurinol  (ZYLOPRIM ) 100 MG tablet Take 0.5 tablets (50 mg total) by mouth every other day. 23 tablet 0   amLODipine  (NORVASC ) 10 MG tablet Take 1 tablet (10 mg total) by mouth daily. 90 tablet 0   amoxicillin -clavulanate (AUGMENTIN ) 500-125 MG tablet Take 1 tablet by mouth every evening. 16 tablet 0   atorvastatin  (LIPITOR) 10 MG tablet Take 10 mg by mouth daily.     DULoxetine (CYMBALTA) 30 MG capsule Take 30 mg by mouth daily.     folic acid  (FOLVITE ) 1 MG tablet Take 1 tablet (1 mg total) by mouth daily. 30 tablet 0   gabapentin  (NEURONTIN ) 100 MG capsule Take 1 capsule (100 mg total) by mouth 2 (two) times daily. 60 capsule 0   pantoprazole  (PROTONIX ) 40 MG tablet Take 1 tablet (40 mg total) by mouth 2 (two) times daily before a meal. 180 tablet 0   polyethylene glycol powder (GLYCOLAX /MIRALAX ) 17 GM/SCOOP powder Dissolve 1 capful (17g) in 4-8 ounces of liquid and take by  mouth 2 (two) times daily as needed for moderate constipation or mild constipation. 238 g 2   senna-docusate (SENOKOT-S) 8.6-50 MG tablet Take 1-2 tablets by mouth 2 (two) times daily between meals as needed for mild constipation or moderate constipation.     sevelamer  carbonate (RENVELA ) 800 MG tablet Take 1 tablet (800 mg total) by mouth 3 (three) times daily with meals. 90 tablet 0   sildenafil  (REVATIO ) 20 MG tablet Take 1-5 tablets as needed prior to sexual activity 45 tablet 11   thiamine  (VITAMIN B1) 100 MG tablet Take 1 tablet (100 mg total) by mouth daily. 30 tablet 0   No current facility-administered medications for this visit.    REVIEW OF SYSTEMS:   [X]  denotes positive finding, [ ]  denotes negative finding Cardiac   Comments:  Chest pain or chest pressure:    Shortness of breath upon exertion:    Short of breath when lying flat:    Irregular heart rhythm:        Vascular    Pain in calf, thigh, or hip brought on by ambulation:    Pain in feet at night that wakes you up from your sleep:     Blood clot in your veins:    Leg swelling:         Pulmonary    Oxygen at home:    Productive cough:     Wheezing:         Neurologic    Sudden weakness in arms or legs:     Sudden numbness in arms or legs:     Sudden onset of difficulty speaking or slurred speech:    Temporary loss of vision in one eye:     Problems with dizziness:         Gastrointestinal    Blood in stool:     Vomited blood:         Genitourinary    Burning when urinating:     Blood in urine:        Psychiatric    Major depression:         Hematologic    Bleeding problems:    Problems with blood clotting too easily:        Skin    Rashes or ulcers:        Constitutional    Fever or chills:      PHYSICAL EXAM:   Vitals:   07/17/24 1125  BP: (!) 149/109  Pulse: 82  Temp: 98.1 F (36.7 C)  SpO2: 96%  Weight: 168 lb (76.2 kg)  Height: 5' 7 (1.702 m)    GENERAL: The patient is a well-nourished male, in no acute distress. The vital signs are documented above. CARDIAC: There is a regular rate and rhythm.  VASCULAR: Thrombosed right brachiocephalic fistula PULMONARY: Non-labored respirations ABDOMEN: Incisions have healed nicely MUSCULOSKELETAL: There are no major deformities or cyanosis. NEUROLOGIC: No focal weakness or paresthesias are detected. SKIN: There are no ulcers or rashes noted. PSYCHIATRIC: The patient has a normal affect.  STUDIES:   Right Basilic    Diameter (cm)Depth (cm)Findings   +-----------------+-------------+----------+---------+  Prox upper arm    0.55 / 0.48           branching  +-----------------+-------------+----------+---------+  Mid upper arm        0.56                           +-----------------+-------------+----------+---------+  Dist  upper arm       0.32                          +-----------------+-------------+----------+---------+  Antecubital fossa    0.39                          +-----------------+-------------+----------+---------+  Prox forearm         0.20                          +----   MEDICAL ISSUES:   ESRD: I had a lengthy discussion with the patient regarding his options for dialysis.  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  Unfortunately his catheter got infected.  I would not recommend replacing this catheter for a while.  We talked about repeat right arm access.  It looks like he has an adequate basilic vein.  I talked about doing this in 2 separate procedures.  If the vein does not look adequate I would place a  graft.  We also talked extensively about transplant.  He will contact me to schedule his procedure    Malvina New, IV, MD, FACS [facility] [phone] Pager [phone]

## 2024-07-18 ENCOUNTER — Emergency Department (HOSPITAL_COMMUNITY)
Admission: EM | Admit: 2024-07-18 | Discharge: 2024-07-18 | Disposition: A | Discharge status: Home / Self Care | Attending: Emergency Medicine | Admitting: Emergency Medicine

## 2024-07-18 ENCOUNTER — Other Ambulatory Visit: Payer: Self-pay

## 2024-07-18 ENCOUNTER — Encounter (HOSPITAL_COMMUNITY): Payer: Self-pay

## 2024-07-18 ENCOUNTER — Emergency Department (HOSPITAL_COMMUNITY)

## 2024-07-18 DIAGNOSIS — N186 End stage renal disease: Secondary | ICD-10-CM | POA: Diagnosis not present

## 2024-07-18 DIAGNOSIS — Z8673 Personal history of transient ischemic attack (TIA), and cerebral infarction without residual deficits: Secondary | ICD-10-CM

## 2024-07-18 DIAGNOSIS — I679 Cerebrovascular disease, unspecified: Secondary | ICD-10-CM | POA: Insufficient documentation

## 2024-07-18 DIAGNOSIS — Z992 Dependence on renal dialysis: Secondary | ICD-10-CM | POA: Insufficient documentation

## 2024-07-18 DIAGNOSIS — I120 Hypertensive chronic kidney disease with stage 5 chronic kidney disease or end stage renal disease: Secondary | ICD-10-CM | POA: Insufficient documentation

## 2024-07-18 DIAGNOSIS — R410 Disorientation, unspecified: Secondary | ICD-10-CM | POA: Diagnosis present

## 2024-07-18 DIAGNOSIS — I10 Essential (primary) hypertension: Secondary | ICD-10-CM

## 2024-07-18 LAB — TROPONIN T, HIGH SENSITIVITY
Troponin T High Sensitivity: 116 ng/L (ref 0–19)
Troponin T High Sensitivity: 109 ng/L (ref 0–19)

## 2024-07-18 LAB — CBC
HCT: 36.1 % — ABNORMAL LOW (ref 39.0–52.0)
Hemoglobin: 11.6 g/dL — ABNORMAL LOW (ref 13.0–17.0)
MCH: 31.1 pg (ref 26.0–34.0)
MCHC: 32.1 g/dL (ref 30.0–36.0)
MCV: 96.8 fL (ref 80.0–100.0)
Platelets: 187 K/uL (ref 150–400)
RBC: 3.73 MIL/uL — ABNORMAL LOW (ref 4.22–5.81)
RDW: 14.1 % (ref 11.5–15.5)
WBC: 6.4 K/uL (ref 4.0–10.5)
nRBC: 0 % (ref 0.0–0.2)

## 2024-07-18 LAB — BASIC METABOLIC PANEL WITH GFR
Anion gap: 12 (ref 5–15)
BUN: 29 mg/dL — ABNORMAL HIGH (ref 8–23)
CO2: 23 mmol/L (ref 22–32)
Calcium: 9.5 mg/dL (ref 8.9–10.3)
Chloride: 102 mmol/L (ref 98–111)
Creatinine, Ser: 5.46 mg/dL — ABNORMAL HIGH (ref 0.61–1.24)
GFR, Estimated: 11 mL/min — ABNORMAL LOW (ref >60)
Glucose, Bld: 89 mg/dL (ref 70–99)
Potassium: 4.3 mmol/L (ref 3.5–5.1)
Sodium: 137 mmol/L (ref 135–145)

## 2024-07-18 MED ORDER — METOPROLOL TARTRATE 50 MG PO TABS
50.0000 mg | ORAL_TABLET | Freq: Once | ORAL | Status: AC
  Administered 2024-07-18: 50 mg via ORAL
  Filled 2024-07-18: qty 1

## 2024-07-18 MED ORDER — NIFEDIPINE ER OSMOTIC RELEASE 30 MG PO TB24
30.0000 mg | ORAL_TABLET | Freq: Once | ORAL | Status: AC
  Administered 2024-07-18: 30 mg via ORAL
  Filled 2024-07-18: qty 1

## 2024-07-18 NOTE — ED Notes (Signed)
 ED Provider at bedside.

## 2024-07-18 NOTE — ED Notes (Signed)
 Date and time results received: 07/18/24 13282 (use smartphrase .now to insert current time)  Test: Trop Critical Value: 116  Name of Provider Notified: Dean

## 2024-07-18 NOTE — ED Provider Notes (Signed)
 Joshua Maldonado Provider Note   CSN: [id number] Arrival date & time: 07/18/24  1217     Patient presents with: Hypertension   Joshua Maldonado is a 68 y.o. male.   Pt is a 68 yo male with pmhx significant for ESRD on HD, HTN, Hep C, Cirrhosis, hx etoh abuse, GERD, sleep apnea, and SVT.  Pt presents to the ED today from dialysis due to HTN.  Pt said he did not take his bp meds this am because he was told not to take them until after dialysis.  He denies sob or cp.  He did not get dialysis today and wants to get it.  I did call the dialysis [facility] and they can[facility] fit him in today, but did say he was confused earlier.         Prior to Admission medications   Medication Sig Start Date End Date Taking? Authorizing Provider  acetaminophen  (TYLENOL ) 325 MG tablet Take 2 tablets (650 mg total) by mouth every 6 (six) hours as needed. Patient taking differently: Take 650 mg by mouth every 6 (six) hours as needed for mild pain (pain score 1-3). 06/09/24  Yes Gonfa, Mignon DASEN, MD  allopurinol  (ZYLOPRIM ) 100 MG tablet Take 0.5 tablets (50 mg total) by mouth every other day. Patient taking differently: Take 100 mg by mouth every other day. 06/09/24  Yes Gonfa, Taye T, MD  ammonium lactate (LAC-HYDRIN) 12 % lotion Apply 1 Application topically as needed for dry skin. 07/03/24  Yes [provider]  atorvastatin  (LIPITOR) 20 MG tablet Take 20 mg by mouth daily.   Yes [provider]  docusate sodium  (COLACE) 100 MG capsule Take 100 mg by mouth 2 (two) times daily.   Yes [provider]  fluticasone (FLONASE) 50 MCG/ACT nasal spray Place 1 spray into both nostrils daily.   Yes [provider]  folic acid  (FOLVITE ) 1 MG tablet Take 1 tablet (1 mg total) by mouth daily. 06/09/24  Yes Gonfa, Taye T, MD  furosemide  (LASIX ) 40 MG tablet Take 40 mg by mouth 2 (two) times daily. 07/03/24  Yes [provider]  lidocaine  (XYLOCAINE ) 2 %  solution Use as directed 15 mLs in the mouth or throat every 6 (six) hours as needed for mouth pain. 07/03/24  Yes [provider]  metoprolol  tartrate (LOPRESSOR ) 50 MG tablet Take 50 mg by mouth 2 (two) times daily.   Yes [provider]  NIFEdipine (ADALAT CC) 30 MG 24 hr tablet Take 30 mg by mouth daily.   Yes [provider]  sevelamer  carbonate (RENVELA ) 800 MG tablet Take 1 tablet (800 mg total) by mouth 3 (three) times daily with meals. 06/09/24  Yes Gonfa, Taye T, MD  sildenafil  (REVATIO ) 20 MG tablet Take 1-5 tablets as needed prior to sexual activity Patient taking differently: Take 20-100 mg by mouth daily as needed (erectile dysfuncton). Take 1-5 tablets as needed prior to sexual activity 09/20/23  Yes Joshua Cough, MD  thiamine  (VITAMIN B1) 100 MG tablet Take 1 tablet (100 mg total) by mouth daily. 06/09/24  Yes Gonfa, Taye T, MD  amoxicillin -clavulanate (AUGMENTIN ) 875-125 MG tablet Take 1 tablet by mouth 2 (two) times daily. Patient not taking: Reported on 07/18/2024 07/03/24   [provider]    Allergies: Patient has no known allergies.    Review of Systems  Cardiovascular:        Elevated bp  All other systems reviewed and are  negative.   Updated Vital Signs BP (!) 174/112   Pulse 68   Temp 98.5 F (36.9 C) (Oral)   Resp 12   Ht 5' 7 (1.702 m)   Wt 76.2 kg   SpO2 97%   BMI 26.31 kg/m   Physical Exam Vitals and nursing note reviewed.  Constitutional:      Appearance: Normal appearance.  HENT:     Head: Normocephalic and atraumatic.     Right Ear: External ear normal.     Left Ear: External ear normal.     Nose: Nose normal.     Mouth/Throat:     Mouth: Mucous membranes are moist.     Pharynx: Oropharynx is clear.  Eyes:     Extraocular Movements: Extraocular movements intact.     Conjunctiva/sclera: Conjunctivae normal.     Pupils: Pupils are equal, round, and reactive to light.  Cardiovascular:     Rate and Rhythm:  Normal rate and regular rhythm.     Pulses: Normal pulses.     Heart sounds: Normal heart sounds.  Pulmonary:     Effort: Pulmonary effort is normal.     Breath sounds: Normal breath sounds.  Abdominal:     General: Abdomen is flat. Bowel sounds are normal.     Palpations: Abdomen is soft.  Musculoskeletal:        General: Normal range of motion.     Cervical back: Normal range of motion and neck supple.  Skin:    General: Skin is warm.     Capillary Refill: Capillary refill takes less than 2 seconds.  Neurological:     General: No focal deficit present.     Mental Status: He is alert and oriented to person, place, and time.  Psychiatric:        Mood and Affect: Mood normal.        Behavior: Behavior normal.     (all labs ordered are listed, but only abnormal results are displayed) Labs Reviewed  BASIC METABOLIC PANEL WITH GFR - Abnormal; Notable for the following components:      Result Value   BUN 29 (*)    Creatinine, Ser 5.46 (*)    GFR, Estimated 11 (*)    All other components within normal limits  CBC - Abnormal; Notable for the following components:   RBC 3.73 (*)    Hemoglobin 11.6 (*)    HCT 36.1 (*)    All other components within normal limits  TROPONIN [facility], HIGH SENSITIVITY - Abnormal; Notable for the following components:   Troponin [facility] High Sensitivity 116 (*)    All other components within normal limits  TROPONIN [facility], HIGH SENSITIVITY - Abnormal; Notable for the following components:   Troponin [facility] High Sensitivity 109 (*)    All other components within normal limits    EKG: EKG Interpretation Date/Time:  Tuesday July 18 2024 12:39:58 EDT Ventricular Rate:  81 PR Interval:  154 QRS Duration:  97 QT Interval:  386 QTC Calculation: 448 R Axis:   -18  Text Interpretation: Sinus rhythm Atrial premature complexes Borderline left axis deviation Abnormal R-wave progression, late transition NO SIGNIFICANT CHANGE SINCE LAST TRACING YESTERDAY Confirmed by  Dean Clarity [phone]) on 07/18/2024 1:03:14 PM  Radiology: CT Head Wo Contrast Result Date: 07/18/2024 EXAM: CT HEAD WITHOUT CONTRAST 07/18/2024 03:06:15 PM TECHNIQUE: CT of the head was performed without the administration of intravenous contrast. Automated exposure control, iterative reconstruction, and/or weight based adjustment of the mA/kV was utilized  to reduce the radiation dose to as low as reasonably achievable. COMPARISON: None available. CLINICAL HISTORY: Mental status change, unknown cause. Pt arrived via [facility] from dialysis for evaluation of hypertension and tachycardia. FINDINGS: BRAIN AND VENTRICLES: There is no evidence of an acute cortically based infarct, intracranial hemorrhage, mass, midline shift, hydrocephalus, or extra-axial fluid collection. Cerebral volume is within normal limits for age. The ventricles are normal in size. Patchy cerebral white matter hypodensities are nonspecific but compatible with moderate chronic small vessel ischemic disease. There are age indeterminate lacunar infarcts in the basal ganglia/internal capsule regions bilaterally and in the left thalamus. Calcified atherosclerosis at the skull base. ORBITS: No acute abnormality. SINUSES: Partially visualized mucosal thickening and postoperative changes in the paranasal sinuses. Clear mastoid air cells. SOFT TISSUES AND SKULL: No acute soft tissue abnormality. No skull fracture. IMPRESSION: 1. No acute large territory infarct or intracranial hemorrhage. 2. Age-indeterminate lacunar infarcts in the bilateral basal ganglia regions and left thalamus. 3. Moderate chronic small vessel ischemic disease. Electronically signed by: Dasie Hamburg MD 07/18/2024 03:16 PM EDT RP Workstation: [id number]   DG Chest Port 1 View Result Date: 07/18/2024 CLINICAL DATA:  Chest pain. EXAM: PORTABLE CHEST 1 VIEW COMPARISON:  June 03, 2024 FINDINGS: A right-sided venous catheter is seen with its distal tip noted within the distal aspect  of the superior vena cava. The heart size and mediastinal contours are within normal limits. There are low lung volumes without evidence of acute infiltrate, pleural effusion or pneumothorax. Multilevel degenerative changes are seen throughout the thoracic spine. IMPRESSION: Low lung volumes without evidence of acute or active cardiopulmonary disease. Electronically Signed   By: Suzen Dials M.D.   On: 07/18/2024 13:37     Procedures   Medications Ordered in the ED  metoprolol  tartrate (LOPRESSOR ) tablet 50 mg (50 mg Oral Given 07/18/24 1254)  NIFEdipine (PROCARDIA-XL/NIFEDICAL-XL) 24 hr tablet 30 mg (30 mg Oral Given 07/18/24 1254)                                    Medical Decision Making Amount and/or Complexity of Data Reviewed Labs: ordered. Radiology: ordered.  Risk Prescription drug management.   This patient presents to the ED for concern of htn, this involves an extensive number of treatment options, and is a complaint that carries with it a high risk of complications and morbidity.  The differential diagnosis includes cardiac event, head bleed, electrolyte abn   Co morbidities that complicate the patient evaluation  ESRD on HD, HTN, Hep C, Cirrhosis, hx etoh abuse, GERD, sleep apnea, and SVT   Additional history obtained:  Additional history obtained from epic chart review External records from outside source obtained and reviewed including EMS report   Lab Tests:  I Ordered, and personally interpreted labs.  The pertinent results include:  cbc with hgb 11.6 (stable); bmp with nl k, bun cr 29 and 5.46 (stable); trop elevated at 116   Imaging Studies ordered:  I ordered imaging studies including cxr, ct head  I independently visualized and interpreted imaging which showed  CXR: Low lung volumes without evidence of acute or active cardiopulmonary  disease.  CT head: No acute large territory infarct or intracranial hemorrhage.  2. Age-indeterminate  lacunar infarcts in the bilateral basal ganglia regions  and left thalamus.  3. Moderate chronic small vessel ischemic disease.   I agree with the radiologist interpretation   Cardiac Monitoring:  The patient was maintained on a cardiac monitor.  I personally viewed and interpreted the cardiac monitored which showed an underlying rhythm of: nsr   Medicines ordered and prescription drug management:  I ordered medication including metoprolol  and nifedipine  for htn  Reevaluation of the patient after these medicines showed that the patient improved I have reviewed the patients home medicines and have made adjustments as needed   Test Considered:  ct   Critical Interventions:  Bp meds   Consultations Obtained:  I requested consultation with the nephrologist (Dr. Marlee),  and discussed lab and imaging findings as well as pertinent plan - he recommended trying to get pt back to dialysis today and if not today then tomorrow.   Problem List / ED Course:  HTN:  chronic.  Pt has just recently switched to metoprolol  and nifedipime (I called pharmacy to verify).  His meds were given to him today. ESRD on HD:  pt's dialysis [facility] unable to fit him in today.  He is switching dialysis centers as of tomorrow, so he's unable to go back to his current [facility] tomorrow. Prior cva on CT:  pt unaware of prior cvas.  He is instructed to f/u with outpatient neuro. Elevated trop:  flat.  No cp.   Reevaluation:  After the interventions noted above, I reevaluated the patient and found that they have :improved   Social Determinants of Health:  Lives at home   Dispostion:  After consideration of the diagnostic results and the patients response to treatment, I feel that the patent would benefit from discharge with outpatient f/u.       Final diagnoses:  Hypertension, unspecified type  ESRD on hemodialysis (HCC)  Chronic cerebrovascular accident (CVA)    ED Discharge Orders           Ordered    Ambulatory referral to Neurology       Comments: An appointment is requested in approximately: 1 week   07/18/24 1539               Dean Clarity, MD 07/18/24 1539

## 2024-07-18 NOTE — ED Triage Notes (Signed)
 Pt arrived via [facility] from dialysis for evaluation of hypertension and tachycardia. Pt does not have a restricted extremity. Pt denies SOB, denies CP.

## 2024-07-20 ENCOUNTER — Encounter: Payer: Self-pay | Admitting: Gastroenterology

## 2024-07-24 ENCOUNTER — Encounter: Admitting: Surgery

## 2024-07-27 ENCOUNTER — Other Ambulatory Visit (HOSPITAL_COMMUNITY): Payer: Self-pay | Admitting: Nurse Practitioner

## 2024-07-27 ENCOUNTER — Ambulatory Visit (HOSPITAL_COMMUNITY)
Admission: RE | Admit: 2024-07-27 | Discharge: 2024-07-27 | Disposition: A | Discharge status: Home / Self Care | Source: Ambulatory Visit | Attending: Nurse Practitioner | Admitting: Nurse Practitioner

## 2024-07-27 DIAGNOSIS — M542 Cervicalgia: Secondary | ICD-10-CM | POA: Diagnosis present

## 2024-07-27 DIAGNOSIS — M545 Low back pain, unspecified: Secondary | ICD-10-CM | POA: Insufficient documentation

## 2024-08-11 ENCOUNTER — Telehealth: Payer: Self-pay

## 2024-08-11 NOTE — Telephone Encounter (Signed)
 Called pt x2 to schedule surgery as recommended by [facility]. Unable to lvm.

## 2024-08-14 NOTE — Telephone Encounter (Signed)
 Called pt regarding getting set up for surgery . Right arm AVG placement . Pts would like to call back to schedule

## 2024-08-23 ENCOUNTER — Ambulatory Visit: Admitting: Gastroenterology

## 2024-08-23 ENCOUNTER — Encounter: Payer: Self-pay | Admitting: *Deleted

## 2024-08-23 ENCOUNTER — Encounter: Payer: Self-pay | Admitting: Gastroenterology

## 2024-08-23 ENCOUNTER — Telehealth: Payer: Self-pay | Admitting: *Deleted

## 2024-08-23 ENCOUNTER — Other Ambulatory Visit: Payer: Self-pay | Admitting: *Deleted

## 2024-08-23 VITALS — BP 129/83 | HR 90 | Temp 98.2°F | Ht 67.0 in | Wt 173.6 lb

## 2024-08-23 DIAGNOSIS — Z8619 Personal history of other infectious and parasitic diseases: Secondary | ICD-10-CM | POA: Diagnosis not present

## 2024-08-23 DIAGNOSIS — K7460 Unspecified cirrhosis of liver: Secondary | ICD-10-CM

## 2024-08-23 DIAGNOSIS — Z1211 Encounter for screening for malignant neoplasm of colon: Secondary | ICD-10-CM

## 2024-08-23 DIAGNOSIS — R630 Anorexia: Secondary | ICD-10-CM

## 2024-08-23 MED ORDER — LINACLOTIDE 290 MCG PO CAPS: 290.0000 ug | ORAL_CAPSULE | Freq: Every day | ORAL | Status: AC

## 2024-08-23 MED ORDER — PEG 3350-KCL-NA BICARB-NACL 420 G PO SOLR: 4000.0000 mL | Freq: Once | ORAL | 0 refills | Status: AC

## 2024-08-23 NOTE — Progress Notes (Signed)
 Medication Samples have been provided to the patient.  Drug name: Linzess       Strength: 290 mcg        Qty: 1  LOT: [id number]  Exp.Date: 08/04/2025  Dosing instructions: as directed  The patient has been instructed regarding the correct time, dose, and frequency of taking this medication, including desired effects and most common side effects.   Madelin HERO Vencil Basnett 10:20 AM 08/23/2024
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
## 2024-08-23 NOTE — Telephone Encounter (Signed)
 [employer] PA: Notification or Prior Authorization is not required for the requested services You are not required to submit a notification/prior authorization based on the information provided. If you have general questions about the prior authorization requirements, visit [email or web address] > Clinician Resources > Advance and Admission Notification Requirements. The number above acknowledges your notification. Please write this reference number down for future reference. If you would like to request an organization determination, please call us  at [phone]. Decision ID #: [id number]

## 2024-08-23 NOTE — Patient Instructions (Signed)
 Limit Tylenol  (acetaminophen ) from all sources (including your pain medication) to no more than 500mg  at one time and nor more than 2000mg  per 24 hours. Colonoscopy and upper endoscopy to be scheduled. Samples of Linzess provided today to go with your primary bowel prep for colonoscopy. See your instructions to be provided.  Complete labs at [facility] [address], [place].

## 2024-08-24 ENCOUNTER — Other Ambulatory Visit (HOSPITAL_COMMUNITY): Payer: Self-pay | Admitting: Neurosurgery

## 2024-08-24 DIAGNOSIS — M961 Postlaminectomy syndrome, not elsewhere classified: Secondary | ICD-10-CM

## 2024-08-24 LAB — HCV RNA QUANT

## 2024-08-25 ENCOUNTER — Ambulatory Visit: Payer: Self-pay | Admitting: Gastroenterology

## 2024-08-25 ENCOUNTER — Encounter: Payer: Self-pay | Admitting: *Deleted

## 2024-08-25 LAB — COMPREHENSIVE METABOLIC PANEL WITH GFR
ALT: 12 IU/L (ref 0–44)
AST: 17 IU/L (ref 0–40)
Albumin: 4.4 g/dL (ref 3.9–4.9)
Alkaline Phosphatase: 74 IU/L (ref 47–123)
BUN/Creatinine Ratio: 5 — ABNORMAL LOW (ref 10–24)
BUN: 22 mg/dL (ref 8–27)
Bilirubin Total: 0.6 mg/dL (ref 0.0–1.2)
CO2: 24 mmol/L (ref 20–29)
Calcium: 9.4 mg/dL (ref 8.6–10.2)
Chloride: 96 mmol/L (ref 96–106)
Creatinine, Ser: 4.56 mg/dL — ABNORMAL HIGH (ref 0.76–1.27)
Globulin, Total: 4.3 g/dL (ref 1.5–4.5)
Glucose: 119 mg/dL — ABNORMAL HIGH (ref 70–99)
Potassium: 5.1 mmol/L (ref 3.5–5.2)
Sodium: 138 mmol/L (ref 134–144)
Total Protein: 8.7 g/dL — ABNORMAL HIGH (ref 6.0–8.5)
eGFR: 13 mL/min/1.73 — ABNORMAL LOW (ref >59)

## 2024-08-25 LAB — CBC WITH DIFFERENTIAL/PLATELET
Basophils Absolute: 0 x10E3/uL (ref 0.0–0.2)
Basos: 1 %
EOS (ABSOLUTE): 0.2 x10E3/uL (ref 0.0–0.4)
Eos: 5 %
Hematocrit: 41.5 % (ref 37.5–51.0)
Hemoglobin: 13.4 g/dL (ref 13.0–17.7)
Immature Grans (Abs): 0 x10E3/uL (ref 0.0–0.1)
Immature Granulocytes: 0 %
Lymphocytes Absolute: 1.1 x10E3/uL (ref 0.7–3.1)
Lymphs: 24 %
MCH: 30.9 pg (ref 26.6–33.0)
MCHC: 32.3 g/dL (ref 31.5–35.7)
MCV: 96 fL (ref 79–97)
Monocytes Absolute: 0.3 x10E3/uL (ref 0.1–0.9)
Monocytes: 7 %
Neutrophils Absolute: 3.1 x10E3/uL (ref 1.4–7.0)
Neutrophils: 63 %
Platelets: 170 x10E3/uL (ref 150–450)
RBC: 4.34 x10E6/uL (ref 4.14–5.80)
RDW: 13.7 % (ref 11.6–15.4)
WBC: 4.8 x10E3/uL (ref 3.4–10.8)

## 2024-08-25 LAB — HCV RNA QUANT: Hepatitis C Quantitation: NOT DETECTED [IU]/mL

## 2024-08-25 LAB — PROTIME-INR
INR: 1 (ref 0.9–1.2)
Prothrombin Time: 10.9 s (ref 9.1–12.0)

## 2024-08-25 LAB — AFP TUMOR MARKER: AFP, Serum, Tumor Marker: 4.7 ng/mL (ref 0.0–8.4)

## 2024-08-27 ENCOUNTER — Ambulatory Visit (HOSPITAL_COMMUNITY)
Admission: RE | Admit: 2024-08-27 | Discharge: 2024-08-27 | Disposition: A | Discharge status: Home / Self Care | Source: Ambulatory Visit | Attending: Neurosurgery | Admitting: Neurosurgery

## 2024-08-27 DIAGNOSIS — Z981 Arthrodesis status: Secondary | ICD-10-CM

## 2024-08-27 DIAGNOSIS — M48061 Spinal stenosis, lumbar region without neurogenic claudication: Secondary | ICD-10-CM

## 2024-08-27 DIAGNOSIS — M961 Postlaminectomy syndrome, not elsewhere classified: Secondary | ICD-10-CM | POA: Insufficient documentation

## 2024-08-27 DIAGNOSIS — M51369 Other intervertebral disc degeneration, lumbar region without mention of lumbar back pain or lower extremity pain: Secondary | ICD-10-CM

## 2024-08-27 DIAGNOSIS — M47816 Spondylosis without myelopathy or radiculopathy, lumbar region: Secondary | ICD-10-CM

## 2024-09-08 ENCOUNTER — Other Ambulatory Visit: Payer: Self-pay

## 2024-09-08 ENCOUNTER — Encounter (HOSPITAL_COMMUNITY)
Admission: RE | Admit: 2024-09-08 | Discharge: 2024-09-08 | Disposition: A | Source: Ambulatory Visit | Attending: Internal Medicine

## 2024-09-08 ENCOUNTER — Encounter (HOSPITAL_COMMUNITY): Payer: Self-pay

## 2024-09-08 VITALS — Ht 67.0 in | Wt 173.6 lb

## 2024-09-08 DIAGNOSIS — N186 End stage renal disease: Secondary | ICD-10-CM

## 2024-09-08 HISTORY — DX: End stage renal disease: N18.6

## 2024-09-08 HISTORY — DX: Dependence on renal dialysis: Z99.2

## 2024-09-08 NOTE — Pre-Procedure Instructions (Signed)
 Pre-op  phone call done. Patient states he did not receive prep information. I printed this for him and left it at the main desk for him to pick up.

## 2024-09-12 ENCOUNTER — Telehealth: Payer: Self-pay

## 2024-09-12 NOTE — Telephone Encounter (Signed)
 Patient rec'd multiple telephone calls re: scheduling surgery for R arm AVF vs AVG with Dr. Serene.  Patient has not attempted contact with office.  Letter sent to patient's address.  Surgical Order sheet sent to scanning.

## 2024-09-13 ENCOUNTER — Ambulatory Visit (HOSPITAL_COMMUNITY): Admitting: Anesthesiology

## 2024-09-13 ENCOUNTER — Other Ambulatory Visit: Payer: Self-pay

## 2024-09-13 ENCOUNTER — Ambulatory Visit (HOSPITAL_COMMUNITY)
Admission: RE | Admit: 2024-09-13 | Discharge: 2024-09-13 | Disposition: A | Attending: Internal Medicine | Admitting: Internal Medicine

## 2024-09-13 ENCOUNTER — Encounter (HOSPITAL_COMMUNITY): Payer: Self-pay | Admitting: Internal Medicine

## 2024-09-13 ENCOUNTER — Encounter (HOSPITAL_COMMUNITY): Admission: RE | Disposition: A | Payer: Self-pay | Source: Home / Self Care | Attending: Internal Medicine

## 2024-09-13 DIAGNOSIS — K2980 Duodenitis without bleeding: Secondary | ICD-10-CM | POA: Diagnosis not present

## 2024-09-13 DIAGNOSIS — Z87891 Personal history of nicotine dependence: Secondary | ICD-10-CM

## 2024-09-13 DIAGNOSIS — K635 Polyp of colon: Secondary | ICD-10-CM | POA: Diagnosis not present

## 2024-09-13 DIAGNOSIS — I10 Essential (primary) hypertension: Secondary | ICD-10-CM | POA: Diagnosis not present

## 2024-09-13 DIAGNOSIS — N186 End stage renal disease: Secondary | ICD-10-CM

## 2024-09-13 HISTORY — PX: ESOPHAGOGASTRODUODENOSCOPY: SHX5428

## 2024-09-13 HISTORY — PX: COLONOSCOPY: SHX5424

## 2024-09-13 HISTORY — PX: POLYPECTOMY: SHX149

## 2024-09-13 LAB — POCT I-STAT, CHEM 8
BUN: 40 mg/dL — ABNORMAL HIGH (ref 8–23)
Calcium, Ion: 0.86 mmol/L — CL (ref 1.15–1.40)
Chloride: 109 mmol/L (ref 98–111)
Creatinine, Ser: 6.1 mg/dL — ABNORMAL HIGH (ref 0.61–1.24)
Glucose, Bld: 100 mg/dL — ABNORMAL HIGH (ref 70–99)
HCT: 40 % (ref 39.0–52.0)
Hemoglobin: 13.6 g/dL (ref 13.0–17.0)
Potassium: 5.6 mmol/L — ABNORMAL HIGH (ref 3.5–5.1)
Sodium: 137 mmol/L (ref 135–145)
TCO2: 24 mmol/L (ref 22–32)

## 2024-09-13 SURGERY — COLONOSCOPY
Anesthesia: Monitor Anesthesia Care

## 2024-09-13 MED ORDER — PROPOFOL 10 MG/ML IV BOLUS
INTRAVENOUS | Status: DC | PRN
  Administered 2024-09-13: 25 mg via INTRAVENOUS
  Administered 2024-09-13: 100 mg via INTRAVENOUS
  Administered 2024-09-13: 50 mg via INTRAVENOUS

## 2024-09-13 MED ORDER — PROPOFOL 500 MG/50ML IV EMUL
INTRAVENOUS | Status: DC | PRN
  Administered 2024-09-13: 150 ug/kg/min via INTRAVENOUS

## 2024-09-13 MED ORDER — LACTATED RINGERS IV SOLN: INTRAVENOUS | Status: DC

## 2024-09-13 MED ORDER — LIDOCAINE HCL 1 % IJ SOLN
INTRAMUSCULAR | Status: DC | PRN
  Administered 2024-09-13: 60 mg via INTRADERMAL

## 2024-09-13 MED ORDER — SODIUM CHLORIDE 0.9 % IV SOLN: INTRAVENOUS | Status: DC | PRN

## 2024-09-13 NOTE — Anesthesia Preprocedure Evaluation (Signed)
 Anesthesia Evaluation  Patient identified by MRN, date of birth, ID band Patient awake    Reviewed: Allergy & Precautions, H&P , NPO status , Patient's Chart, lab work & pertinent test results, reviewed documented beta blocker date and time   Airway Mallampati: II  TM Distance: >3 FB Neck ROM: full    Dental no notable dental hx.    Pulmonary sleep apnea , former smoker   Pulmonary exam normal breath sounds clear to auscultation       Cardiovascular Exercise Tolerance: Good hypertension,  Rhythm:regular Rate:Normal     Neuro/Psych  PSYCHIATRIC DISORDERS      negative neurological ROS     GI/Hepatic ,GERD  ,,(+) Cirrhosis       , Hepatitis -, C  Endo/Other  negative endocrine ROS    Renal/GU Renal disease  negative genitourinary   Musculoskeletal   Abdominal   Peds  Hematology negative hematology ROS (+)   Anesthesia Other Findings   Reproductive/Obstetrics negative OB ROS                              Anesthesia Physical Anesthesia Plan  ASA: 3  Anesthesia Plan: MAC   Post-op Pain Management:    Induction:   PONV Risk Score and Plan: Propofol  infusion  Airway Management Planned:   Additional Equipment:   Intra-op Plan:   Post-operative Plan:   Informed Consent: I have reviewed the patients History and Physical, chart, labs and discussed the procedure including the risks, benefits and alternatives for the proposed anesthesia with the patient or authorized representative who has indicated his/her understanding and acceptance.     Dental Advisory Given  Plan Discussed with: CRNA  Anesthesia Plan Comments:         Anesthesia Quick Evaluation

## 2024-09-13 NOTE — Progress Notes (Signed)
 Lab result reported to Dr Kendell iCa level 0.86.

## 2024-09-13 NOTE — Op Note (Signed)
 [facility] Patient Name: Joshua Maldonado Procedure Date: 09/13/2024 11:42 AM MRN: [id number] Date of Birth: January 01, 1956 Attending MD: [name] , MD, [id number] CSN: [id number] Age: 68 Admit Type: Outpatient Procedure:                Colonoscopy Indications:              High risk colon cancer surveillance: Personal                            history of colonic polyps Providers:                [name], MD, Rosina Sprague, Bascom Blush Referring MD:              Medicines:                Propofol  per Anesthesia Complications:            No immediate complications. Estimated Blood Loss:     Estimated blood loss was minimal. Procedure:                Pre-Anesthesia Assessment:                           - Prior to the procedure, a History and Physical                            was performed, and patient medications and                            allergies were reviewed. The patient's tolerance of                            previous anesthesia was also reviewed. The risks                            and benefits of the procedure and the sedation                            options and risks were discussed with the patient.                            All questions were answered, and informed consent                            was obtained. Prior Anticoagulants: The patient has                            taken no anticoagulant or antiplatelet agents. ASA                            Grade Assessment: III - A patient with severe                            systemic disease. After reviewing the risks and  benefits, the patient was deemed in satisfactory                            condition to undergo the procedure.                           After obtaining informed consent, the colonoscope                            was passed under direct vision. Throughout the                            procedure, the patient's blood pressure, pulse, and                             oxygen saturations were monitored continuously. The                            [id number] ([id number]) Colon was introduced through                            the anus and advanced to the the cecum, identified                            by appendiceal orifice and ileocecal valve. The                            ileocecal valve, appendiceal orifice, and rectum                            were photographed. The entire colon was not                            examined. The quality of the bowel preparation was                            inadequate. Scope In: 12:59:46 PM Scope Out: 1:09:15 PM Scope Withdrawal Time: 0 hours 2 minutes 53 seconds  Total Procedure Duration: 0 hours 9 minutes 29 seconds  Findings:      The perianal and digital rectal examinations were normal.      A 5 mm polyp was found in the sigmoid colon. The polyp was sessile. The       polyp was removed with a cold snare. Resection and retrieval were       complete. Estimated blood loss was minimal. Preparation grossly       inadequate formed stool distributed throughout the colon which made the       exam incomplete could not see a good percentage of the colonic mucosa.       Cecum was obscured with stool. Procedure was complete but grossly in       adequate. Retroflexion view of the anal verge demonstrated only internal       hemorrhoids. Impression:               - Preparation of the colon was inadequate.                           -  One 5 mm polyp in the sigmoid colon, removed with                            a cold snare. Resected and retrieved.                           - Internal hemorrhoids. Moderate Sedation:      Moderate (conscious) sedation was personally administered by an       anesthesia professional. The following parameters were monitored: oxygen       saturation, heart rate, blood pressure, respiratory rate, EKG, adequacy       of pulmonary ventilation, and response to care. Recommendation:            - Patient has a contact number available for                            emergencies. The signs and symptoms of potential                            delayed complications were discussed with the                            patient. Return to normal activities tomorrow.                            Written discharge instructions were provided to the                            patient.                           - Advance diet as tolerated.                           - Continue present medications.                           - Repeat colonoscopy at next available appointment                            (within 3 months) because the bowel preparation was                            poor.                           - Return to GI office (date not yet determined).                            See EGD report. Procedure Code(s):        --- Professional ---                           [phone], Colonoscopy, flexible; with removal of  tumor(s), polyp(s), or other lesion(s) by snare                            technique Diagnosis Code(s):        --- Professional ---                           Z86.010, Personal history of colonic polyps                           D12.5, Benign neoplasm of sigmoid colon CPT copyright 2022 American Medical Association. All rights reserved. The codes documented in this report are preliminary and upon coder review may  be revised to meet current compliance requirements. Lamar HERO. Zianne Schubring, MD [name], MD 09/13/2024 1:24:26 PM This report has been signed electronically. Number of Addenda: 0

## 2024-09-13 NOTE — Transfer of Care (Signed)
 Immediate Anesthesia Transfer of Care Note  Patient: Joshua Maldonado  Procedure(s) Performed: COLONOSCOPY EGD (ESOPHAGOGASTRODUODENOSCOPY) POLYPECTOMY, INTESTINE  Patient Location: Short Stay  Anesthesia Type:General  Level of Consciousness: awake  Airway & Oxygen Therapy: Patient Spontanous Breathing  Post-op Assessment: Report given to RN  Post vital signs: Reviewed and stable  Last Vitals:  Vitals Value Taken Time  BP    Temp    Pulse    Resp    SpO2      Last Pain:  Vitals:   09/13/24 1243  TempSrc:   PainSc: 0-No pain         Complications: No notable events documented.

## 2024-09-13 NOTE — Op Note (Signed)
 [facility] Patient Name: Joshua Maldonado Procedure Date: 09/13/2024 11:46 AM MRN: [id number] Date of Birth: [birth year] Attending MD: [name] , MD, [id number] CSN: [id number] Age: 68 Admit Type: Outpatient Procedure:                Upper GI endoscopy Indications:              Dyspepsia Providers:                [name], MD, Rosina Sprague, Bascom Blush Referring MD:              Medicines:                Propofol  per Anesthesia Complications:            No immediate complications. Estimated Blood Loss:     Estimated blood loss was minimal. Procedure:                Pre-Anesthesia Assessment:                           - Prior to the procedure, a History and Physical                            was performed, and patient medications and                            allergies were reviewed. The patient's tolerance of                            previous anesthesia was also reviewed. The risks                            and benefits of the procedure and the sedation                            options and risks were discussed with the patient.                            All questions were answered, and informed consent                            was obtained. Prior Anticoagulants: The patient has                            taken no anticoagulant or antiplatelet agents. ASA                            Grade Assessment: III - A patient with severe                            systemic disease. After reviewing the risks and                            benefits, the patient was deemed in satisfactory  condition to undergo the procedure.                           After obtaining informed consent, the endoscope was                            passed under direct vision. Throughout the                            procedure, the patient's blood pressure, pulse, and                            oxygen saturations were monitored continuously. The                             [id number] ([id number]) Upper was introduced through                            the mouth, and advanced to the second part of                            duodenum. The upper GI endoscopy was accomplished                            with ease. The patient tolerated the procedure well. Scope In: 12:49:32 PM Scope Out: 12:54:07 PM Total Procedure Duration: 0 hours 4 minutes 35 seconds  Findings:      The examined esophagus was normal.      Moderate portal hypertensive gastropathy was found in the entire       examined stomach. Touch friability. Scattered associated erosions. No       ulcers or varices seen patent pylorus. No infiltrating process.       Scattered bulbar erosions otherwise, D1-D2 appeared normal      Biopsies of the abnormal gastric mucosa taken for histologic study Impression:               - Normal esophagus.                           - Portal hypertensive gastropathy. Touch mucosal                            friability.Status post biopsy. Bulbar erosions Moderate Sedation:      Moderate (conscious) sedation was personally administered by an       anesthesia professional. The following parameters were monitored: oxygen       saturation, heart rate, blood pressure, respiratory rate, EKG, adequacy       of pulmonary ventilation, and response to care. Recommendation:           - Patient has a contact number available for                            emergencies. The signs and symptoms of potential                            delayed complications were discussed  with the                            patient. Return to normal activities tomorrow.                            Written discharge instructions were provided to the                            patient.                           - Advance diet as tolerated. Follow-up on                            pathology. See colonoscopy report.                           - Continue present medications.                            - Return to my office in 6 weeks. Procedure Code(s):        --- Professional ---                           [phone], Esophagogastroduodenoscopy, flexible,                            transoral; diagnostic, including collection of                            specimen(s) by brushing or washing, when performed                            (separate procedure) Diagnosis Code(s):        --- Professional ---                           K76.6, Portal hypertension                           K31.89, Other diseases of stomach and duodenum                           R10.13, Epigastric pain CPT copyright 2022 American Medical Association. All rights reserved. The codes documented in this report are preliminary and upon coder review may  be revised to meet current compliance requirements. [name], MD [name], MD 09/13/2024 1:15:49 PM This report has been signed electronically. Number of Addenda: 0

## 2024-09-13 NOTE — Discharge Instructions (Addendum)
 EGD Discharge instructions Please read the instructions outlined below and refer to this sheet in the next few weeks. These discharge instructions provide you with general information on caring for yourself after you leave the hospital. Your doctor may also give you specific instructions. While your treatment has been planned according to the most current medical practices available, unavoidable complications occasionally occur. If you have any problems or questions after discharge, please call your doctor. ACTIVITY You may resume your regular activity but move at a slower pace for the next 24 hours.  Take frequent rest periods for the next 24 hours.  Walking will help expel (get rid of) the air and reduce the bloated feeling in your abdomen.  No driving for 24 hours (because of the anesthesia (medicine) used during the test).  You may shower.  Do not sign any important legal documents or operate any machinery for 24 hours (because of the anesthesia used during the test).  NUTRITION Drink plenty of fluids.  You may resume your normal diet.  Begin with a light meal and progress to your normal diet.  Avoid alcoholic beverages for 24 hours or as instructed by your caregiver.  MEDICATIONS You may resume your normal medications unless your caregiver tells you otherwise.  WHAT YOU CAN EXPECT TODAY You may experience abdominal discomfort such as a feeling of fullness or gas pains.  FOLLOW-UP Your doctor will discuss the results of your test with you.  SEEK IMMEDIATE MEDICAL ATTENTION IF ANY OF THE FOLLOWING OCCUR: Excessive nausea (feeling sick to your stomach) and/or vomiting.  Severe abdominal pain and distention (swelling).  Trouble swallowing.  Temperature over 101 F (37.8 C).  Rectal bleeding or vomiting of blood.    Colonoscopy Discharge Instructions  Read the instructions outlined below and refer to this sheet in the next few weeks. These discharge instructions provide you with  general information on caring for yourself after you leave the hospital. Your doctor may also give you specific instructions. While your treatment has been planned according to the most current medical practices available, unavoidable complications occasionally occur. If you have any problems or questions after discharge, call Dr. Shaaron at [phone]. ACTIVITY You may resume your regular activity, but move at a slower pace for the next 24 hours.  Take frequent rest periods for the next 24 hours.  Walking will help get rid of the air and reduce the bloated feeling in your belly (abdomen).  No driving for 24 hours (because of the medicine (anesthesia) used during the test).   Do not sign any important legal documents or operate any machinery for 24 hours (because of the anesthesia used during the test).  NUTRITION Drink plenty of fluids.  You may resume your normal diet as instructed by your doctor.  Begin with a light meal and progress to your normal diet. Heavy or fried foods are harder to digest and may make you feel sick to your stomach (nauseated).  Avoid alcoholic beverages for 24 hours or as instructed.  MEDICATIONS You may resume your normal medications unless your doctor tells you otherwise.  WHAT YOU CAN EXPECT TODAY Some feelings of bloating in the abdomen.  Passage of more gas than usual.  Spotting of blood in your stool or on the toilet paper.  IF YOU HAD POLYPS REMOVED DURING THE COLONOSCOPY: No aspirin products for 7 days or as instructed.  No alcohol for 7 days or as instructed.  Eat a soft diet for the next 24 hours.  FINDING  OUT THE RESULTS OF YOUR TEST Not all test results are available during your visit. If your test results are not back during the visit, make an appointment with your caregiver to find out the results. Do not assume everything is normal if you have not heard from your caregiver or the medical facility. It is important for you to follow up on all of your test  results.  SEEK IMMEDIATE MEDICAL ATTENTION IF: You have more than a spotting of blood in your stool.  Your belly is swollen (abdominal distention).           Your stomach was inflamed.  Biopsies taken   1 small polyp in your colon -  removed.  However, your colon preparation was very poor most of your colon could not be seen.  You will need a  a repeat colonoscopy between now and the first of the year.  Further recommendations to follow pending review of pathology report   at patient request, I called Herny Scurlock (son) at [phone] to voicemail.  Left a detailed message. You are nauseated or vomiting.  You have a temperature over 101.  You have abdominal pain or discomfort that is severe or gets worse throughout the day.

## 2024-09-14 ENCOUNTER — Encounter (HOSPITAL_COMMUNITY): Payer: Self-pay | Admitting: Internal Medicine

## 2024-09-14 LAB — SURGICAL PATHOLOGY
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
## 2024-09-22 NOTE — Anesthesia Postprocedure Evaluation (Signed)
"   Anesthesia Post Note  Patient: [name]  Procedure(s) Performed: COLONOSCOPY EGD (ESOPHAGOGASTRODUODENOSCOPY) POLYPECTOMY, INTESTINE  Patient location during evaluation: Phase II Anesthesia Type: MAC Level of consciousness: awake Pain management: pain level controlled Vital Signs Assessment: post-procedure vital signs reviewed and stable Respiratory status: spontaneous breathing and respiratory function stable Cardiovascular status: blood pressure returned to baseline and stable Postop Assessment: no headache and no apparent nausea or vomiting Anesthetic complications: no Comments: Late entry   No notable events documented.   Last Vitals:  Vitals:   09/13/24 1146 09/13/24 1316  BP: 112/81 96/64  Pulse: 66 62  Resp: 13 14  Temp: 36.7 C 36.9 C  SpO2: 100% 92%    Last Pain:  Vitals:   09/14/24 1341  TempSrc:   PainSc: 0-No pain                 Yvonna PARAS Amanat Hackel      "

## 2024-09-27 ENCOUNTER — Ambulatory Visit: Payer: Self-pay | Admitting: Internal Medicine

## 2024-10-03 ENCOUNTER — Other Ambulatory Visit: Payer: Self-pay

## 2024-10-03 DIAGNOSIS — Z992 Dependence on renal dialysis: Secondary | ICD-10-CM

## 2024-10-06 ENCOUNTER — Ambulatory Visit (HOSPITAL_COMMUNITY)
Admission: RE | Admit: 2024-10-06 | Discharge: 2024-10-06 | Disposition: A | Discharge status: Home / Self Care | Attending: Vascular Surgery | Admitting: Vascular Surgery

## 2024-10-06 ENCOUNTER — Other Ambulatory Visit: Payer: Self-pay

## 2024-10-06 ENCOUNTER — Encounter (HOSPITAL_COMMUNITY): Payer: Self-pay | Admitting: Vascular Surgery

## 2024-10-06 ENCOUNTER — Ambulatory Visit (HOSPITAL_COMMUNITY)
Admission: RE | Disposition: A | Discharge status: Home / Self Care | Payer: Self-pay | Source: Home / Self Care | Attending: Vascular Surgery

## 2024-10-06 DIAGNOSIS — Z87891 Personal history of nicotine dependence: Secondary | ICD-10-CM | POA: Diagnosis not present

## 2024-10-06 DIAGNOSIS — Z79899 Other long term (current) drug therapy: Secondary | ICD-10-CM | POA: Diagnosis not present

## 2024-10-06 DIAGNOSIS — Z992 Dependence on renal dialysis: Secondary | ICD-10-CM | POA: Insufficient documentation

## 2024-10-06 DIAGNOSIS — Y839 Surgical procedure, unspecified as the cause of abnormal reaction of the patient, or of later complication, without mention of misadventure at the time of the procedure: Secondary | ICD-10-CM | POA: Insufficient documentation

## 2024-10-06 DIAGNOSIS — T8249XA Other complication of vascular dialysis catheter, initial encounter: Secondary | ICD-10-CM

## 2024-10-06 DIAGNOSIS — I120 Hypertensive chronic kidney disease with stage 5 chronic kidney disease or end stage renal disease: Secondary | ICD-10-CM | POA: Insufficient documentation

## 2024-10-06 DIAGNOSIS — N186 End stage renal disease: Secondary | ICD-10-CM | POA: Diagnosis not present

## 2024-10-06 DIAGNOSIS — T8241XA Breakdown (mechanical) of vascular dialysis catheter, initial encounter: Secondary | ICD-10-CM | POA: Insufficient documentation

## 2024-10-06 HISTORY — PX: TUNNELLED CATHETER EXCHANGE: CATH118373

## 2024-10-06 SURGERY — TUNNELLED CATHETER EXCHANGE
Anesthesia: LOCAL

## 2024-10-06 MED ORDER — SODIUM CHLORIDE 0.9 % IV SOLN: INTRAVENOUS | Status: DC

## 2024-10-06 MED ORDER — HEPARIN SODIUM (PORCINE) 1000 UNIT/ML IJ SOLN
INTRAMUSCULAR | Status: DC | PRN
  Administered 2024-10-06 (×2): 1900 [IU] via INTRAVENOUS

## 2024-10-06 MED ORDER — CHLORHEXIDINE GLUCONATE 4 % EX SOLN: 60.0000 mL | Freq: Once | CUTANEOUS | Status: DC

## 2024-10-06 MED ORDER — CEFAZOLIN SODIUM-DEXTROSE 2-4 GM/100ML-% IV SOLN: 2.0000 g | INTRAVENOUS | Status: DC

## 2024-10-06 MED ORDER — LIDOCAINE-EPINEPHRINE 1 %-1:100000 IJ SOLN
INTRAMUSCULAR | Status: AC
  Filled 2024-10-06: qty 20

## 2024-10-06 MED ORDER — LIDOCAINE HCL (PF) 1 % IJ SOLN
INTRAMUSCULAR | Status: AC
  Filled 2024-10-06: qty 30

## 2024-10-06 MED ORDER — LIDOCAINE-EPINEPHRINE 1 %-1:100000 IJ SOLN
INTRAMUSCULAR | Status: DC | PRN
  Administered 2024-10-06: 4 mL via INTRADERMAL
  Administered 2024-10-06: 1 mL via INTRADERMAL

## 2024-10-06 MED ORDER — HEPARIN SODIUM (PORCINE) 1000 UNIT/ML IJ SOLN
INTRAMUSCULAR | Status: AC
  Filled 2024-10-06: qty 10

## 2024-10-06 MED ORDER — HEPARIN (PORCINE) IN NACL 1000-0.9 UT/500ML-% IV SOLN
INTRAVENOUS | Status: DC | PRN
  Administered 2024-10-06: 500 mL

## 2024-10-06 SURGICAL SUPPLY — 3 items
CATH PALINDROME-P 23 W/VT (CATHETERS) IMPLANT
GLIDEWIRE ADV .035X180CM (WIRE) IMPLANT
TRAY PV CATH (CUSTOM PROCEDURE TRAY) ×1 IMPLANT

## 2024-10-06 NOTE — Op Note (Signed)
" ° ° °  NAME: [name]    MRN: [id number] DOB: Oct 20, 1955    DATE OF OPERATION: 10/06/2024  PREOP DIAGNOSIS:    Right internal jugular tunneled dialysis catheter malfunction  POSTOP DIAGNOSIS:    Same  PROCEDURE:    Right internal jugular dialysis catheter exchange-23 cm palindrome  SURGEON: Fonda FORBES Rim  ASSIST: None  ANESTHESIA: Local  EBL: 2 mL  INDICATIONS:    Joshua ROCKETT is a 69 y.o. male with history of end-stage renal disease currently dialyzed through a right-sided tunneled internal jugular vein 19 cm palindrome catheter.  The catheter has had difficulty functioning appropriately.  He presents today for exchange.  FINDINGS:   Catheter was sitting in the superior vena cava on imaging Replaced with a 23 cm tunneled palindrome catheter placed at the cavoatrial junction  TECHNIQUE:   Patient brought to the angiography suite laid in supine position.  He was prepped and draped in standard fashion and a timeout was performed.  The case began with local anesthetic injection surrounding the insertion site of the catheter and the felt bumper.  Once adequately anesthetized, a pair of Metzenbaum scissors was used to release the felt from surrounding tissue.  Next, a wire was run through the catheter into the inferior vena cava and the wire was removed using Seldinger technique.  A 23 cm tunneled palindrome catheter was brought onto the field and placed over the wire driven to the cavoatrial junction.  The wire was removed.  The catheter flushed appropriately.  It was sutured into place using 3-0 nylon suture and heparin  locked.  The catheter can be used for dialysis.   Fonda FORBES Rim, MD [facility] DATE OF DICTATION:   10/06/2024  "
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
## 2024-10-09 MED FILL — Lidocaine HCl Local Preservative Free (PF) Inj 1%: INTRAMUSCULAR | Qty: 30 | Status: AC

## 2024-10-17 ENCOUNTER — Telehealth: Payer: Self-pay

## 2024-10-17 NOTE — Telephone Encounter (Signed)
 LVM re: possiblity of changing surgery time/date to 1/14 - arrival 6:30am.

## 2024-10-20 ENCOUNTER — Encounter (HOSPITAL_COMMUNITY): Payer: Self-pay | Admitting: Physician Assistant

## 2024-10-20 ENCOUNTER — Other Ambulatory Visit: Payer: Self-pay

## 2024-10-20 DIAGNOSIS — Z992 Dependence on renal dialysis: Secondary | ICD-10-CM

## 2024-10-20 NOTE — Progress Notes (Signed)
 SDW call attempt, patient states he is going to reschedule surgery. Alan Glance, RN at Dr. Russ office made aware.

## 2024-10-25 ENCOUNTER — Encounter (HOSPITAL_COMMUNITY): Admission: RE | Payer: Self-pay | Source: Home / Self Care

## 2024-10-25 ENCOUNTER — Ambulatory Visit (HOSPITAL_COMMUNITY): Admission: RE | Admit: 2024-10-25 | Source: Home / Self Care | Admitting: Surgery

## 2024-10-25 SURGERY — ARTERIOVENOUS (AV) FISTULA CREATION
Anesthesia: Choice | Laterality: Right

## 2025-01-16 ENCOUNTER — Ambulatory Visit: Admitting: Internal Medicine
